# Patient Record
Sex: Male | Born: 1945 | Race: Black or African American | Hispanic: No | Smoking: Former smoker
Health system: Southern US, Community
[De-identification: ages and names within clinical notes are randomized; demographics above are authoritative.]

## PROBLEM LIST (undated history)

## (undated) DIAGNOSIS — R7303 Prediabetes: Secondary | ICD-10-CM

## (undated) DIAGNOSIS — F1910 Other psychoactive substance abuse, uncomplicated: Secondary | ICD-10-CM

## (undated) DIAGNOSIS — R0902 Hypoxemia: Secondary | ICD-10-CM

## (undated) DIAGNOSIS — E785 Hyperlipidemia, unspecified: Secondary | ICD-10-CM

## (undated) DIAGNOSIS — J449 Chronic obstructive pulmonary disease, unspecified: Secondary | ICD-10-CM

## (undated) DIAGNOSIS — I4891 Unspecified atrial fibrillation: Secondary | ICD-10-CM

## (undated) DIAGNOSIS — K219 Gastro-esophageal reflux disease without esophagitis: Secondary | ICD-10-CM

## (undated) DIAGNOSIS — N189 Chronic kidney disease, unspecified: Secondary | ICD-10-CM

## (undated) DIAGNOSIS — D649 Anemia, unspecified: Secondary | ICD-10-CM

## (undated) DIAGNOSIS — J189 Pneumonia, unspecified organism: Secondary | ICD-10-CM

## (undated) DIAGNOSIS — G4700 Insomnia, unspecified: Secondary | ICD-10-CM

## (undated) DIAGNOSIS — I10 Essential (primary) hypertension: Secondary | ICD-10-CM

## (undated) DIAGNOSIS — F419 Anxiety disorder, unspecified: Secondary | ICD-10-CM

## (undated) HISTORY — DX: Hyperlipidemia, unspecified: E78.5

## (undated) HISTORY — DX: Chronic obstructive pulmonary disease, unspecified: J44.9

## (undated) HISTORY — DX: Hypoxemia: R09.02

## (undated) HISTORY — DX: Gastro-esophageal reflux disease without esophagitis: K21.9

## (undated) HISTORY — DX: Chronic kidney disease, unspecified: N18.9

## (undated) HISTORY — DX: Insomnia, unspecified: G47.00

## (undated) HISTORY — DX: Unspecified atrial fibrillation: I48.91

## (undated) HISTORY — DX: Essential (primary) hypertension: I10

---
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
## 1968-07-19 DIAGNOSIS — F1910 Other psychoactive substance abuse, uncomplicated: Secondary | ICD-10-CM

## 1968-07-19 HISTORY — DX: Other psychoactive substance abuse, uncomplicated: F19.10

## 1997-10-24 ENCOUNTER — Encounter: Admission: RE | Admit: 1997-10-24 | Discharge: 1997-10-24 | Payer: Self-pay | Admitting: Sports Medicine

## 1997-11-19 ENCOUNTER — Encounter: Admission: RE | Admit: 1997-11-19 | Discharge: 1997-11-19 | Payer: Self-pay | Admitting: Family Medicine

## 1998-01-23 ENCOUNTER — Encounter: Admission: RE | Admit: 1998-01-23 | Discharge: 1998-01-23 | Payer: Self-pay | Admitting: Family Medicine

## 1998-06-05 ENCOUNTER — Encounter: Admission: RE | Admit: 1998-06-05 | Discharge: 1998-06-05 | Payer: Self-pay | Admitting: Family Medicine

## 2003-05-27 ENCOUNTER — Encounter: Admission: RE | Admit: 2003-05-27 | Discharge: 2003-05-27 | Payer: Self-pay | Admitting: Family Medicine

## 2003-06-24 ENCOUNTER — Ambulatory Visit (HOSPITAL_COMMUNITY): Admission: RE | Admit: 2003-06-24 | Discharge: 2003-06-24 | Payer: Self-pay | Admitting: Family Medicine

## 2003-06-24 ENCOUNTER — Encounter: Admission: RE | Admit: 2003-06-24 | Discharge: 2003-06-24 | Payer: Self-pay | Admitting: Family Medicine

## 2003-07-26 ENCOUNTER — Encounter: Admission: RE | Admit: 2003-07-26 | Discharge: 2003-07-26 | Payer: Self-pay | Admitting: Family Medicine

## 2003-08-26 ENCOUNTER — Encounter: Admission: RE | Admit: 2003-08-26 | Discharge: 2003-08-26 | Payer: Self-pay | Admitting: Family Medicine

## 2003-09-20 ENCOUNTER — Encounter: Payer: Self-pay | Admitting: Cardiology

## 2003-09-20 ENCOUNTER — Ambulatory Visit (HOSPITAL_COMMUNITY): Admission: RE | Admit: 2003-09-20 | Discharge: 2003-09-20 | Payer: Self-pay | Admitting: Family Medicine

## 2004-06-15 ENCOUNTER — Ambulatory Visit: Payer: Self-pay | Admitting: Family Medicine

## 2005-05-24 ENCOUNTER — Ambulatory Visit: Payer: Self-pay | Admitting: Family Medicine

## 2006-05-27 ENCOUNTER — Ambulatory Visit: Payer: Self-pay | Admitting: Family Medicine

## 2006-07-08 ENCOUNTER — Ambulatory Visit: Payer: Self-pay | Admitting: Family Medicine

## 2006-09-15 DIAGNOSIS — J439 Emphysema, unspecified: Secondary | ICD-10-CM | POA: Insufficient documentation

## 2006-09-15 DIAGNOSIS — J449 Chronic obstructive pulmonary disease, unspecified: Secondary | ICD-10-CM | POA: Insufficient documentation

## 2006-09-15 DIAGNOSIS — I10 Essential (primary) hypertension: Secondary | ICD-10-CM | POA: Insufficient documentation

## 2007-06-12 ENCOUNTER — Ambulatory Visit: Payer: Self-pay | Admitting: Family Medicine

## 2007-06-12 LAB — CONVERTED CEMR LAB
ALT: 21 U/L
AST: 25 U/L
Albumin: 4.7 g/dL
Alkaline Phosphatase: 47 U/L
BUN: 18 mg/dL
CO2: 26 meq/L
Calcium: 9.7 mg/dL
Chloride: 102 meq/L
Cholesterol: 136 mg/dL
Creatinine, Ser: 1.15 mg/dL
Glucose, Bld: 94 mg/dL
HDL: 46 mg/dL
LDL Cholesterol: 50 mg/dL
PSA: 3.38 ng/mL
Potassium: 3.9 meq/L
Sodium: 140 meq/L
Total Bilirubin: 0.7 mg/dL
Total CHOL/HDL Ratio: 3
Total Protein: 7.4 g/dL
Triglycerides: 201 mg/dL — ABNORMAL HIGH
VLDL: 40 mg/dL

## 2007-07-25 ENCOUNTER — Ambulatory Visit: Payer: Self-pay | Admitting: Family Medicine

## 2008-05-06 ENCOUNTER — Ambulatory Visit: Payer: Self-pay | Admitting: Family Medicine

## 2008-05-07 ENCOUNTER — Encounter: Payer: Self-pay | Admitting: Family Medicine

## 2008-05-07 LAB — CONVERTED CEMR LAB
BUN: 19 mg/dL (ref 6–23)
CO2: 23 meq/L (ref 19–32)
Calcium: 9.5 mg/dL (ref 8.4–10.5)
Chloride: 101 meq/L (ref 96–112)
Creatinine, Ser: 1.23 mg/dL (ref 0.40–1.50)
Glucose, Bld: 104 mg/dL — ABNORMAL HIGH (ref 70–99)
PSA: 3.95 ng/mL (ref 0.10–4.00)
Potassium: 4.4 meq/L (ref 3.5–5.3)
Sodium: 139 meq/L (ref 135–145)

## 2008-12-05 ENCOUNTER — Telehealth: Payer: Self-pay | Admitting: Family Medicine

## 2008-12-05 ENCOUNTER — Ambulatory Visit: Payer: Self-pay | Admitting: Family Medicine

## 2008-12-23 ENCOUNTER — Telehealth: Payer: Self-pay | Admitting: Family Medicine

## 2008-12-25 ENCOUNTER — Telehealth: Payer: Self-pay | Admitting: *Deleted

## 2008-12-25 ENCOUNTER — Telehealth: Payer: Self-pay | Admitting: Family Medicine

## 2009-01-14 ENCOUNTER — Ambulatory Visit: Payer: Self-pay | Admitting: Family Medicine

## 2009-01-14 DIAGNOSIS — J328 Other chronic sinusitis: Secondary | ICD-10-CM | POA: Insufficient documentation

## 2009-02-18 ENCOUNTER — Telehealth: Payer: Self-pay | Admitting: *Deleted

## 2009-02-28 ENCOUNTER — Ambulatory Visit (HOSPITAL_COMMUNITY): Admission: RE | Admit: 2009-02-28 | Discharge: 2009-02-28 | Payer: Self-pay | Admitting: Family Medicine

## 2009-02-28 ENCOUNTER — Ambulatory Visit: Payer: Self-pay | Admitting: Family Medicine

## 2009-03-03 ENCOUNTER — Telehealth (INDEPENDENT_AMBULATORY_CARE_PROVIDER_SITE_OTHER): Payer: Self-pay | Admitting: *Deleted

## 2009-05-06 ENCOUNTER — Encounter: Payer: Self-pay | Admitting: Family Medicine

## 2009-05-20 ENCOUNTER — Telehealth: Payer: Self-pay | Admitting: Family Medicine

## 2009-05-20 ENCOUNTER — Ambulatory Visit: Payer: Self-pay | Admitting: Family Medicine

## 2009-08-20 ENCOUNTER — Encounter (INDEPENDENT_AMBULATORY_CARE_PROVIDER_SITE_OTHER): Payer: Self-pay | Admitting: *Deleted

## 2009-08-20 ENCOUNTER — Ambulatory Visit: Payer: Self-pay | Admitting: Family Medicine

## 2009-08-20 DIAGNOSIS — R3912 Poor urinary stream: Secondary | ICD-10-CM

## 2009-08-20 DIAGNOSIS — N401 Enlarged prostate with lower urinary tract symptoms: Secondary | ICD-10-CM | POA: Insufficient documentation

## 2009-08-20 DIAGNOSIS — K219 Gastro-esophageal reflux disease without esophagitis: Secondary | ICD-10-CM | POA: Insufficient documentation

## 2009-08-20 LAB — CONVERTED CEMR LAB
BUN: 18 mg/dL (ref 6–23)
CO2: 24 meq/L (ref 19–32)
Calcium: 9.1 mg/dL (ref 8.4–10.5)
Chloride: 99 meq/L (ref 96–112)
Creatinine, Ser: 1.35 mg/dL (ref 0.40–1.50)
Glucose, Bld: 87 mg/dL (ref 70–99)
PSA: 3.63 ng/mL (ref 0.10–4.00)
Potassium: 4 meq/L (ref 3.5–5.3)
Sodium: 138 meq/L (ref 135–145)

## 2009-08-21 ENCOUNTER — Ambulatory Visit: Payer: Self-pay | Admitting: Gastroenterology

## 2009-09-03 ENCOUNTER — Ambulatory Visit: Payer: Self-pay | Admitting: Gastroenterology

## 2009-10-08 ENCOUNTER — Ambulatory Visit: Payer: Self-pay | Admitting: Family Medicine

## 2009-11-27 ENCOUNTER — Ambulatory Visit: Payer: Self-pay | Admitting: Family Medicine

## 2009-11-27 ENCOUNTER — Encounter: Admission: RE | Admit: 2009-11-27 | Discharge: 2009-11-27 | Payer: Self-pay | Admitting: Family Medicine

## 2009-12-03 ENCOUNTER — Ambulatory Visit: Payer: Self-pay | Admitting: Family Medicine

## 2009-12-03 DIAGNOSIS — N529 Male erectile dysfunction, unspecified: Secondary | ICD-10-CM | POA: Insufficient documentation

## 2010-04-17 ENCOUNTER — Ambulatory Visit: Payer: Self-pay | Admitting: Family Medicine

## 2010-04-17 DIAGNOSIS — A5903 Trichomonal cystitis and urethritis: Secondary | ICD-10-CM | POA: Insufficient documentation

## 2010-04-22 LAB — CONVERTED CEMR LAB
Chlamydia, Swab/Urine, PCR: NEGATIVE
GC Probe Amp, Urine: NEGATIVE

## 2010-05-07 ENCOUNTER — Ambulatory Visit: Payer: Self-pay | Admitting: Family Medicine

## 2010-05-07 DIAGNOSIS — J441 Chronic obstructive pulmonary disease with (acute) exacerbation: Secondary | ICD-10-CM

## 2010-05-07 HISTORY — DX: Chronic obstructive pulmonary disease with (acute) exacerbation: J44.1

## 2010-07-24 ENCOUNTER — Encounter (INDEPENDENT_AMBULATORY_CARE_PROVIDER_SITE_OTHER): Payer: Self-pay | Admitting: *Deleted

## 2010-07-24 ENCOUNTER — Ambulatory Visit
Admission: RE | Admit: 2010-07-24 | Discharge: 2010-07-24 | Payer: Self-pay | Source: Home / Self Care | Attending: Family Medicine | Admitting: Family Medicine

## 2010-08-20 NOTE — Assessment & Plan Note (Signed)
Summary: pulse ox for qualification/tlb  Nurse Visit patient came in office with O2 @ 2 L/min. O2 was removed and pulse ox was 94% at rest .  Then patient walked around office a short time and O2 dropped to 86%.. O2 was replaced at 2 L/min and pulse ox  came up to 98%. Theresia Lo RN  July 24, 2010 9:01 AM   Allergies: 1)  Doxycycline  Orders Added: 1)  Pulse Oximetry- [facility] [94760]  Appended Document: pulse ox for qualification/tlb note  and form faxed to [facility].

## 2010-08-20 NOTE — Assessment & Plan Note (Signed)
Summary: cough,tcb   Vital Signs:  Patient profile:   65 year old male Height:      68 inches Weight:      159.4 pounds BMI:     24.32 O2 Sat:      97 % on Room air Temp:     98.2 degrees F oral Pulse rate:   105 / minute BP sitting:   132 / 89  (left arm) Cuff size:   regular  Vitals Entered By: Dedra Skeens CMA, (February 28, 2009 10:06 AM)  O2 Flow:  Room air   CC: cough, Hypertension Management Is Patient Diabetic? No Pain Assessment Patient in pain? yes     Location: right side with cough Intensity: 3   CC:  cough and Hypertension Management.  History of Present Illness: Continued cough.  Has not yet seen ENT.  Reviewed records and no recent CXR.  On ACE but was on for 8 months prior to cough signs.  No smoking for 5 years  Also having more shortness of breath.  Seems like emphysema getting worse.  Hypertension History:      Positive major cardiovascular risk factors include male age 22 years old or older and hypertension.  Negative major cardiovascular risk factors include non-tobacco-user status.    Habits & Providers  Alcohol-Tobacco-Diet     Tobacco Status: quit > 6 months  Current Medications (verified): 1)  Advair Diskus 250-50 Mcg/dose Misc (Fluticasone-Salmeterol) .... Inhale 1 Puff As Directed Twice A Day 2)  Ventolin Hfa 108 (90 Base) Mcg/act Aers (Albuterol Sulfate) .... 2 Puffs Q4h As Needed Rescue Shortness of Breath. 3)  Bayer Aspirin 325 Mg Tabs (Aspirin) .... Take 1 Tablet By Mouth Once A Day 4)  Hydrochlorothiazide 25 Mg Tabs (Hydrochlorothiazide) .... Take 1 Tablet By Mouth Once A Day 5)  Spiriva Handihaler 18 Mcg Caps (Tiotropium Bromide Monohydrate) .Marland Kitchen.. 1 Puff Once A Day 6)  Lisinopril 10 Mg  Tabs (Lisinopril) .... Take 1 Tab By Mouth Daily 7)  Loratadine 10 Mg Tabs (Loratadine) .... One By Mouth Daily  Allergies (verified): 1)  Doxycycline  Past History:  Past medical, surgical, family and social histories (including risk factors)  reviewed, and no changes noted (except as noted below).  Past Medical History: Reviewed history from 07/25/2007 and no changes required. COPD  Past Surgical History: Reviewed history from 09/15/2006 and no changes required. echocardiogram, nl EF - 10/02/2003, EKG - 06/27/2003  Family History: Reviewed history from 09/15/2006 and no changes required. HBP, colon CA, CVA, DM  Social History: Reviewed history from 09/15/2006 and no changes required. 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 Lungs:  Increased AP diameter.  Good air movement.  Scattered exp wheeze.   Impression & Recommendations:  Problem # 1:  COUGH (ICD-786.2) Will get ent referral to check vocal cords.  Also, hold ACE x 1 week to make sure not causing cough. Orders: CXR- 2view (CXR) [facility]- Est Level  3 (56213)  Problem # 2:  HYPERTENSION, BENIGN SYSTEMIC (ICD-401.1) At goal, hold lisinopril His updated medication list for this problem includes:    Hydrochlorothiazide 25 Mg Tabs (Hydrochlorothiazide) .Marland Kitchen... Take 1 tablet by mouth once a day    Lisinopril 10 Mg Tabs (Lisinopril) .Marland Kitchen... Take 1 tab by mouth daily  Problem # 3:  COPD (ICD-496) Assessment: Deteriorated Consider increased Rx.  Await CXR and results of ENT referral His updated medication list for this problem includes:    Advair Diskus 250-50 Mcg/dose Misc (Fluticasone-salmeterol) ..... Inhale 1 puff as directed twice a day    Ventolin Hfa 108 (90 Base) Mcg/act Aers (Albuterol sulfate) .Marland Kitchen... 2 puffs q4h as needed rescue shortness of breath.    Spiriva Handihaler 18 Mcg Caps (Tiotropium bromide monohydrate) .Marland Kitchen... 1 puff once a day  Orders: Pulse Oximetry- [facility] (94760) CXR- 2view (CXR) [facility]- Est  Level  3 (16109)  Complete Medication List: 1)  Advair Diskus 250-50 Mcg/dose Misc (Fluticasone-salmeterol) .... Inhale 1 puff as directed twice a day 2)  Ventolin Hfa 108 (90 Base) Mcg/act Aers (Albuterol sulfate) .... 2 puffs q4h as needed rescue shortness of breath. 3)  Bayer Aspirin 325 Mg Tabs (Aspirin) .... Take 1 tablet by mouth once a day 4)  Hydrochlorothiazide 25 Mg Tabs (Hydrochlorothiazide) .... Take 1 tablet by mouth once a day 5)  Spiriva Handihaler 18 Mcg Caps (Tiotropium bromide monohydrate) .Marland Kitchen.. 1 puff once a day 6)  Lisinopril 10 Mg Tabs (Lisinopril) .... Take 1 tab by mouth daily 7)  Loratadine 10 Mg Tabs (Loratadine) .... One by mouth daily  Hypertension Assessment/Plan:      The patient's hypertensive risk group is category B: At least one risk factor (excluding diabetes) with no target organ damage.  His calculated 10 year risk of coronary heart disease is 7 %.  Today's blood pressure is 132/89.     Patient Instructions: 1)  Make sure you go see the ENT doctor.  2)  I will call with the results of your CXR 3)  Stop taking your lisinopril for one week.  If cough is not any better, start taking it again.  If cough is better, call me.     Prevention & Chronic Care Immunizations   Influenza vaccine: given  (05/06/2008)   Influenza vaccine due: 05/06/2009    Tetanus booster: 07/20/2003: Done.   Tetanus booster due: 07/19/2013    Pneumococcal vaccine: Done.  (06/19/2003)   Pneumococcal vaccine due: None    H. zoster vaccine: Not documented  Colorectal Screening   Hemoccult: Not documented    Colonoscopy: Not documented  Other Screening   PSA: 3.95  (05/06/2008)   PSA due due: 05/06/2009   Smoking status: quit > 6 months  (02/28/2009)  Lipids   Total Cholesterol: 136  (06/12/2007)   LDL: 50  (06/12/2007)   LDL Direct: Not documented   HDL: 46  (06/12/2007)   Triglycerides: 201  (06/12/2007)  Hypertension   Last Blood Pressure: 132 / 89   (02/28/2009)   Serum creatinine: 1.23  (05/06/2008)   Serum potassium 4.4  (05/06/2008)    Hypertension flowsheet reviewed?: Yes   Progress toward BP goal: At goal  Self-Management Support :    Hypertension self-management support: Not documented

## 2010-08-20 NOTE — Letter (Signed)
Summary: *Referral Letter  [facility]  [address]   [place], [place] [address]   Phone: [phone]  Fax: [phone]    05/07/2008  Thank you in advance for agreeing to see my patient:  Michael Robertson [address] [place], [place]  [address]  Phone: [phone]  Reason for Referral: Rising PSA.  PSA=3.38 on 06/12/07.  PSA=3.95 on 05/06/08.  Concerned by rate of rise.  No bladder obstructive signs.  Creat=1.23  Procedures Requested:   Current Medical Problems: 1)  SPECIAL SCREENING MALIGNANT NEOPLASM OF PROSTATE (ICD-V76.44) 2)  HEALTH MAINTENANCE EXAM (ICD-V70.0) 3)  HYPERTENSION, BENIGN SYSTEMIC (ICD-401.1) 4)  COPD (ICD-496)   Current Medications: 1)  ADVAIR DISKUS 250-50 MCG/DOSE MISC (FLUTICASONE-SALMETEROL) Inhale 1 puff as directed twice a day 2)  VENTOLIN HFA 108 (90 BASE) MCG/ACT AERS (ALBUTEROL SULFATE) 2 puffs q4h as needed rescue Shortness of breath. 3)  BAYER ASPIRIN 325 MG TABS (ASPIRIN) Take 1 tablet by mouth once a day 4)  HYDROCHLOROTHIAZIDE 25 MG TABS (HYDROCHLOROTHIAZIDE) Take 1 tablet by mouth once a day 5)  SPIRIVA HANDIHALER 18 MCG CAPS (TIOTROPIUM BROMIDE MONOHYDRATE) 1 puff once a day 6)  LISINOPRIL 10 MG  TABS (LISINOPRIL) Take 1 tab by mouth daily   Thank you again for agreeing to see our patient; please contact us if you have any further questions or need additional information.  Sincerely,  Doralee Albino MD

## 2010-08-20 NOTE — Assessment & Plan Note (Signed)
Summary: f/u and flu shot,df   Vital Signs:  Patient Profile:   65 Years Old Male Weight:      148.2 pounds O2 Sat:      97 % O2 treatment:    Room Air Temp:     97.5 degrees F oral Pulse rate:   70 / minute BP sitting:   159 / 88  (left arm)  Pt. in pain?   no  Vitals Entered By: Arlyss Repress CMA, (May 06, 2008 10:01 AM)              Is Patient Diabetic? No     Serial Vital Signs/Assessments:                                PEF    PreRx  PostRx Time      O2 Sat  O2 Type     L/min  L/min  L/min   By 11:00 AM  88  %   Room air                          THEKLA SLADE CMA,  Last Flu Vaccine:  Fluvax 3+ (06/12/2007 1:42:39 PM) Flu Vaccine Result Date:  05/06/2008 Flu Vaccine Result:  given Flu Vaccine Next Due:  1 yr   Chief Complaint:  1.)discuss disability form 2.) flu shot 3.)refill all meds 4.)f/up inhaled chemicals 2 weeks ago.  History of Present Illness: Prevention.  Needs shingles shot and Colon cancer screen.  He will probably get through [place] and give me copies of results  Now 100% disabled 2nd to COPD  2 weeks ago was exposed to chlorax and toilet bowel cleaner.  Symptomatically back to normal  Still quit smoking  BP high today.  Has been running a bit high when checks at home.  Will add lisinopril      Current Allergies: No known allergies       Physical Exam  General:     Well-developed,well-nourished,in no acute distress; alert,appropriate and cooperative throughout examination BP noted Lungs:     prolonged exp phase.  No wheez Heart:     Normal rate and regular rhythm. S1 and S2 normal without gallop, murmur, click, rub or other extra sounds. Extremities:     No clubbing, cyanosis, edema, or deformity noted with normal full range of motion of all joints.      Impression & Recommendations:  Problem # 1:  HYPERTENSION, BENIGN SYSTEMIC (ICD-401.1) Assessment: Deteriorated Add lisinopril His updated medication list for this problem  includes:    Hydrochlorothiazide 25 Mg Tabs (Hydrochlorothiazide) .Marland Kitchen... Take 1 tablet by mouth once a day    Lisinopril 10 Mg Tabs (Lisinopril) .Marland Kitchen... Take 1 tab by mouth daily  Orders: [facility]- Est  Level 4 (16109) Basic Met-[facility] ([address]454-09811)   Problem # 2:  COPD (ICD-496)  His updated medication list for this problem includes:    Advair Diskus 250-50 Mcg/dose Misc (Fluticasone-salmeterol) ..... Inhale 1 puff as directed twice a day    Ventolin Hfa 108 (90 Base) Mcg/act Aers (Albuterol sulfate) .Marland Kitchen... 2 puffs q4h as needed rescue shortness of breath.    Spiriva Handihaler 18 Mcg Caps (Tiotropium bromide monohydrate) .Marland Kitchen... 1 puff once a day  Orders: [facility]- Est  Level 4 (99214) Pulse Oximetry- [facility] (94760)   Problem # 3:  HEALTH MAINTENANCE EXAM (ICD-V70.0)  Orders: [facility]- Est  Level 4 (91478) PSA-[facility] (  [phone])   Complete Medication List: 1)  Advair Diskus 250-50 Mcg/dose Misc (Fluticasone-salmeterol) .... Inhale 1 puff as directed twice a day 2)  Ventolin Hfa 108 (90 Base) Mcg/act Aers (Albuterol sulfate) .... 2 puffs q4h as needed rescue shortness of breath. 3)  Bayer Aspirin 325 Mg Tabs (Aspirin) .... Take 1 tablet by mouth once a day 4)  Hydrochlorothiazide 25 Mg Tabs (Hydrochlorothiazide) .... Take 1 tablet by mouth once a day 5)  Spiriva Handihaler 18 Mcg Caps (Tiotropium bromide monohydrate) .Marland Kitchen.. 1 puff once a day 6)  Lisinopril 10 Mg Tabs (Lisinopril) .... Take 1 tab by mouth daily  Other Orders: Influenza Vaccine NON MCR (30865)   Patient Instructions: 1)  Check with insurance/VA about two preention things 2)  1. Zostavax=Shingles vaccine 3)  2. Colon cancer screen.  4)  3. You received the regular flu shot today.  Still need the H1N1 shot   Prescriptions: LISINOPRIL 10 MG  TABS (LISINOPRIL) Take 1 tab by mouth daily  #30 x 12   Entered and Authorized by:   Doralee Albino MD   Signed by:   Doralee Albino MD on 05/06/2008   Method used:   Electronically to         [facility]* (retail)       [address]       [place], [place]  [address]       Ph: [phone]       Fax: [phone]   RxID:   [id number] SPIRIVA HANDIHALER 18 MCG CAPS (TIOTROPIUM BROMIDE MONOHYDRATE) 1 puff once a day  #1 x 12   Entered and Authorized by:   Doralee Albino MD   Signed by:   Doralee Albino MD on 05/06/2008   Method used:   Electronically to        [facility]* (retail)       [address]       [place], [place]  [address]       Ph: [phone]       Fax: [phone]   RxID:   [id number] HYDROCHLOROTHIAZIDE 25 MG TABS (HYDROCHLOROTHIAZIDE) Take 1 tablet by mouth once a day  #30 x 12   Entered and Authorized by:   Doralee Albino MD   Signed by:   Doralee Albino MD on 05/06/2008   Method used:   Electronically to        [facility]* (retail)       [address]       [place], [place]  [address]       Ph: [phone]       Fax: [phone]   RxID:   [id number] VENTOLIN HFA 108 (90 BASE) MCG/ACT AERS (ALBUTEROL SULFATE) 2 puffs q4h as needed rescue Shortness of breath.  #1 x 12   Entered and Authorized by:   Doralee Albino MD   Signed by:   Doralee Albino MD on 05/06/2008   Method used:   Electronically to        [facility]* (retail)       [address]       [place], [place]  [address]       Ph: [phone]       Fax: [phone]   RxID:   [id number] ADVAIR DISKUS 250-50 MCG/DOSE MISC (FLUTICASONE-SALMETEROL) Inhale 1 puff as directed twice a day  #1 x 12   Entered and Authorized by:   Doralee Albino MD   Signed by:   Doralee Albino MD on 05/06/2008  Method used:   Electronically to        [facility]* (retail)       [address]       [place], [place]  [address]       Ph: [phone]       Fax: [phone]   RxID:   [id number]  ]  Influenza Vaccine    Vaccine Type: Fluvax Non-MCR    Site: left deltoid    Mfr: GlaxoSmithKline    Dose: 0.25 ml    Route: IM    Given by: Arlyss Repress CMA,    Exp.  Date: 01/15/2009    Lot #: [id number]    VIS given: 02/09/07 version given May 06, 2008.  Flu Vaccine Consent Questions    Do you have a history of severe allergic reactions to this vaccine? no    Any prior history of allergic reactions to egg and/or gelatin? no    Do you have a sensitivity to the preservative Thimersol? no    Do you have a past history of Guillan-Barre Syndrome? no    Do you currently have an acute febrile illness? no    Have you ever had a severe reaction to latex? no    Vaccine information given and explained to patient? yes  Vaccine Consent Questions for Influenza    Do you have a history of severe allergic reactions to this vaccine? no    Any prior history of allergic reactions to egg and/or gelatin? no    Do you currently have an acute febrile illness? no    Have you ever had a severe reaction to latex? no    Patient is moderately or severely ill? no    Vaccine information given and explained to patient? yes

## 2010-08-20 NOTE — Progress Notes (Signed)
Summary: ENT appt  Phone Note Call from Patient Call back at Home Phone [phone]   Caller: Patient Summary of Call: is asking about when his ENT appt is. Initial call taken by: De Nurse,  March 03, 2009 8:44 AM  Follow-up for Phone Call        Lm on vm advising ENT appt can not be scheduled with [facility] until insurance is verified and will pay for visit.  Insurance card sent via fax to [facility] attn: Marcelino Duster to verify insurance will pay. She will contact me to setup appt and pt will be notified as soon as appt has been set. Follow-up by: Dedra Skeens CMA,,  March 03, 2009 3:50 PM

## 2010-08-20 NOTE — Assessment & Plan Note (Signed)
Summary: cough,df   Vital Signs:  Patient profile:   65 year old male Height:      68 inches Weight:      160.7 pounds BMI:     24.52 O2 Sat:      95 % on 1 L/min Temp:     98.1 degrees F oral Pulse rate:   98 / minute BP sitting:   134 / 80  (left arm) Cuff size:   regular  Vitals Entered By: Gladstone Pih (October 08, 2009 1:29 PM)  O2 Flow:  1 L/min  Serial Vital Signs/Assessments:                                PEF    PreRx  PostRx Time      O2 Sat  O2 Type     L/min  L/min  L/min   By 1:59 PM   83  %   Room air                          Gladstone Pih  Comments: 1:59 PM after exercise By: Gladstone Pih   CC: F/U COPD and cough Is Patient Diabetic? No Pain Assessment Patient in pain? no        CC:  F/U COPD and cough.  History of Present Illness: Cough.  Seems to be related to reflux.  Noted indigestion and urping brings on cough - especially after meals and certain foods (e.g. caffiene containing.)  On omeprozole - which controls the heart burn but he still feels food regurgitating.    Breathing seems generally worse.  He particularly struggles in the mornings and the evenings.  No able to be as active as he used to.  He has checked at home pulse ox and during times he is SOB his pulse ox will drop to 85%.    Habits & Providers  Alcohol-Tobacco-Diet     Tobacco Status: quit     Tobacco Counseling: to quit use of tobacco products     Year Quit: 2006  Current Medications (verified): 1)  Advair Diskus 250-50 Mcg/dose Misc (Fluticasone-Salmeterol) .... Inhale 1 Puff As Directed Twice A Day 2)  Ventolin Hfa 108 (90 Base) Mcg/act Aers (Albuterol Sulfate) .... 2 Puffs Q4h As Needed Rescue Shortness of Breath. 3)  Bayer Aspirin 325 Mg Tabs (Aspirin) .... Take 1 Tablet By Mouth Once A Day 4)  Hydrochlorothiazide 25 Mg Tabs (Hydrochlorothiazide) .... Take 1 Tablet By Mouth Once A Day 5)  Spiriva Handihaler 18 Mcg Caps (Tiotropium Bromide Monohydrate) .Marland Kitchen.. 1 Puff Once A  Day 6)  Lisinopril 10 Mg  Tabs (Lisinopril) .... Take 1 Tab By Mouth Daily 7)  Omeprazole 40 Mg Cpdr (Omeprazole) .... One By Mouth Daily 8)  Tamsulosin Hcl 0.4 Mg Caps (Tamsulosin Hcl) .... One By Mouth Daily (For Prostate) 9)  Metoclopramide Hcl 10 Mg Tabs (Metoclopramide Hcl) .... One By Mouth Qac and Qhs 10)  Oxygen .... 2 Liters Per Minute Per Nasal Cannula  Allergies (verified): 1)  Doxycycline  Past History:  Past medical, surgical, family and social histories (including risk factors) reviewed, and no changes noted (except as noted below).  Past Medical History: Reviewed history from 07/25/2007 and no changes required. COPD  Past Surgical History: Reviewed history from 09/15/2006 and no changes required. echocardiogram, nl EF - 10/02/2003, EKG - 06/27/2003  Family History: Reviewed history from 09/15/2006 and  no changes required. HBP, colon CA, CVA, DM  Social History: Reviewed history from 09/15/2006 and no changes required. 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    Physical Exam  General:  Well-developed,well-nourished,in no acute distress; alert,appropriate and cooperative throughout examination.  Not short of breath at rest.    PULSE OX POST EXERCISE DROPPED TO 83%  Mouth:  Oral mucosa and oropharynx without lesions or exudates.  Teeth in good repair. Neck:  No deformities, masses, or tenderness noted. Lungs:  No wheeze or crackles.  Increase AP diameter and decreased vital capacity. Heart:  Normal rate and regular rhythm. S1 and S2 normal without gallop, murmur, click, rub or other extra sounds. Abdomen:  Bowel sounds positive,abdomen soft and non-tender without masses, organomegaly or hernias noted. Extremities:  no edema   Impression & Recommendations:  Problem # 1:  COPD  (ICD-496) Assessment Deteriorated Needs home O2 for low pulse ox with exercise. His updated medication list for this problem includes:    Advair Diskus 250-50 Mcg/dose Misc (Fluticasone-salmeterol) ..... Inhale 1 puff as directed twice a day    Ventolin Hfa 108 (90 Base) Mcg/act Aers (Albuterol sulfate) .Marland Kitchen... 2 puffs q4h as needed rescue shortness of breath.    Spiriva Handihaler 18 Mcg Caps (Tiotropium bromide monohydrate) .Marland Kitchen... 1 puff once a day  Orders: Pulse Oximetry- [facility] (94760) [facility]- Est  Level 4 (99214)  Problem # 2:  G E R D (ICD-530.81) Add reglan His updated medication list for this problem includes:    Omeprazole 40 Mg Cpdr (Omeprazole) ..... One by mouth daily  Orders: [facility]- Est  Level 4 (96045)  Problem # 3:  HYPERTENSION, BENIGN SYSTEMIC (ICD-401.1) Assessment: Improved continue same rx His updated medication list for this problem includes:    Hydrochlorothiazide 25 Mg Tabs (Hydrochlorothiazide) .Marland Kitchen... Take 1 tablet by mouth once a day    Lisinopril 10 Mg Tabs (Lisinopril) .Marland Kitchen... Take 1 tab by mouth daily  Orders: [facility]- Est  Level 4 (99214)  Complete Medication List: 1)  Advair Diskus 250-50 Mcg/dose Misc (Fluticasone-salmeterol) .... Inhale 1 puff as directed twice a day 2)  Ventolin Hfa 108 (90 Base) Mcg/act Aers (Albuterol sulfate) .... 2 puffs q4h as needed rescue shortness of breath. 3)  Bayer Aspirin 325 Mg Tabs (Aspirin) .... Take 1 tablet by mouth once a day 4)  Hydrochlorothiazide 25 Mg Tabs (Hydrochlorothiazide) .... Take 1 tablet by mouth once a day 5)  Spiriva Handihaler 18 Mcg Caps (Tiotropium bromide monohydrate) .Marland Kitchen.. 1 puff once a day 6)  Lisinopril 10 Mg Tabs (Lisinopril) .... Take 1 tab by mouth daily 7)  Omeprazole 40 Mg Cpdr (Omeprazole) .... One by mouth daily 8)  Tamsulosin Hcl 0.4 Mg Caps (Tamsulosin hcl) .... One by mouth daily (for prostate) 9)  Metoclopramide Hcl 10 Mg Tabs (Metoclopramide hcl) .... One by mouth qac and qhs 10)  Oxygen  .... 2  liters per minute per nasal cannula  Other Orders: Pneumococcal Vaccine (40981) Admin 1st Vaccine (19147)  Patient Instructions: 1)  The new prescription is for reflux.  Start taking it four times a day.  Try to wean yourself down to only twice a day. 2)  Get oxygen prescription through [facility]. 3)  See me in three months if you are doing well - sooner if doing poorly. Prescriptions: METOCLOPRAMIDE HCL 10 MG  TABS (METOCLOPRAMIDE HCL) one by mouth qac and qhs  #120 x 12   Entered and Authorized by:   Doralee Albino MD   Signed by:   Doralee Albino MD on 10/08/2009   Method used:   Electronically to        [facility] [phone]* (retail)       [address]       [place], [place]  [address]       Ph: [phone]       Fax: [phone]   RxID:   [id number]    Prevention & Chronic Care Immunizations   Influenza vaccine: Fluvax Non-MCR  (05/20/2009)   Influenza vaccine due: 03/19/2010    Tetanus booster: 07/20/2003: Done.   Tetanus booster due: 07/19/2013    Pneumococcal vaccine: Pneumovax  (10/08/2009)   Pneumococcal vaccine due: None    H. zoster vaccine: Not documented  Colorectal Screening   Hemoccult: Not documented   Hemoccult action/deferral: Not indicated  (08/20/2009)    Colonoscopy: DONE  (09/03/2009)   Colonoscopy action/deferral: GI Referral  (08/20/2009)   Colonoscopy due: 08/2014  Other Screening   PSA: 3.63  (08/20/2009)   PSA action/deferral: Discussed-PSA requested  (08/20/2009)   PSA due due: 05/06/2009   Smoking status: quit  (10/08/2009)  Lipids   Total Cholesterol: 136  (06/12/2007)   LDL: 50  (06/12/2007)   LDL Direct: Not documented   HDL: 46  (06/12/2007)   Triglycerides: 201  (06/12/2007)  Hypertension   Last Blood Pressure: 134 / 80  (10/08/2009)   Serum creatinine: 1.35  (08/20/2009)   BMP action: Ordered   Serum potassium 4.0  (08/20/2009)    Hypertension flowsheet reviewed?: Yes   Progress toward BP goal:  At goal  Self-Management Support :    Hypertension self-management support: Not documented   Nursing Instructions: Give Pneumovax today     Orders Added: 1)  Pulse Oximetry- [facility] [94760] 2)  [facility]- Est  Level 4 [24401] 3)  Pneumococcal Vaccine [90732] 4)  Admin 1st Vaccine [02725]    Immunizations Administered:  Pneumonia Vaccine:    Vaccine Type: Pneumovax    Site: right deltoid    Mfr: Merck    Dose: 0.5 ml    Route: IM    Given by: Gladstone Pih    Exp. Date: 04/02/2011    Lot #: [id number]    VIS given: 02/14/96 version given October 08, 2009.    Physician counseled: yes

## 2010-08-20 NOTE — Miscellaneous (Signed)
Summary: [facility]  Clinical Lists Changes  Medications: Added new medication of MOVIPREP 100 GM  SOLR (PEG-KCL-NACL-NASULF-NA ASC-C) As per prep instructions. - Signed Rx of MOVIPREP 100 GM  SOLR (PEG-KCL-NACL-NASULF-NA ASC-C) As per prep instructions.;  #1 x 0;  Signed;  Entered by: Ezra Sites RN;  Authorized by: Mardella Layman MD [facility];  Method used: Electronically to [facility] [phone]*, [address], [place], [place]  [address], Ph: [phone], Fax: [phone] Observations: Added new observation of ALLERGY REV: Done (08/21/2009 13:19)    Prescriptions: MOVIPREP 100 GM  SOLR (PEG-KCL-NACL-NASULF-NA ASC-C) As per prep instructions.  #1 x 0   Entered by:   Ezra Sites RN   Authorized by:   Mardella Layman MD [facility]   Signed by:   Ezra Sites RN on 08/21/2009   Method used:   Electronically to        [facility] [phone]* (retail)       [address]       [place], [place]  [address]       Ph: [phone]       Fax: [phone]   RxID:   [id number]

## 2010-08-20 NOTE — Letter (Signed)
Summary: [facility] Instructions  [facility]  [address] [place], [place] [address]   Phone: [phone]  Fax: [phone]       Michael Robertson    1945-09-19    MRN: [id number]        Procedure Day Dorna Bloom:  Wednesday  09/03/09     Arrival Time:  10:00am     Procedure Time:  11:00am     Location of Procedure:                    _X _  [facility] (4th Floor)                       PREPARATION FOR COLONOSCOPY WITH MOVIPREP   Starting 5 days prior to your procedure  Friday 02/11  do not eat nuts, seeds, popcorn, corn, beans, peas,  salads, or any raw vegetables.  Do not take any fiber supplements (e.g. Metamucil, Citrucel, and Benefiber).  THE DAY BEFORE YOUR PROCEDURE         DATE:  02/15  DAY: Tuesday  1.  Drink clear liquids the entire day-NO SOLID FOOD  2.  Do not drink anything colored red or purple.  Avoid juices with pulp.  No orange juice.  3.  Drink at least 64 oz. (8 glasses) of fluid/clear liquids during the day to prevent dehydration and help the prep work efficiently.  CLEAR LIQUIDS INCLUDE: Water Jello Ice Popsicles Tea (sugar ok, no milk/cream) Powdered fruit flavored drinks Coffee (sugar ok, no milk/cream) Gatorade Juice: apple, white grape, white cranberry  Lemonade Clear bullion, consomm, broth Carbonated beverages (any kind) Strained chicken noodle soup Hard Candy                             4.  In the morning, mix first dose of MoviPrep solution:    Empty 1 Pouch A and 1 Pouch B into the disposable container    Add lukewarm drinking water to the top line of the container. Mix to dissolve    Refrigerate (mixed solution should be used within 24 hrs)  5.  Begin drinking the prep at 5:00 p.m. The MoviPrep container is divided by 4 marks.   Every 15 minutes drink the solution down to the next mark (approximately 8 oz) until the full liter is complete.   6.  Follow completed prep with 16 oz of clear liquid of your choice  (Nothing red or purple).  Continue to drink clear liquids until bedtime.  7.  Before going to bed, mix second dose of MoviPrep solution:    Empty 1 Pouch A and 1 Pouch B into the disposable container    Add lukewarm drinking water to the top line of the container. Mix to dissolve    Refrigerate  THE DAY OF YOUR PROCEDURE      DATE:  02/16  DAY: Wednesday  Beginning at  6:00 a.m. (5 hours before procedure):         1. Every 15 minutes, drink the solution down to the next mark (approx 8 oz) until the full liter is complete.  2. Follow completed prep with 16 oz. of clear liquid of your choice.    3. You may drink clear liquids until  9:00am  (2 HOURS BEFORE PROCEDURE).   MEDICATION INSTRUCTIONS  Unless otherwise instructed, you should take regular prescription medications with a small sip of water  as early as possible the morning of your procedure.    Additional medication instructions: Hold HCTZ morning of procedure         OTHER INSTRUCTIONS  You will need a responsible adult at least 66 years of age to accompany you and drive you home.   This person must remain in the waiting room during your procedure.  Wear loose fitting clothing that is easily removed.  Leave jewelry and other valuables at home.  However, you may wish to bring a book to read or  an iPod/MP3 player to listen to music as you wait for your procedure to start.  Remove all body piercing jewelry and leave at home.  Total time from sign-in until discharge is approximately 2-3 hours.  You should go home directly after your procedure and rest.  You can resume normal activities the  day after your procedure.  The day of your procedure you should not:   Drive   Make legal decisions   Operate machinery   Drink alcohol   Return to work  You will receive specific instructions about eating, activities and medications before you leave.    The above instructions have been reviewed and explained  to me by   Ezra Sites RN  August 21, 2009 1:58 PM     I fully understand and can verbalize these instructions _____________________________ Date _________

## 2010-08-20 NOTE — Procedures (Signed)
Summary: Colonoscopy  Patient: Michael Robertson Note: All result statuses are Final unless otherwise noted.  Tests: (1) Colonoscopy (COL)   COL Colonoscopy           DONE (C)     [facility]     [address]     [place], [place]  [address]           COLONOSCOPY PROCEDURE REPORT           PATIENT:  Michael Robertson  MR#:  [id number]     BIRTHDATE:  [birth year], 63 yrs. old  GENDER:  male           ENDOSCOPIST:  Vania Rea. Jarold Motto, MD, [facility]     Referred by:  Doralee Albino, M.D.           PROCEDURE DATE:  09/03/2009     PROCEDURE:  Higher-risk screening colonoscopy G0105           ASA CLASS:  Class II     INDICATIONS:  family history of colon cancer, constipation           MEDICATIONS:   Fentanyl 50 mcg IV, Versed 6 mg IV           DESCRIPTION OF PROCEDURE:   After the risks benefits and     alternatives of the procedure were thoroughly explained, informed     consent was obtained.  Digital rectal exam was performed and     revealed no abnormalities.   The LB [id number] endoscope     was introduced through the anus and advanced to the cecum, which     was identified by both the appendix and ileocecal valve, without     limitations.  The quality of the prep was excellent, using     MoviPrep.  The instrument was then slowly withdrawn as the colon     was fully examined.     <<PROCEDUREIMAGES>>           FINDINGS:  No polyps or cancers were seen.  This was otherwise a     normal examination of the colon. RECTUM CLOSELY EXAMENED AND     APPEARS NORMAL.   Retroflexed views in the rectum revealed no     abnormalities.    The scope was then withdrawn from the patient     and the procedure completed.           COMPLICATIONS:  None           ENDOSCOPIC IMPRESSION:     1) No polyps or cancers     2) Otherwise normal examination     RECOMMENDATIONS:     1) High fiber diet with liberal fluid intake.           REPEAT EXAM:  In 5 year(s) for Colonoscopy.        ______________________________     Vania Rea. Jarold Motto, MD, [facility]           CC:           n.     REVISED:  09/03/2009 03:18 PM     eSIGNED:   Vania Rea. Eleasha Cataldo at 09/03/2009 03:18 PM           Mindi Junker, [id number]  Note: An exclamation mark (!) indicates a result that was not dispersed into the flowsheet. Document Creation Date: 09/03/2009 3:18 PM _______________________________________________________________________  (1) Order result status: Final Collection or observation date-time: 09/03/2009 11:41 Requested date-time:  Receipt date-time:  Reported date-time:  Referring Physician:   Ordering Physician: Sheryn Bison [phone]) Specimen Source:  Source: Launa Grill Order Number: [id number] Lab site:   Appended Document: Colonoscopy    Clinical Lists Changes  Observations: Added new observation of COLONNXTDUE: 08/2014 (09/03/2009 15:20)      Appended Document: Colonoscopy     Procedures Next Due Date:    Colonoscopy: 09/2014

## 2010-08-20 NOTE — Progress Notes (Signed)
Summary: Triage  Phone Note Call from Patient Call back at [facility] Phone [phone]   Summary of Call: has real bad uncontrolable cough wants to be seen asap. Initial call taken by: Clydell Hakim,  Dec 05, 2008 11:44 AM  Follow-up for Phone Call        c/o cough x 2 weeks. wants to be seen today. will see Dr. Karn Pickler at 1:30 Follow-up by: Golden Circle RN,  Dec 05, 2008 11:49 AM  Additional Follow-up for Phone Call Additional follow up Details #1::        noted and agree Additional Follow-up by: Doralee Albino MD,  Dec 06, 2008 8:57 AM

## 2010-08-20 NOTE — Assessment & Plan Note (Signed)
Summary: cough,df   Vital Signs:  Patient profile:   65 year old male Height:      68 inches Weight:      158 pounds BMI:     24.11 BSA:     1.85 Temp:     97.8 degrees F Pulse rate:   108 / minute BP sitting:   150 / 82  Vitals Entered By: Jone Baseman CMA (January 14, 2009 8:46 AM) CC: cough x 2 months Pain Assessment Patient in pain? no        CC:  cough x 2 months.  History of Present Illness: Cough x 2 months.  Hx of springtime allergies.  Allegra helped now out.  Feels like cough is tickle in throat.  Also has posterior sinus drainage.  Cough worse at night.  Occaisional hoarseness after bad coughing spell.  Quit tobacco 5 years ago  Habits & Providers  Alcohol-Tobacco-Diet     Tobacco Status: quit     Year Quit: 5 years  Allergies: 1)  Doxycycline  Social History: Smoking Status:  quit  Physical Exam  General:  Well-developed,well-nourished,in no acute distress; alert,appropriate and cooperative throughout examination Mouth:  Oral mucosa and oropharynx without lesions or exudates.  Teeth are carrious. Neck:  No deformities, masses, or tenderness noted. 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 1 tab by mouth two times a day as needed cough His updated medication list for this problem includes:    Augmentin 875-125 Mg Tabs (Amoxicillin-pot clavulanate) .Marland Kitchen... 1 by mouth 2 times daily  Orders: [facility]- Est Level  3 (86578)  Complete Medication List: 1)  Advair Diskus 250-50 Mcg/dose Misc (Fluticasone-salmeterol) .... Inhale 1 puff as directed twice a day 2)  Ventolin Hfa 108 (90 Base) Mcg/act Aers (Albuterol sulfate) .... 2 puffs q4h as needed rescue shortness of breath. 3)  Bayer Aspirin 325  Mg Tabs (Aspirin) .... Take 1 tablet by mouth once a day 4)  Hydrochlorothiazide 25 Mg Tabs (Hydrochlorothiazide) .... Take 1 tablet by mouth once a day 5)  Spiriva Handihaler 18 Mcg Caps (Tiotropium bromide monohydrate) .Marland Kitchen.. 1 puff once a day 6)  Lisinopril 10 Mg Tabs (Lisinopril) .... Take 1 tab by mouth daily 7)  Loratadine 10 Mg Tabs (Loratadine) .... One by mouth daily 8)  Augmentin 875-125 Mg Tabs (Amoxicillin-pot clavulanate) .Marland Kitchen.. 1 by mouth 2 times daily  Patient Instructions: 1)  Two new meds 2)  1. Is antibiotic for sinus infection 3)  2. I want you taking an antihistamine daily for allergies. 4)  3. If this doesn't clear up the cough, you will need to see an Ear, Nose and Throat doctor.   Prescriptions: AUGMENTIN 875-125 MG TABS (AMOXICILLIN-POT CLAVULANATE) 1 by mouth 2 times daily  #20 x 3   Entered and Authorized by:   Doralee Albino MD   Signed by:   Doralee Albino MD on 01/14/2009   Method used:   Electronically to        [facility] [phone]* (retail)       [address]       [place], [place]  [address]       Ph: [phone]       Fax: [phone]   RxID:  [id number] LORATADINE 10 MG TABS (LORATADINE) one by mouth daily  #30 x 12   Entered and Authorized by:   Doralee Albino MD   Signed by:   Doralee Albino MD on 01/14/2009   Method used:   Electronically to        [facility] [phone]* (retail)       [address]       [place], [place]  [address]       Ph: [phone]       Fax: [phone]   RxID:   [id number]   Flex Sig Next Due:  Not Indicated Last PSA Result:  3.95 (05/06/2008 8:18:00 PM) PSA Result Date:  05/06/2008 PSA Result:  3.95 PSA Next Due:  1 yr

## 2010-08-20 NOTE — Assessment & Plan Note (Signed)
Summary: f/u eo   Vital Signs:  Patient profile:   65 year old male Height:      68 inches Weight:      163.5 pounds BMI:     24.95 Temp:     97.7 degrees F oral Pulse rate:   112 / minute BP sitting:   128 / 79  (left arm) Cuff size:   regular  Vitals Entered By: Gladstone Pih (Dec 03, 2009 1:43 PM) CC: F/U Is Patient Diabetic? No Pain Assessment Patient in pain? no        Primary Care Provider:  Doralee Albino MD  CC:  F/U.  History of Present Illness: Recovering nicely from lung infection.  Still has some hoarseness.  Knows to be rechecked if prolonged hoarseness. Also has questions about ED.  Would like to try meds.  Knows to wear O2 during intercourse.  Habits & Providers  Alcohol-Tobacco-Diet     Tobacco Status: never  Current Medications (verified): 1)  Advair Diskus 250-50 Mcg/dose Misc (Fluticasone-Salmeterol) .... Inhale 1 Puff As Directed Twice A Day 2)  Ventolin Hfa 108 (90 Base) Mcg/act Aers (Albuterol Sulfate) .... 2 Puffs Q4h As Needed Rescue Shortness of Breath. 3)  Bayer Aspirin 325 Mg Tabs (Aspirin) .... Take 1 Tablet By Mouth Once A Day 4)  Hydrochlorothiazide 25 Mg Tabs (Hydrochlorothiazide) .... Take 1 Tablet By Mouth Once A Day 5)  Spiriva Handihaler 18 Mcg Caps (Tiotropium Bromide Monohydrate) .Marland Kitchen.. 1 Puff Once A Day 6)  Lisinopril 10 Mg  Tabs (Lisinopril) .... Take 1 Tab By Mouth Daily 7)  Omeprazole 40 Mg Cpdr (Omeprazole) .... One By Mouth Daily 8)  Tamsulosin Hcl 0.4 Mg Caps (Tamsulosin Hcl) .... One By Mouth Daily (For Prostate) 9)  Metoclopramide Hcl 10 Mg Tabs (Metoclopramide Hcl) .... One By Mouth Qac and Qhs 10)  Oxygen .... 2 Liters Per Minute Per Nasal Cannula 11)  Viagra 50 Mg Tabs (Sildenafil Citrate) .... One By Mouth 1/2 To 1 Hour Prior To Activity  Allergies (verified): 1)  Doxycycline  Past History:  Past medical, surgical, family and social histories (including risk factors) reviewed, and no changes noted (except as noted  below).  Past Medical History: Reviewed history from 07/25/2007 and no changes required. COPD  Past Surgical History: Reviewed history from 09/15/2006 and no changes required. echocardiogram, nl EF - 10/02/2003, EKG - 06/27/2003  Family History: Reviewed history from 09/15/2006 and no changes required. HBP, colon CA, CVA, DM  Social History: Reviewed history from 09/15/2006 and no changes required. 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  His baseline Heart:  Normal rate and regular rhythm. S1 and S2 normal without gallop, murmur, click, rub or other extra sounds.   Impression & Recommendations:  Problem # 1:  COPD (ICD-496) Assessment Improved  His updated medication list for this problem includes:    Advair Diskus 250-50 Mcg/dose Misc (Fluticasone-salmeterol) ..... Inhale 1 puff as directed twice a day    Ventolin Hfa 108 (90 Base) Mcg/act Aers (Albuterol sulfate) .Marland Kitchen... 2 puffs q4h as needed rescue shortness of breath.    Spiriva Handihaler 18 Mcg Caps (Tiotropium bromide monohydrate) .Marland Kitchen... 1 puff once a day  Problem # 2:  ERECTILE DYSFUNCTION (VWU-981.19)  His updated  medication list for this problem includes:    Viagra 50 Mg Tabs (Sildenafil citrate) ..... One by mouth 1/2 to 1 hour prior to activity  Complete Medication List: 1)  Advair Diskus 250-50 Mcg/dose Misc (Fluticasone-salmeterol) .... Inhale 1 puff as directed twice a day 2)  Ventolin Hfa 108 (90 Base) Mcg/act Aers (Albuterol sulfate) .... 2 puffs q4h as needed rescue shortness of breath. 3)  Bayer Aspirin 325 Mg Tabs (Aspirin) .... Take 1 tablet by mouth once a day 4)  Hydrochlorothiazide 25 Mg Tabs (Hydrochlorothiazide) .... Take  1 tablet by mouth once a day 5)  Spiriva Handihaler 18 Mcg Caps (Tiotropium bromide monohydrate) .Marland Kitchen.. 1 puff once a day 6)  Lisinopril 10 Mg Tabs (Lisinopril) .... Take 1 tab by mouth daily 7)  Omeprazole 40 Mg Cpdr (Omeprazole) .... One by mouth daily 8)  Tamsulosin Hcl 0.4 Mg Caps (Tamsulosin hcl) .... One by mouth daily (for prostate) 9)  Metoclopramide Hcl 10 Mg Tabs (Metoclopramide hcl) .... One by mouth qac and qhs 10)  Oxygen  .... 2 liters per minute per nasal cannula 11)  Viagra 50 Mg Tabs (Sildenafil citrate) .... One by mouth 1/2 to 1 hour prior to activity  Patient Instructions: 1)  Please schedule a follow-up appointment in 4 months .  2)  Call to let me know how the new medicine works. Prescriptions: VIAGRA 50 MG TABS (SILDENAFIL CITRATE) one by mouth 1/2 to 1 hour prior to activity  #6 x 12   Entered and Authorized by:   Doralee Albino MD   Signed by:   Doralee Albino MD on 12/03/2009   Method used:   Electronically to        [facility] [phone]* (retail)       [address]       [place], [place]  [address]       Ph: [phone]       Fax: [phone]   RxID:   [id number]    Prevention & Chronic Care Immunizations   Influenza vaccine: Fluvax Non-MCR  (05/20/2009)   Influenza vaccine due: 03/19/2010    Tetanus booster: 07/20/2003: Done.   Tetanus booster due: 07/19/2013    Pneumococcal vaccine: Pneumovax  (10/08/2009)   Pneumococcal vaccine due: None    H. zoster vaccine: Not documented  Colorectal Screening   Hemoccult: Not documented   Hemoccult action/deferral: Not indicated  (08/20/2009)    Colonoscopy: DONE  (09/03/2009)   Colonoscopy action/deferral: GI Referral  (08/20/2009)   Colonoscopy due: 08/2014  Other Screening   PSA: 3.63  (08/20/2009)   PSA action/deferral: Discussed-PSA requested  (08/20/2009)   PSA due due: 05/06/2009   Smoking status: never  (12/03/2009)  Lipids   Total Cholesterol: 136  (06/12/2007)   LDL: 50   (06/12/2007)   LDL Direct: Not documented   HDL: 46  (06/12/2007)   Triglycerides: 201  (06/12/2007)  Hypertension   Last Blood Pressure: 128 / 79  (12/03/2009)   Serum creatinine: 1.35  (08/20/2009)   BMP action: Ordered   Serum potassium 4.0  (08/20/2009)    Hypertension flowsheet reviewed?: Yes   Progress toward BP goal: At goal  Self-Management Support :   Personal Goals (by the next clinic visit) :      Personal blood pressure goal: 140/90  (12/03/2009)   Hypertension self-management support: Written self-care plan  (12/03/2009)   Hypertension self-care plan printed.

## 2010-08-20 NOTE — Miscellaneous (Signed)
  Clinical Lists Changes  Observations: Added new observation of HTN PROGRESS: At goal (04/17/2010 8:36) Added new observation of HTN FSREVIEW: Yes (04/17/2010 8:36) Added new observation of PSADUE: 08/20/2010 (04/17/2010 8:36) Added new observation of DM PROGRESS: N/A (04/17/2010 8:36) Added new observation of DM FSREVIEW: N/A (04/17/2010 8:36) Added new observation of LIPID PROGRS: N/A (04/17/2010 8:36) Added new observation of LIPID FSREVW: N/A (04/17/2010 8:36)      Prevention & Chronic Care Immunizations   Influenza vaccine: Fluvax Non-MCR  (05/20/2009)   Influenza vaccine due: 03/19/2010    Tetanus booster: 07/20/2003: Done.   Tetanus booster due: 07/19/2013    Pneumococcal vaccine: Pneumovax  (10/08/2009)   Pneumococcal vaccine due: None    H. zoster vaccine: Not documented  Colorectal Screening   Hemoccult: Not documented   Hemoccult action/deferral: Not indicated  (08/20/2009)    Colonoscopy: DONE  (09/03/2009)   Colonoscopy action/deferral: GI Referral  (08/20/2009)   Colonoscopy due: 08/2014  Other Screening   PSA: 3.63  (08/20/2009)   PSA action/deferral: Discussed-PSA requested  (08/20/2009)   PSA due due: 08/20/2010   Smoking status: never  (12/03/2009)  Lipids   Total Cholesterol: 136  (06/12/2007)   LDL: 50  (06/12/2007)   LDL Direct: Not documented   HDL: 46  (06/12/2007)   Triglycerides: 201  (06/12/2007)  Hypertension   Last Blood Pressure: 128 / 79  (12/03/2009)   Serum creatinine: 1.35  (08/20/2009)   BMP action: Ordered   Serum potassium 4.0  (08/20/2009)    Hypertension flowsheet reviewed?: Yes   Progress toward BP goal: At goal  Self-Management Support :   Personal Goals (by the next clinic visit) :      Personal blood pressure goal: 140/90  (12/03/2009)   Hypertension self-management support: Written self-care plan  (12/03/2009)

## 2010-08-20 NOTE — Progress Notes (Signed)
Summary: refill  Phone Note Refill Request Call back at Home Phone [phone] Message from:  Patient  Refills Requested: Medication #1:  ADVAIR DISKUS 250-50 MCG/DOSE MISC Inhale 1 puff as directed twice a day  Medication #2:  SPIRIVA HANDIHALER 18 MCG CAPS 1 puff once a day [facility] [address]  Initial call taken by: De Nurse,  May 20, 2009 11:11 AM  Follow-up for Phone Call        last ov 02/28/09. to pcp Follow-up by: Golden Circle RN,  May 20, 2009 11:15 AM  Additional Follow-up for Phone Call Additional follow up Details #1::        Done Additional Follow-up by: Doralee Albino MD,  May 20, 2009 12:25 PM    New/Updated Medications: ADVAIR DISKUS 250-50 MCG/DOSE MISC (FLUTICASONE-SALMETEROL) Inhale 1 puff as directed twice a day Prescriptions: SPIRIVA HANDIHALER 18 MCG CAPS (TIOTROPIUM BROMIDE MONOHYDRATE) 1 puff once a day  #1 x 12   Entered and Authorized by:   Doralee Albino MD   Signed by:   Doralee Albino MD on 05/20/2009   Method used:   Electronically to        [facility] [phone]* (retail)       [address]       [place], [place]  [address]       Ph: [phone]       Fax: [phone]   RxID:   [id number] ADVAIR DISKUS 250-50 MCG/DOSE MISC (FLUTICASONE-SALMETEROL) Inhale 1 puff as directed twice a day  #1 x 12   Entered and Authorized by:   Doralee Albino MD   Signed by:   Doralee Albino MD on 05/20/2009   Method used:   Electronically to        [facility] [phone]* (retail)       [address]       [place], [place]  [address]       Ph: [phone]       Fax: [phone]   RxID:   [id number]

## 2010-08-20 NOTE — Assessment & Plan Note (Signed)
Summary: f/u,df   Vital Signs:  Patient profile:   65 year old male Height:      68 inches Weight:      159 pounds BMI:     24.26 O2 Sat:      94 % on 2 L/min Temp:     98.1 degrees F oral Pulse rate:   106 / minute BP sitting:   153 / 89  (right arm) Cuff size:   regular  Vitals Entered By: Tessie Fass CMA (April 17, 2010 2:23 PM)  O2 Flow:  2 L/min  Serial Vital Signs/Assessments:  Time      Position  BP       Pulse  Resp  Temp     By                     138/85                         Doralee Albino MD  CC: F/U   Primary Care Provider:  Doralee Albino MD  CC:  F/U.  History of Present Illness: Exposed to trich.  Wants to be checked for other STDs.  Does have some burning with urination.  No discharge.  Current Medications (verified): 1)  Advair Diskus 250-50 Mcg/dose Misc (Fluticasone-Salmeterol) .... Inhale 1 Puff As Directed Twice A Day 2)  Ventolin Hfa 108 (90 Base) Mcg/act Aers (Albuterol Sulfate) .... 2 Puffs Q4h As Needed Rescue Shortness of Breath. 3)  Bayer Aspirin 325 Mg Tabs (Aspirin) .... Take 1 Tablet By Mouth Once A Day 4)  Hydrochlorothiazide 25 Mg Tabs (Hydrochlorothiazide) .... Take 1 Tablet By Mouth Once A Day 5)  Spiriva Handihaler 18 Mcg Caps (Tiotropium Bromide Monohydrate) .Marland Kitchen.. 1 Puff Once A Day 6)  Lisinopril 10 Mg  Tabs (Lisinopril) .... Take 1 Tab By Mouth Daily 7)  Omeprazole 40 Mg Cpdr (Omeprazole) .... One By Mouth Daily 8)  Tamsulosin Hcl 0.4 Mg Caps (Tamsulosin Hcl) .... One By Mouth Daily (For Prostate) 9)  Metoclopramide Hcl 10 Mg Tabs (Metoclopramide Hcl) .... One By Mouth Qac and Qhs 10)  Oxygen .... 2 Liters Per Minute Per Nasal Cannula 11)  Viagra 50 Mg Tabs (Sildenafil Citrate) .... One By Mouth 1/2 To 1 Hour Prior To Activity 12)  Metronidazole 500 Mg Tabs (Metronidazole) .... Take All Four Tabs All At Once For Self and Partner.  Allergies (verified): 1)  Doxycycline  Physical Exam  General:   Well-developed,well-nourished,in no acute distress; alert,appropriate and cooperative throughout examination Genitalia:  Testes bilaterally descended without nodularity, tenderness or masses. No scrotal masses or lesions. No penis lesions or urethral discharge.   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 Inhale 1 puff as directed twice a day 2)  Ventolin Hfa 108 (90 Base) Mcg/act Aers (Albuterol sulfate) .... 2 puffs q4h as needed rescue shortness of breath. 3)  Bayer Aspirin 325 Mg Tabs (Aspirin) .... Take 1 tablet by mouth once a day 4)  Hydrochlorothiazide 25 Mg Tabs (Hydrochlorothiazide) .... Take 1 tablet by mouth once a day 5)  Spiriva Handihaler 18 Mcg Caps (Tiotropium bromide monohydrate) .Marland Kitchen.. 1 puff once a day 6)  Lisinopril 10 Mg Tabs (Lisinopril) .... Take 1 tab by mouth daily 7)  Omeprazole 40  Mg Cpdr (Omeprazole) .... One by mouth daily 8)  Tamsulosin Hcl 0.4 Mg Caps (Tamsulosin hcl) .... One by mouth daily (for prostate) 9)  Metoclopramide Hcl 10 Mg Tabs (Metoclopramide hcl) .... One by mouth qac and qhs 10)  Oxygen  .... 2 liters per minute per nasal cannula 11)  Viagra 50 Mg Tabs (Sildenafil citrate) .... One by mouth 1/2 to 1 hour prior to activity 12)  Metronidazole 500 Mg Tabs (Metronidazole) .... Take all four tabs all at once for self and partner.  Other Orders: Influenza Vaccine MCR ([address]) Prescriptions: SPIRIVA HANDIHALER 18 MCG CAPS (TIOTROPIUM BROMIDE MONOHYDRATE) 1 puff once a day  #1 x 12   Entered and Authorized by:   Doralee Albino MD   Signed by:   Doralee Albino MD on 04/17/2010   Method used:   Electronically to        [facility] [phone]* (retail)       [address]        [place], [place]  [address]       Ph: [phone]       Fax: [phone]   RxID:   [id number] VENTOLIN HFA 108 (90 BASE) MCG/ACT AERS (ALBUTEROL SULFATE) 2 puffs q4h as needed rescue Shortness of breath.  #1 x 12   Entered and Authorized by:   Doralee Albino MD   Signed by:   Doralee Albino MD on 04/17/2010   Method used:   Electronically to        [facility] [phone]* (retail)       [address]       [place], [place]  [address]       Ph: [phone]       Fax: [phone]   RxID:   [id number] ADVAIR DISKUS 250-50 MCG/DOSE MISC (FLUTICASONE-SALMETEROL) Inhale 1 puff as directed twice a day  #1 x 12   Entered and Authorized by:   Doralee Albino MD   Signed by:   Doralee Albino MD on 04/17/2010   Method used:   Electronically to        [facility] [phone]* (retail)       [address]       [place], [place]  [address]       Ph: [phone]       Fax: [phone]   RxID:   [id number] METRONIDAZOLE 500 MG TABS (METRONIDAZOLE) Take all four tabs all at once for self and partner.  #8 x 0   Entered and Authorized by:   Doralee Albino MD   Signed by:   Doralee Albino MD on 04/17/2010   Method used:   Electronically to        [facility] [phone]* (retail)       [address]       [place], [place]  [address]       Ph: [phone]       Fax: [phone]   RxID:   [id number]    Prevention & Chronic Care Immunizations   Influenza vaccine: Fluvax MCR  (04/17/2010)   Influenza vaccine due: 03/19/2010    Tetanus booster: 07/20/2003: Done.   Tetanus booster due: 07/19/2013    Pneumococcal vaccine: Pneumovax  (10/08/2009)   Pneumococcal vaccine due: None    H. zoster vaccine: Not documented  Colorectal Screening   Hemoccult: Not documented   Hemoccult action/deferral: Not indicated  (08/20/2009)    Colonoscopy: DONE  (09/03/2009)   Colonoscopy action/deferral: GI Referral  (08/20/2009)  Colonoscopy due: 08/2014  Other Screening    PSA: 3.63  (08/20/2009)   PSA action/deferral: Discussed-PSA requested  (08/20/2009)   PSA due due: 08/20/2010   Smoking status: never  (12/03/2009)  Lipids   Total Cholesterol: 136  (06/12/2007)   LDL: 50  (06/12/2007)   LDL Direct: Not documented   HDL: 46  (06/12/2007)   Triglycerides: 201  (06/12/2007)  Hypertension   Last Blood Pressure: 153 / 89  (04/17/2010)   Serum creatinine: 1.35  (08/20/2009)   BMP action: Ordered   Serum potassium 4.0  (08/20/2009)  Self-Management Support :   Personal Goals (by the next clinic visit) :      Personal blood pressure goal: 140/90  (12/03/2009)   Hypertension self-management support: Written self-care plan  (12/03/2009)   Nursing Instructions: Give Flu vaccine today    Immunizations Administered:  Influenza Vaccine # 1:    Vaccine Type: Fluvax MCR    Site: right deltoid    Mfr: GlaxoSmithKline    Dose: 0.5 ml    Route: IM    Given by: Tessie Fass CMA    Exp. Date: 01/13/2011    Lot #: [id number]    VIS given: 02/10/10 version given April 17, 2010.  Flu Vaccine Consent Questions:    Do you have a history of severe allergic reactions to this vaccine? no    Any prior history of allergic reactions to egg and/or gelatin? no    Do you have a sensitivity to the preservative Thimersol? no    Do you have a past history of Guillan-Barre Syndrome? no    Do you currently have an acute febrile illness? no    Have you ever had a severe reaction to latex? no    Vaccine information given and explained to patient? yes

## 2010-08-20 NOTE — Progress Notes (Signed)
Summary: triage  Phone Note Call from Patient Call back at Home Phone [phone]   Caller: Patient Summary of Call: would like to be worked in for persistant cough. Initial call taken by: Clydell Hakim,  February 18, 2009 8:40 AM  Follow-up for Phone Call        per last OV notes in June, he is to see ENT for this cough if it does not improve. he is ready to see one. message to pcp to write order Follow-up by: Golden Circle RN,  February 18, 2009 8:54 AM  Additional Follow-up for Phone Call Additional follow up Details #1::        Order for ENT referral entered and referral letter written. Additional Follow-up by: Doralee Albino MD,  February 18, 2009 10:23 AM  New Problems: COUGH [phone])   Additional Follow-up for Phone Call Additional follow up Details #2::    called [facility] [phone] to make an appt. they want to know which [employer] he has. if it is Prime, he will need preauthorization. if it is "for Life" they can make the appt. called pt & he will check his card/call the number to find out which he has & then call us back Follow-up by: Golden Circle RN,  February 18, 2009 11:17 AM  New Problems: COUGH [phone])

## 2010-08-20 NOTE — Assessment & Plan Note (Signed)
Summary: worsening dyspnea and cough concerning for COPD exacerbation   Vital Signs:  Patient profile:   64 year old male Weight:      163 pounds O2 Sat:      96 % on 2.5 L/min Temp:     98.1 degrees F oral Pulse rate:   91 / minute BP sitting:   128 / 81  (left arm)  Vitals Entered By: Arlyss Repress CMA, (Nov 27, 2009 8:52 AM)  O2 Flow:  2.5 L/min CC: thinks he has a lung infection Is Patient Diabetic? No Pain Assessment Patient in pain? no        Primary Care Provider:  Doralee Albino MD  CC:  thinks he has a lung infection.  History of Present Illness: 65yo M concerned that he may have a lung infection  Pt states that symptoms began 1 wk ago.  Reports inc oxygen demand (now on 2.5L from 2L) and inc cough and sputum production.  No chest pain.  Unsure of fevers.  Reports taking all of his medications and has quit smoking >5 years.    Habits & Providers  Alcohol-Tobacco-Diet     Tobacco Status: quit     Year Quit: 2006  Current Medications (verified): 1)  Advair Diskus 250-50 Mcg/dose Misc (Fluticasone-Salmeterol) .... Inhale 1 Puff As Directed Twice A Day 2)  Ventolin Hfa 108 (90 Base) Mcg/act Aers (Albuterol Sulfate) .... 2 Puffs Q4h As Needed Rescue Shortness of Breath. 3)  Bayer Aspirin 325 Mg Tabs (Aspirin) .... Take 1 Tablet By Mouth Once A Day 4)  Hydrochlorothiazide 25 Mg Tabs (Hydrochlorothiazide) .... Take 1 Tablet By Mouth Once A Day 5)  Spiriva Handihaler 18 Mcg Caps (Tiotropium Bromide Monohydrate) .Marland Kitchen.. 1 Puff Once A Day 6)  Lisinopril 10 Mg  Tabs (Lisinopril) .... Take 1 Tab By Mouth Daily 7)  Omeprazole 40 Mg Cpdr (Omeprazole) .... One By Mouth Daily 8)  Tamsulosin Hcl 0.4 Mg Caps (Tamsulosin Hcl) .... One By Mouth Daily (For Prostate) 9)  Metoclopramide Hcl 10 Mg Tabs (Metoclopramide Hcl) .... One By Mouth Qac and Qhs 10)  Oxygen .... 2 Liters Per Minute Per Nasal Cannula  Allergies (verified): 1)  Doxycycline  Past History:  Past Medical  History: Reviewed history from 07/25/2007 and no changes required. COPD  Review of Systems      See HPI  Physical Exam  General:  VS Reviewed. Non ill appearing, NAD.  Currently on 2.5L [place].    Mouth:  moist mucus membranes; mildly erythematous posterior pharynx Neck:  supple full ROM Lungs:  dec BS throughout; mild expiratory wheezing worse in the left lung fields; no crackles Heart:  distant heart tones Skin:  nl color and turgor Cervical Nodes:  no LAD   Impression & Recommendations:  Problem # 1:  DYSPNEA (ICD-786.05) Assessment Deteriorated Hx and exam concerning for early COPD exacerbation. Will obtain xray to look for infiltrates. Will decide upon appropriate abx after reviewing the xray results.  Will start on Prednisone x 10 days as well. f/u with Dr. [name] in 1 week to reassess Consider repeating PFTs as we do not have any results in our files.  Orders: Radiology other (Radiology Other) [facility]- Est Level  3 ([address])  Complete Medication List: 1)  Advair Diskus 250-50 Mcg/dose Misc (Fluticasone-salmeterol) .... Inhale 1 puff as directed twice a day 2)  Ventolin Hfa 108 (90 Base) Mcg/act Aers (Albuterol sulfate) .... 2 puffs q4h as needed rescue shortness of breath. 3)  Bayer Aspirin 325  Mg Tabs (Aspirin) .... Take 1 tablet by mouth once a day 4)  Hydrochlorothiazide 25 Mg Tabs (Hydrochlorothiazide) .... Take 1 tablet by mouth once a day 5)  Spiriva Handihaler 18 Mcg Caps (Tiotropium bromide monohydrate) .Marland Kitchen.. 1 puff once a day 6)  Lisinopril 10 Mg Tabs (Lisinopril) .... Take 1 tab by mouth daily 7)  Omeprazole 40 Mg Cpdr (Omeprazole) .... One by mouth daily 8)  Tamsulosin Hcl 0.4 Mg Caps (Tamsulosin hcl) .... One by mouth daily (for prostate) 9)  Metoclopramide Hcl 10 Mg Tabs (Metoclopramide hcl) .... One by mouth qac and qhs 10)  Oxygen  .... 2 liters per minute per nasal cannula  Other Orders: Pulse Oximetry- [facility] (81[address])  Patient Instructions: 1)  Go get the  xray today and I will call you with the results and depending on the xray, I will send in a prescription for both an antibiotic and steroid. 2)  Follow up with Dr. [name] in 1 week to reassess.

## 2010-08-20 NOTE — Progress Notes (Signed)
Summary: Triage  Phone Note Call from Patient Call back at Home Phone [phone]   Caller: friend-[place] Summary of Call: came in last week for cough still not better.  The meds given are not helping. Initial call taken by: Clydell Hakim,  December 23, 2008 10:20 AM  Follow-up for Phone Call        c.o persistant cough. not helped by the meds rx'd. uses [facility] [address]. wants to kno wif there is something else that can be called in for him. message to pcp Follow-up by: Golden Circle RN,  December 23, 2008 11:02 AM  Additional Follow-up for Phone Call Additional follow up Details #1::        Has COPD and will rx as exacerbation.  If still coughing in one week, needs CXR Additional Follow-up by: Doralee Albino MD,  December 23, 2008 3:18 PM    New/Updated Medications: DOXYCYCLINE HYCLATE 100 MG CAPS (DOXYCYCLINE HYCLATE) Take 1 tab twice a day   Prescriptions: DOXYCYCLINE HYCLATE 100 MG CAPS (DOXYCYCLINE HYCLATE) Take 1 tab twice a day  #20 x 0   Entered and Authorized by:   Doralee Albino MD   Signed by:   Doralee Albino MD on 12/23/2008   Method used:   Electronically to        [facility] [phone]* (retail)       [address]       [place], [place]  [address]       Ph: [phone]       Fax: [phone]   RxID:   [id number]

## 2010-08-20 NOTE — Assessment & Plan Note (Signed)
Summary: f/up,tcb   Vital Signs:  Patient profile:   65 year old male Weight:      155.8 pounds Temp:     97.7 degrees F oral Pulse rate:   115 / minute BP sitting:   117 / 81  (right arm) Cuff size:   regular  Vitals Entered By: Loralee Pacas CMA (August 20, 2009 2:49 PM) Comments Chronic cough and had some questions concerning his prostate   History of Present Illness: [facility]. Having trouble with urination.  Difficulty voiding especially after sex Shortness of breath is at his baseline. Wants colonoscopy.  Otherwise up to date  Current Medications (verified): 1)  Advair Diskus 250-50 Mcg/dose Misc (Fluticasone-Salmeterol) .... Inhale 1 Puff As Directed Twice A Day 2)  Ventolin Hfa 108 (90 Base) Mcg/act Aers (Albuterol Sulfate) .... 2 Puffs Q4h As Needed Rescue Shortness of Breath. 3)  Bayer Aspirin 325 Mg Tabs (Aspirin) .... Take 1 Tablet By Mouth Once A Day 4)  Hydrochlorothiazide 25 Mg Tabs (Hydrochlorothiazide) .... Take 1 Tablet By Mouth Once A Day 5)  Spiriva Handihaler 18 Mcg Caps (Tiotropium Bromide Monohydrate) .Marland Kitchen.. 1 Puff Once A Day 6)  Lisinopril 10 Mg  Tabs (Lisinopril) .... Take 1 Tab By Mouth Daily 7)  Omeprazole 40 Mg Cpdr (Omeprazole) .... One By Mouth Daily 8)  Tamsulosin Hcl 0.4 Mg Caps (Tamsulosin Hcl) .... One By Mouth Daily (For Prostate)  Allergies (verified): 1)  Doxycycline  Past History:  Past medical, surgical, family and social histories (including risk factors) reviewed, and no changes noted (except as noted below).  Past Medical History: Reviewed history from 07/25/2007 and no changes required. COPD  Past Surgical History: Reviewed history from 09/15/2006 and no changes required. echocardiogram, nl EF - 10/02/2003, EKG - 06/27/2003  Family History: Reviewed history from 09/15/2006 and no changes required. HBP, colon CA, CVA, DM  Social History: Reviewed history from 09/15/2006 and no changes required. 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  Diffuse mild exp wheeze Prostate:  no nodules, boggy, and 2+ enlarged.     Impression & Recommendations:  Problem # 1:  COPD (ICD-496) Assessment Unchanged  His updated medication list for this problem includes:    Advair Diskus 250-50 Mcg/dose Misc (Fluticasone-salmeterol) ..... Inhale 1 puff as directed twice a day    Ventolin Hfa 108 (90 Base) Mcg/act Aers (Albuterol sulfate) .Marland Kitchen... 2 puffs q4h as needed rescue shortness of breath.    Spiriva Handihaler 18 Mcg Caps (Tiotropium bromide monohydrate) .Marland Kitchen... 1 puff once a day  Orders: [facility]- Est  Level 4 ([address])  Problem # 2:  G E R D (ICD-530.81)  Did not help his chronic cough but does help his indigestion His updated medication list for this problem includes:    Omeprazole 40 Mg Cpdr (Omeprazole) ..... One by mouth daily  Orders: [facility]- Est  Level 4 ([address])  Problem # 3:  BENIGN PROSTATIC HYPERTROPHY, MILD, HX OF (ICD-V13.8) Assessment: New  check PSA and start flomax  Orders: [facility]- Est  Level 4 ([address])  Problem # 4:  HYPERTENSION, BENIGN SYSTEMIC (ICD-401.1)  Good control.  Check BMP His updated medication list for this problem includes:    Hydrochlorothiazide 25 Mg Tabs (Hydrochlorothiazide) .Marland Kitchen... Take 1 tablet by mouth once a day    Lisinopril 10  Mg Tabs (Lisinopril) .Marland Kitchen... Take 1 tab by mouth daily  Orders: T-Basic Metabolic Panel [phone]) [facility]- Est  Level 4 ([address])  Problem # 5:  HEALTH MAINTENANCE EXAM (ICD-V70.0) will refer for screening colonoscopy  Complete Medication List: 1)  Advair Diskus 250-50 Mcg/dose Misc (Fluticasone-salmeterol) .... Inhale 1 puff as directed twice a day 2)  Ventolin Hfa 108 (90 Base) Mcg/act Aers (Albuterol sulfate) .... 2 puffs q4h as needed  rescue shortness of breath. 3)  Bayer Aspirin 325 Mg Tabs (Aspirin) .... Take 1 tablet by mouth once a day 4)  Hydrochlorothiazide 25 Mg Tabs (Hydrochlorothiazide) .... Take 1 tablet by mouth once a day 5)  Spiriva Handihaler 18 Mcg Caps (Tiotropium bromide monohydrate) .Marland Kitchen.. 1 puff once a day 6)  Lisinopril 10 Mg Tabs (Lisinopril) .... Take 1 tab by mouth daily 7)  Omeprazole 40 Mg Cpdr (Omeprazole) .... One by mouth daily 8)  Tamsulosin Hcl 0.4 Mg Caps (Tamsulosin hcl) .... One by mouth daily (for prostate)  Other Orders: T-PSA Total ([id number]) Colonoscopy (Colon) Prescriptions: TAMSULOSIN HCL 0.4 MG CAPS (TAMSULOSIN HCL) one by mouth daily (for prostate)  #30 x 12   Entered and Authorized by:   Doralee Albino MD   Signed by:   Doralee Albino MD on 08/20/2009   Method used:   Electronically to        [facility] [phone]* (retail)       [address]       [place], [place]  [address]       Ph: [phone]       Fax: [phone]   RxID:   [id number] OMEPRAZOLE 40 MG CPDR (OMEPRAZOLE) one by mouth daily  #30 x 12   Entered and Authorized by:   Doralee Albino MD   Signed by:   Doralee Albino MD on 08/20/2009   Method used:   Electronically to        [facility] [phone]* (retail)       [address]       [place], [place]  [address]       Ph: [phone]       Fax: [phone]   RxID:   [id number]    Prevention & Chronic Care Immunizations   Influenza vaccine: Fluvax Non-MCR  (05/20/2009)   Influenza vaccine due: 05/06/2009    Tetanus booster: 07/20/2003: Done.   Tetanus booster due: 07/19/2013    Pneumococcal vaccine: Done.  (06/19/2003)   Pneumococcal vaccine due: None    H. zoster vaccine: Not documented  Colorectal Screening   Hemoccult: Not documented   Hemoccult action/deferral: Not indicated  (08/20/2009)    Colonoscopy: Not documented   Colonoscopy action/deferral: GI Referral  (08/20/2009)  Other Screening   PSA: 3.95   (05/06/2008)   PSA ordered.   PSA action/deferral: Discussed-PSA requested  (08/20/2009)   PSA due due: 05/06/2009   Smoking status: quit > 6 months  (02/28/2009)  Lipids   Total Cholesterol: 136  (06/12/2007)   LDL: 50  (06/12/2007)   LDL Direct: Not documented   HDL: 46  (06/12/2007)   Triglycerides: 201  (06/12/2007)  Hypertension   Last Blood Pressure: 117 / 81  (08/20/2009)   Serum creatinine: 1.23  (05/06/2008)   BMP action: Ordered   Serum potassium 4.4  (05/06/2008)    Hypertension flowsheet reviewed?: Yes   Progress toward BP goal: At goal  Self-Management Support :    Hypertension self-management support: Not documented   Nursing Instructions: Screening colonoscopy ordered

## 2010-08-20 NOTE — Progress Notes (Signed)
Summary: Triage  Phone Note Call from Patient Call back at Home Phone [phone]   Caller: Friend-[place] Summary of Call: the cough medicine that Mr. Kehoe was given has been making him throw up everytime he takes it.  Can something else be called in or does he need to seem him.   Initial call taken by: Clydell Hakim,  December 25, 2008 9:12 AM  Follow-up for Phone Call        spoke with pt. states the antibiotic is making him vomit. wants a different med. message to pcp Follow-up by: Golden Circle RN,  December 25, 2008 9:15 AM  Additional Follow-up for Phone Call Additional follow up Details #1::        New Rx sent and entered doxy intolerance in chart. Additional Follow-up by: Doralee Albino MD,  December 25, 2008 9:22 AM   New Allergies: DOXYCYCLINE New/Updated Medications: AZITHROMYCIN 250 MG  TABS (AZITHROMYCIN) 2 by  mouth today and then 1 daily for 4 days New Allergies: DOXYCYCLINE  Prescriptions: AZITHROMYCIN 250 MG  TABS (AZITHROMYCIN) 2 by  mouth today and then 1 daily for 4 days  #6 x 0   Entered and Authorized by:   Doralee Albino MD   Signed by:   Doralee Albino MD on 12/25/2008   Method used:   Electronically to        [facility]  [address] [address] [id number]* (retail)       [address]       [place], [place]  [address]       Ph: [phone]       Fax: [phone]   RxID:   [id number]  pt notified.Golden Circle RN  December 25, 2008 10:27 AM

## 2010-08-20 NOTE — Assessment & Plan Note (Signed)
Summary: persistant cough x 2 weeks   Vital Signs:  Patient profile:   65 year old male Height:      68 inches Weight:      158 pounds BMI:     24.11 Temp:     97.9 degrees F oral Pulse rate:   64 / minute BP sitting:   128 / 76  (left arm) Cuff size:   regular  Vitals Entered By: Dedra Skeens CMA, (Dec 05, 2008 1:41 PM) CC: persistant cough Is Patient Diabetic? No Pain Assessment Patient in pain? no        History of Present Illness: cough x 2 weeks.  clear phlegm on occasion.  has copd and says always w/ shortness of breath and no worse.  cough feels more like a tickle in back of throat.  resolves w/ sucking on a mint. using otc cough syrup.  has tried claritin which helped at first but then no longer.  feels lke it's pollen irritating his throat.  no fevers.  no new meds.  cut back on advair to daily b/c thought it was irritiating throat.  used albuterol 2-3 times this week.    Habits & Providers     Tobacco Status: quit > 6 months     Tobacco Counseling: to remain off tobacco products  Current Medications (verified): 1)  Advair Diskus 250-50 Mcg/dose Misc (Fluticasone-Salmeterol) .... Inhale 1 Puff As Directed Twice A Day 2)  Ventolin Hfa 108 (90 Base) Mcg/act Aers (Albuterol Sulfate) .... 2 Puffs Q4h As Needed Rescue Shortness of Breath. 3)  Bayer Aspirin 325 Mg Tabs (Aspirin) .... Take 1 Tablet By Mouth Once A Day 4)  Hydrochlorothiazide 25 Mg Tabs (Hydrochlorothiazide) .... Take 1 Tablet By Mouth Once A Day 5)  Spiriva Handihaler 18 Mcg Caps (Tiotropium Bromide Monohydrate) .Marland Kitchen.. 1 Puff Once A Day 6)  Lisinopril 10 Mg  Tabs (Lisinopril) .... Take 1 Tab By Mouth Daily 7)  Fexofenadine Hcl 180 Mg Tabs (Fexofenadine Hcl) .Marland Kitchen.. 1 Tab By Mouth Daily 8)  Benzonatate 100 Mg Caps (Benzonatate) .Marland Kitchen.. 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 EOMI. Perrla.  Mouth:  Oral mucosa and oropharynx without lesions or exudates.  post nasal drip.  mild irritatin of posterior orophayrnx Lungs:  no wheeze, crackles. air movement decreased at lung bases.   no increased work of breathing  Heart:  Normal rate and regular rhythm. S1 and S2 normal without gallop, murmur, click, rub or other extra sounds.   Impression & Recommendations:  Problem # 1:  COUGH (ICD-786.2) Assessment New diff dx includes allergic rhinitis/cough, malignancy, side effect of ACE-I.  Has been on lisinopril since Oct so less likely this is an ACE-I cough although it can occur with prolonged use and it is usuallly described as  a tickle in throat. Howver, patient w/ goal BP and I will have PCP decide if would swithc if cough not improved.  Instead, will try fexofenadine daily to treat allergies.  benzonatate cough supressant.  f/u w/  PCP Orders: [facility]- Est Level  3 (54098)  Complete Medication List: 1)  Advair Diskus 250-50 Mcg/dose Misc (Fluticasone-salmeterol) .... Inhale 1 puff as directed twice a day 2)  Ventolin Hfa 108 (90 Base) Mcg/act Aers (Albuterol sulfate) .... 2 puffs q4h as needed rescue shortness of breath. 3)  Bayer Aspirin 325 Mg Tabs (Aspirin) .... Take 1 tablet by mouth once a day 4)  Hydrochlorothiazide 25 Mg Tabs (Hydrochlorothiazide) .... Take 1 tablet by mouth once a day 5)  Spiriva Handihaler 18 Mcg Caps (Tiotropium bromide monohydrate) .Marland Kitchen.. 1 puff once a day 6)  Lisinopril 10 Mg Tabs (Lisinopril) .... Take 1 tab by mouth daily 7)  Fexofenadine Hcl 180 Mg Tabs (Fexofenadine hcl) .Marland Kitchen.. 1 tab by mouth daily 8)  Benzonatate  100 Mg Caps (Benzonatate) .Marland Kitchen.. 1 tab by mouth two times a day as needed cough  Patient Instructions: 1)  Allergies may be causing your cough 2)  Stop claritin and start fexofenadine once a day 3)  you can take the benzonatate two times a day as needed for cough. 4)  keep your appointment with Dr [name] Prescriptions: BENZONATATE 100 MG CAPS (BENZONATATE) 1 tab by mouth two times a day as needed cough  #20 x 0   Entered and Authorized by:   Johney Maine MD   Signed by:   Johney Maine MD on 12/05/2008   Method used:   Electronically to        [facility] [phone]* (retail)       [address]       [place], [place]  [address]       Ph: [phone]       Fax: [phone]   RxID:   [id number] FEXOFENADINE HCL 180 MG TABS (FEXOFENADINE HCL) 1 tab by mouth daily  #30 x 0   Entered and Authorized by:   Johney Maine MD   Signed by:   Johney Maine MD on 12/05/2008   Method used:   Electronically to        [facility] [phone]* (retail)       [address]       [place], [place]  [address]       Ph: [phone]       Fax: [phone]   RxID:   [id number]

## 2010-08-20 NOTE — Consult Note (Signed)
Summary: [facility]   Imported By: De Nurse 05/07/2009 15:48:49  _____________________________________________________________________  External Attachment:    Type:   Image     Comment:   External Document

## 2010-08-20 NOTE — Assessment & Plan Note (Signed)
Summary: f/u [facility]   Vital Signs:  Patient profile:   65 year old male Height:      68 inches Weight:      164.50 pounds BMI:     25.10 BSA:     1.88 O2 Sat:      97 % on 2 L/min Temp:     97.8 degrees F Pulse rate:   84 / minute BP sitting:   144 / 90  Vitals Entered By: Jone Baseman CMA (May 07, 2010 10:15 AM)  O2 Flow:  2 L/min CC: f/u Is Patient Diabetic? No Pain Assessment Patient in pain? no        Primary Care Provider:  Doralee Albino MD  CC:  f/u.  History of Present Illness: Breathhing worse for 2 weeks.   Still with burning on urination.  Recent rx for trich.  Habits & Providers  Alcohol-Tobacco-Diet     Tobacco Status: never     Tobacco Counseling: to quit use of tobacco products     Year Quit: 2006  Current Medications (verified): 1)  Advair Diskus 250-50 Mcg/dose Misc (Fluticasone-Salmeterol) .... Inhale 1 Puff As Directed Twice A Day 2)  Ventolin Hfa 108 (90 Base) Mcg/act Aers (Albuterol Sulfate) .... 2 Puffs Q4h As Needed Rescue Shortness of Breath. 3)  Bayer Aspirin 325 Mg Tabs (Aspirin) .... Take 1 Tablet By Mouth Once A Day 4)  Hydrochlorothiazide 25 Mg Tabs (Hydrochlorothiazide) .... Take 1 Tablet By Mouth Once A Day 5)  Spiriva Handihaler 18 Mcg Caps (Tiotropium Bromide Monohydrate) .Marland Kitchen.. 1 Puff Once A Day 6)  Lisinopril 10 Mg  Tabs (Lisinopril) .... Take 1 Tab By Mouth Daily 7)  Omeprazole 40 Mg Cpdr (Omeprazole) .... One By Mouth Daily 8)  Tamsulosin Hcl 0.4 Mg Caps (Tamsulosin Hcl) .... One By Mouth Daily (For Prostate) 9)  Metoclopramide Hcl 10 Mg Tabs (Metoclopramide Hcl) .... One By Mouth Qac and Qhs 10)  Oxygen .... 2 Liters Per Minute Per Nasal Cannula 11)  Viagra 50 Mg Tabs (Sildenafil Citrate) .... One By Mouth 1/2 To 1 Hour Prior To Activity 12)  Doxycycline Hyclate 100 Mg Caps (Doxycycline Hyclate) .... Take 1 Tab Twice A Day 13)  Prednisone 20 Mg Tabs (Prednisone) .... Three Tabs Daily For 5 Days.  Allergies  (verified): 1)  Doxycycline  Past History:  Past medical, surgical, family and social histories (including risk factors) reviewed, and no changes noted (except as noted below).  Past Medical History: Reviewed history from 07/25/2007 and no changes required. COPD  Past Surgical History: Reviewed history from 09/15/2006 and no changes required. echocardiogram, nl EF - 10/02/2003, EKG - 06/27/2003  Family History: Reviewed history from 09/15/2006 and no changes required. HBP, colon CA, CVA, DM  Social History: Reviewed history from 09/15/2006 and no changes required. 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  FU 2 weeks, sooner as needed worsening.    Orders: [facility]- Est Level  3 ([address])  Complete Medication List: 1)  Advair Diskus 250-50 Mcg/dose Misc (Fluticasone-salmeterol) .... Inhale 1 puff as directed twice a day 2)  Ventolin Hfa 108 (90 Base) Mcg/act Aers (Albuterol sulfate) .... 2 puffs q4h as needed rescue shortness of breath. 3)  Bayer  Aspirin 325 Mg Tabs (Aspirin) .... Take 1 tablet by mouth once a day 4)  Hydrochlorothiazide 25 Mg Tabs (Hydrochlorothiazide) .... Take 1 tablet by mouth once a day 5)  Spiriva Handihaler 18 Mcg Caps (Tiotropium bromide monohydrate) .Marland Kitchen.. 1 puff once a day 6)  Lisinopril 10 Mg Tabs (Lisinopril) .... Take 1 tab by mouth daily 7)  Omeprazole 40 Mg Cpdr (Omeprazole) .... One by mouth daily 8)  Tamsulosin Hcl 0.4 Mg Caps (Tamsulosin hcl) .... One by mouth daily (for prostate) 9)  Metoclopramide Hcl 10 Mg Tabs (Metoclopramide hcl) .... One by mouth qac and qhs 10)  Oxygen  .... 2 liters per minute per nasal cannula 11)  Viagra 50 Mg Tabs (Sildenafil  citrate) .... One by mouth 1/2 to 1 hour prior to activity 12)  Doxycycline Hyclate 100 Mg Caps (Doxycycline hyclate) .... Take 1 tab twice a day 13)  Prednisone 20 Mg Tabs (Prednisone) .... Three tabs daily for 5 days. Prescriptions: HYDROCHLOROTHIAZIDE 25 MG TABS (HYDROCHLOROTHIAZIDE) Take 1 tablet by mouth once a day  #30 x 12   Entered and Authorized by:   Doralee Albino MD   Signed by:   Doralee Albino MD on 05/07/2010   Method used:   Electronically to        [facility] [phone]* (retail)       [address]       [place], [place]  [address]       Ph: [phone]       Fax: [phone]   RxID:   [id number] LISINOPRIL 10 MG  TABS (LISINOPRIL) Take 1 tab by mouth daily  #30 x 12   Entered and Authorized by:   Doralee Albino MD   Signed by:   Doralee Albino MD on 05/07/2010   Method used:   Electronically to        [facility] [phone]* (retail)       [address]       [place], [place]  [address]       Ph: [phone]       Fax: [phone]   RxID:   [id number] PREDNISONE 20 MG TABS (PREDNISONE) Three tabs daily for 5 days.  #15 x 0   Entered and Authorized by:   Doralee Albino MD   Signed by:   Doralee Albino MD on 05/07/2010   Method used:   Electronically to        [facility] [phone]* (retail)       [address]       [place], [place]  [address]       Ph: [phone]       Fax: [phone]   RxID:   [id number] DOXYCYCLINE HYCLATE 100 MG CAPS (DOXYCYCLINE HYCLATE) Take 1 tab twice a day  #20 x 0   Entered and Authorized by:   Doralee Albino MD   Signed by:   Doralee Albino MD on 05/07/2010   Method used:   Electronically to        [facility] [phone]* (retail)       [address]       [place], [place]  [address]       Ph: [phone]       Fax: [phone]   RxID:   [id number]    Orders Added: 1)  [facility]- Est Level  3 [[address]]     Prevention & Chronic Care Immunizations   Influenza vaccine: Fluvax MCR   (04/17/2010)  Influenza vaccine due: 03/19/2010    Tetanus booster: 07/20/2003: Done.   Tetanus booster due: 07/19/2013    Pneumococcal vaccine: Pneumovax  (10/08/2009)   Pneumococcal vaccine due: None    H. zoster vaccine: Not documented  Colorectal Screening   Hemoccult: Not documented   Hemoccult action/deferral: Not indicated  (08/20/2009)    Colonoscopy: DONE  (09/03/2009)   Colonoscopy action/deferral: GI Referral  (08/20/2009)   Colonoscopy due: 08/2014  Other Screening   PSA: 3.63  (08/20/2009)   PSA action/deferral: Discussed-PSA requested  (08/20/2009)   PSA due due: 08/20/2010   Smoking status: never  (05/07/2010)  Lipids   Total Cholesterol: 136  (06/12/2007)   LDL: 50  (06/12/2007)   LDL Direct: Not documented   HDL: 46  (06/12/2007)   Triglycerides: 201  (06/12/2007)  Hypertension   Last Blood Pressure: 144 / 90  (05/07/2010)   Serum creatinine: 1.35  (08/20/2009)   BMP action: Ordered   Serum potassium 4.0  (08/20/2009)    Hypertension flowsheet reviewed?: Yes   Progress toward BP goal: At goal  Self-Management Support :   Personal Goals (by the next clinic visit) :      Personal blood pressure goal: 140/90  (12/03/2009)   Hypertension self-management support: Written self-care plan  (12/03/2009)

## 2010-08-20 NOTE — Letter (Signed)
Summary: Generic Letter  [facility]  [address]   [place], [place] [address]   Phone: [phone]  Fax: [phone]    07/24/2010  CHIJIOKE LASSER [address] [place], [place]  [address]  To Whom It May Concern :      Due to COPD and documented O2 Saturation of 86 % on 07/24/2010 wihout oxygen  the above named patient , Michael Robertson requires home oxygen.           Sincerely,   Theresia Lo RN

## 2010-08-26 ENCOUNTER — Other Ambulatory Visit: Payer: Self-pay | Admitting: Family Medicine

## 2010-08-26 DIAGNOSIS — K219 Gastro-esophageal reflux disease without esophagitis: Secondary | ICD-10-CM

## 2010-08-26 NOTE — Telephone Encounter (Signed)
Please review and refill

## 2010-08-28 ENCOUNTER — Ambulatory Visit (INDEPENDENT_AMBULATORY_CARE_PROVIDER_SITE_OTHER): Payer: Medicare Other | Admitting: Family Medicine

## 2010-08-28 ENCOUNTER — Encounter: Payer: Self-pay | Admitting: Family Medicine

## 2010-08-28 ENCOUNTER — Ambulatory Visit
Admission: RE | Admit: 2010-08-28 | Discharge: 2010-08-28 | Disposition: A | Discharge status: Home / Self Care | Payer: Medicare Other | Source: Ambulatory Visit | Attending: Family Medicine | Admitting: Family Medicine

## 2010-08-28 DIAGNOSIS — N400 Enlarged prostate without lower urinary tract symptoms: Secondary | ICD-10-CM

## 2010-08-28 DIAGNOSIS — K219 Gastro-esophageal reflux disease without esophagitis: Secondary | ICD-10-CM

## 2010-08-28 DIAGNOSIS — R05 Cough: Secondary | ICD-10-CM

## 2010-08-28 DIAGNOSIS — J441 Chronic obstructive pulmonary disease with (acute) exacerbation: Secondary | ICD-10-CM

## 2010-08-28 DIAGNOSIS — R059 Cough, unspecified: Secondary | ICD-10-CM

## 2010-08-28 MED ORDER — ALBUTEROL SULFATE (2.5 MG/3ML) 0.083% IN NEBU
2.5000 mg | INHALATION_SOLUTION | Freq: Once | RESPIRATORY_TRACT | Status: AC
  Administered 2010-08-28: 2.5 mg via RESPIRATORY_TRACT

## 2010-08-28 MED ORDER — OMEPRAZOLE 40 MG PO CPDR
40.0000 mg | DELAYED_RELEASE_CAPSULE | Freq: Every day | ORAL | Status: DC
Start: 2010-08-28 — End: 2011-02-17

## 2010-08-28 MED ORDER — TAMSULOSIN HCL 0.4 MG PO CAPS: 0.4000 mg | ORAL_CAPSULE | Freq: Every day | ORAL | Status: DC

## 2010-08-28 MED ORDER — IPRATROPIUM BROMIDE 0.02 % IN SOLN
0.5000 mg | Freq: Once | RESPIRATORY_TRACT | Status: AC
  Administered 2010-08-28: 0.5 mg via RESPIRATORY_TRACT

## 2010-08-28 NOTE — Progress Notes (Signed)
  Subjective:    Patient ID: Michael Robertson, male    DOB: April 29, 1946, 65 y.o.   MRN: [id number]  HPI  65 y/o male with COPD on 2L O2 comes in after having fevers up to 102.4, body aches (he did get a flu shot) cough, minimal nasal congestion and sore throat for the last 2 days. He has taken Tylenol for his pain. He has taken Durac (?) For his cough. Hhe has not had to go up on his O2 and he is not using his inhalers more than normal but he is concerned that he has PNA developing because he has had it before.    Review of Systems  Constitutional: Positive for fever.  HENT: Positive for rhinorrhea.   Respiratory: Positive for wheezing. Negative for shortness of breath.   All other systems reviewed and are negative.       Objective:   Physical Exam  Constitutional: He appears well-nourished. No distress.  HENT:  Head: Normocephalic and atraumatic.  Eyes: EOM are normal. Right eye exhibits no discharge. No scleral icterus.  Cardiovascular: Normal rate, regular rhythm and normal heart sounds.  Exam reveals no gallop and no friction rub.   No murmur heard. Pulmonary/Chest: Effort normal. No respiratory distress. He has wheezes in the right upper field, the right middle field, the right lower field, the left upper field, the left middle field and the left lower field. He has no rhonchi. He has no rales.  Skin: Skin is warm and dry. He is not diaphoretic.          Assessment & Plan:

## 2010-08-28 NOTE — Patient Instructions (Signed)
Start using your albuterol inhaler every 6 hours for wheezing over the next 48 hours.  Go get a chest x-ray today and we will call you with results.

## 2010-08-28 NOTE — Assessment & Plan Note (Signed)
This is likely a viral URI that has started 2 days ago, but because of the patient's poor lung condition I will send him for a CXR today. He did have a breathing neb treatment here which cleared up all wheezing from his lungs. Advised the pt to use his albuterol inhaler more and if his CXR shows something concerning I will call in a medicine for him.

## 2010-08-31 ENCOUNTER — Telehealth: Payer: Self-pay | Admitting: Family Medicine

## 2010-08-31 DIAGNOSIS — J209 Acute bronchitis, unspecified: Secondary | ICD-10-CM

## 2010-08-31 DIAGNOSIS — J440 Chronic obstructive pulmonary disease with acute lower respiratory infection: Secondary | ICD-10-CM

## 2010-08-31 MED ORDER — GUAIFENESIN-CODEINE 100-10 MG/5ML PO SYRP: 5.0000 mL | ORAL_SOLUTION | Freq: Two times a day (BID) | ORAL | Status: DC | PRN

## 2010-08-31 MED ORDER — PREDNISONE 20 MG PO TABS: 60.0000 mg | ORAL_TABLET | Freq: Every day | ORAL | Status: DC

## 2010-08-31 MED ORDER — AZITHROMYCIN 500 MG PO TABS: 500.0000 mg | ORAL_TABLET | Freq: Every day | ORAL | Status: AC

## 2010-08-31 NOTE — Telephone Encounter (Signed)
Called.  Not any better.  Did not start antibiotic until today.  Wants Robitussin AC.  Instructed to call back in two days if not improving.

## 2010-08-31 NOTE — Telephone Encounter (Signed)
Patient informed me that he just got off the phone with Dr. Clotilde Dieter who said she was going to call in an antibiotic.

## 2010-08-31 NOTE — Telephone Encounter (Signed)
I looked back in [facility]. It appears that he has his last COPD exacerbation May and was treated successfully with Azithro and Prednisone. Will treat cough with Robitussin AC.  Pt will be treated with the above course. I will let him know that if he continues to cough some blood even after being treated with the antibiotics he needs to come back in to be evaluated.

## 2010-09-09 ENCOUNTER — Emergency Department (HOSPITAL_COMMUNITY): Payer: Medicare Other

## 2010-09-09 ENCOUNTER — Inpatient Hospital Stay (HOSPITAL_COMMUNITY)
Admission: EM | Admit: 2010-09-09 | Discharge: 2010-09-14 | DRG: 199 | Disposition: A | Discharge status: Home / Self Care | Payer: Medicare Other | Attending: Family Medicine | Admitting: Family Medicine

## 2010-09-09 ENCOUNTER — Encounter: Payer: Self-pay | Admitting: Family Medicine

## 2010-09-09 DIAGNOSIS — Z7982 Long term (current) use of aspirin: Secondary | ICD-10-CM

## 2010-09-09 DIAGNOSIS — K219 Gastro-esophageal reflux disease without esophagitis: Secondary | ICD-10-CM | POA: Diagnosis present

## 2010-09-09 DIAGNOSIS — N400 Enlarged prostate without lower urinary tract symptoms: Secondary | ICD-10-CM | POA: Diagnosis present

## 2010-09-09 DIAGNOSIS — D72829 Elevated white blood cell count, unspecified: Secondary | ICD-10-CM | POA: Diagnosis present

## 2010-09-09 DIAGNOSIS — J441 Chronic obstructive pulmonary disease with (acute) exacerbation: Secondary | ICD-10-CM | POA: Diagnosis present

## 2010-09-09 DIAGNOSIS — Z87891 Personal history of nicotine dependence: Secondary | ICD-10-CM

## 2010-09-09 DIAGNOSIS — K5900 Constipation, unspecified: Secondary | ICD-10-CM | POA: Diagnosis present

## 2010-09-09 DIAGNOSIS — J9383 Other pneumothorax: Principal | ICD-10-CM | POA: Diagnosis present

## 2010-09-09 DIAGNOSIS — I10 Essential (primary) hypertension: Secondary | ICD-10-CM | POA: Diagnosis present

## 2010-09-09 DIAGNOSIS — J189 Pneumonia, unspecified organism: Secondary | ICD-10-CM | POA: Diagnosis present

## 2010-09-09 DIAGNOSIS — Z9981 Dependence on supplemental oxygen: Secondary | ICD-10-CM

## 2010-09-09 LAB — BASIC METABOLIC PANEL
BUN: 14 mg/dL (ref 6–23)
CO2: 29 mEq/L (ref 19–32)
Calcium: 8.8 mg/dL (ref 8.4–10.5)
Chloride: 100 mEq/L (ref 96–112)
Creatinine, Ser: 1.1 mg/dL (ref 0.4–1.5)
GFR calc Af Amer: >60 mL/min (ref >60)
GFR calc non Af Amer: >60 mL/min (ref >60)
Glucose, Bld: 139 mg/dL — ABNORMAL HIGH (ref 70–99)
Potassium: 3.2 mEq/L — ABNORMAL LOW (ref 3.5–5.1)
Sodium: 140 mEq/L (ref 135–145)

## 2010-09-09 LAB — URINALYSIS, ROUTINE W REFLEX MICROSCOPIC
Bilirubin Urine: NEGATIVE
Ketones, ur: NEGATIVE mg/dL
Leukocytes, UA: NEGATIVE
Nitrite: NEGATIVE
Protein, ur: NEGATIVE mg/dL
Specific Gravity, Urine: 1.011 (ref 1.005–1.030)
Urine Glucose, Fasting: NEGATIVE mg/dL
Urobilinogen, UA: 0.2 mg/dL (ref 0.0–1.0)
pH: 7 (ref 5.0–8.0)

## 2010-09-09 LAB — TROPONIN I: Troponin I: 0.02 ng/mL (ref 0.00–0.06)

## 2010-09-09 LAB — DIFFERENTIAL
Basophils Absolute: 0 10*3/uL (ref 0.0–0.1)
Basophils Relative: 0 % (ref 0–1)
Eosinophils Absolute: 0.1 10*3/uL (ref 0.0–0.7)
Eosinophils Relative: 0 % (ref 0–5)
Lymphocytes Relative: 5 % — ABNORMAL LOW (ref 12–46)
Lymphs Abs: 1.3 10*3/uL (ref 0.7–4.0)
Monocytes Absolute: 0.7 10*3/uL (ref 0.1–1.0)
Monocytes Relative: 2 % — ABNORMAL LOW (ref 3–12)
Neutro Abs: 27.3 10*3/uL — ABNORMAL HIGH (ref 1.7–7.7)
Neutrophils Relative %: 93 % — ABNORMAL HIGH (ref 43–77)

## 2010-09-09 LAB — CBC
HCT: 40.2 % (ref 39.0–52.0)
Hemoglobin: 13.2 g/dL (ref 13.0–17.0)
MCH: 31 pg (ref 26.0–34.0)
MCHC: 32.8 g/dL (ref 30.0–36.0)
MCV: 94.4 fL (ref 78.0–100.0)
Platelets: 449 10*3/uL — ABNORMAL HIGH (ref 150–400)
RBC: 4.26 MIL/uL (ref 4.22–5.81)
RDW: 13.4 % (ref 11.5–15.5)
WBC: 29.4 10*3/uL — ABNORMAL HIGH (ref 4.0–10.5)

## 2010-09-09 LAB — URINE MICROSCOPIC-ADD ON

## 2010-09-09 NOTE — H&P (Signed)
Hospital Admission Note Date: 09/09/2010  Patient name: Michael Robertson Medical record number: [id number] Date of birth: Jun 06, 1946 Age: 65 y.o. Gender: male PCP: Sanjuana Letters, MD  Medical Service:  Attending physician:  Tivis Ringer, MD  Pager: Resident (R2/R3): Angeline Slim, MD   Pager: [phone] Resident (R1):     [phone]  Chief Complaint: dyspnea  History of Present Illness:  65 y/o M with history of COPD with intermittent home O2 use,  GERD, HTN presented to ED for worsening dypsnea since earlier today and an increased O2 requirement.  He also has had minimal cough, but was worried since he noticed blood in sputum.  Sputum is blood tinged.  He reports that about 2 wks ago he was treated with a course of antibiotics for pneumonia.  About one week ago he was treated for COPD exacerbation with a 5-day course of prednisone, which he finished on Monday (2 days ago).  Pt was in his usual state of health this past weekend and only needed to use oxygen intermittently.  He denies any fever today, but states that he was feeling tired and short of breath.  He endorses infrequent cough that was blood tinged, which is what worried him enough to come to the ED.  In the ED he was found to have COPD exacerbation requiring a couple neb treatments.  On CXR pt was found to have pneumothorax and we were called to admit while awaiting CVTS consultation.     Current Outpatient Prescriptions  Medication Sig Dispense Refill  . albuterol (VENTOLIN HFA) 108 (90 BASE) MCG/ACT inhaler Inhale 2 puffs into the lungs every 4 (four) hours as needed. Rescue shortness of breath       . aspirin 325 MG tablet Take 325 mg by mouth daily.        . Fluticasone-Salmeterol (ADVAIR DISKUS) 250-50 MCG/DOSE AEPB Inhale 1 puff into the lungs 2 (two) times daily.        Marland Kitchen guaiFENesin-codeine (ROBITUSSIN AC) 100-10 MG/5ML syrup Take 5 mLs by mouth 2 (two) times daily as needed for Cough.  240 mL  0  . hydrochlorothiazide 25 MG  tablet Take 25 mg by mouth daily.        Marland Kitchen lisinopril (PRINIVIL,ZESTRIL) 10 MG tablet Take 10 mg by mouth daily.        . metoclopramide (REGLAN) 10 MG tablet One by mouth qac and qhs       . omeprazole (PRILOSEC) 40 MG capsule Take 1 capsule (40 mg total) by mouth daily.  30 capsule  6  . sildenafil (VIAGRA) 50 MG tablet One by mouth 1/2 to 1 hour prior to activity       . Tamsulosin HCl (FLOMAX) 0.4 MG CAPS Take 1 capsule (0.4 mg total) by mouth daily. For prostate  60 capsule  2  . tiotropium (SPIRIVA HANDIHALER) 18 MCG inhalation capsule Place 18 mcg into inhaler and inhale daily.          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  Physical Exam: Vitals: T 97.8,  RR 17, P 111, BP 163/90, Pulse ox 97% 2L  General appearance: alert and cooperative HEENT: Normocephalic,  without obvious abnormality, atraumatic, PERRLA, EOMI, MMM, No neck masses or nodes CHEST: no tenderness to palpation CVS: tachycardia, otherwise regular rhythm, no murmurs/gallops, no bruit RESP: decreased air movement, few scattered exp wheezes, no rhonchi/crackles ABD: +Hypoactive BS, NT/ND EXT: no c/c/e PULSES: +2 b/l NEURO: nonfocal    Lab results: Lab Results  Component Value Date   WBC 29.4* 09/09/2010   HGB 13.2 09/09/2010   HCT 40.2 09/09/2010   MCV 94.4 09/09/2010   PLT 449* 09/09/2010     Chemistry      Component Value Date/Time   NA 140 09/09/2010 2039   K 3.2* 09/09/2010 2039   CL 100 09/09/2010 2039   CO2 29 09/09/2010 2039   BUN 14 09/09/2010 2039   CREATININE 1.10 09/09/2010 2039      Component Value Date/Time   CALCIUM 8.8 09/09/2010 2039   ALKPHOS 47 06/12/2007  2032   AST 25 06/12/2007 2032   ALT 21 06/12/2007 2032   BILITOT 0.7 06/12/2007 2032     Lab Results  Component Value Date   TROPONINI  Value: 0.02        NO INDICATION OF MYOCARDIAL INJURY. 09/09/2010   UA: neg nitrites, neg leukocytes Umicro: rare squamous epit, 3-6 RBC, rare bacteria  Imaging results:  CXR: Left-sided pneumothorax approximately 25%. The largest   component is seen inferiorly and posteriorly although there is a   component superiorly.    Within the left lung base, parenchymal changes may represent result   of collapse although superimposed infection/mass cannot be   excluded.  This can be evaluated follow-up.    Diffuse chronic obstructive pulmonary type changes.    Prominence of the epicardial region.  This appearance is unchanged   and may be related to a fat pad.  CT would prove helpful for   further delineation.    Nodularity medial aspect left upper lung zone.  Attention to this   on follow-up.   Assessment & Plan by Problem:  65 y/o M with worsening dyspnea and found to have 25% pneumothorax   1.  Pneumothorax: CVTS has been consulted by EDP.  Dr Laneta Simmers has reviewed CXR and would like a repeat CXR in 4 hours to evaluate the progression of the pneumothorax.  He will see pt in AM and determine if a chest tube is needed.  2. Dyspnea: DDx include COPD exacerbation, ACS, PE, infection, Pneumothorax.  Pt was treated with a course of antibiotics for cough about 2 weeks ago.  He was also given a 5-day course of Prednisone last week for COPD exacerbation, which he finished on Monday.  He has scattered wheezes initially and required 2-3 neb treatments.  We will continue to treat him for COPD exacerbation with IV solumedrol 125mg  q6, Xopenex and Atrovent nebs q4/q2.  Pt was started on Vanc and Zosyn in ED and we continue this while follow pt.  For ACS pt has one set of negative troponin and an EKG that showed no ST waves abnormality.  We will cycle 2 sets of CE and  repeat EKG in AM.  Though pt has leukocytosis, it may be from recent prednisone use.  Pt does not endorse recent fevers.  We will check a d-dimer to rule out blood clot as the cause of current presentation.   3. HTN: BPs are elevated in the ED.  Pt has not missed any doses of his antihypertensive medications.  We will continue HCTZ 25mg  and Lisnopril 10mg  and add Labetalol 10mg  IV  q6h prn.  4. GERD: Continue home dose omeprazole 40mg  daily and Reglan 10mg  bid.  5.  BPH: Tamsulosin 0.4mg  daily  6.  Constipation:  Colace and Miralax  7.  FEN/GI: NPO except for meds and ice chips.  1/2 NS + 20 mEQ KCl @ 125cc/hr  8.  DVT Prophylaxis:  Heparin 5000 units SQ qQ8  9.  Code Status:  Full Code  10. Dispo: clinical improvement

## 2010-09-10 ENCOUNTER — Inpatient Hospital Stay (HOSPITAL_COMMUNITY): Payer: Medicare Other

## 2010-09-10 DIAGNOSIS — J9312 Secondary spontaneous pneumothorax: Secondary | ICD-10-CM

## 2010-09-10 DIAGNOSIS — J189 Pneumonia, unspecified organism: Secondary | ICD-10-CM

## 2010-09-10 DIAGNOSIS — R911 Solitary pulmonary nodule: Secondary | ICD-10-CM

## 2010-09-10 DIAGNOSIS — J159 Unspecified bacterial pneumonia: Secondary | ICD-10-CM

## 2010-09-10 DIAGNOSIS — J441 Chronic obstructive pulmonary disease with (acute) exacerbation: Secondary | ICD-10-CM

## 2010-09-10 LAB — CBC
HCT: 37.8 % — ABNORMAL LOW (ref 39.0–52.0)
HCT: 35.5 % — ABNORMAL LOW (ref 39.0–52.0)
Hemoglobin: 12.6 g/dL — ABNORMAL LOW (ref 13.0–17.0)
Hemoglobin: 11.8 g/dL — ABNORMAL LOW (ref 13.0–17.0)
MCH: 31 pg (ref 26.0–34.0)
MCH: 30.8 pg (ref 26.0–34.0)
MCHC: 33.3 g/dL (ref 30.0–36.0)
MCHC: 33.2 g/dL (ref 30.0–36.0)
MCV: 93.1 fL (ref 78.0–100.0)
MCV: 92.7 fL (ref 78.0–100.0)
Platelets: 437 10*3/uL — ABNORMAL HIGH (ref 150–400)
Platelets: 444 10*3/uL — ABNORMAL HIGH (ref 150–400)
RBC: 4.06 MIL/uL — ABNORMAL LOW (ref 4.22–5.81)
RBC: 3.83 MIL/uL — ABNORMAL LOW (ref 4.22–5.81)
RDW: 13.3 % (ref 11.5–15.5)
RDW: 13.5 % (ref 11.5–15.5)
WBC: 16.6 10*3/uL — ABNORMAL HIGH (ref 4.0–10.5)
WBC: 16.8 10*3/uL — ABNORMAL HIGH (ref 4.0–10.5)

## 2010-09-10 LAB — MRSA PCR SCREENING: MRSA by PCR: NEGATIVE

## 2010-09-10 LAB — CK TOTAL AND CKMB (NOT AT ARMC)
CK, MB: 2.1 ng/mL (ref 0.3–4.0)
Relative Index: INVALID (ref 0.0–2.5)
Total CK: 56 U/L (ref 7–232)

## 2010-09-10 LAB — BASIC METABOLIC PANEL
BUN: 14 mg/dL (ref 6–23)
CO2: 28 mEq/L (ref 19–32)
Calcium: 8.6 mg/dL (ref 8.4–10.5)
Chloride: 99 mEq/L (ref 96–112)
Creatinine, Ser: 1.24 mg/dL (ref 0.4–1.5)
GFR calc Af Amer: >60 mL/min (ref >60)
GFR calc non Af Amer: 59 mL/min — ABNORMAL LOW (ref >60)
Glucose, Bld: 184 mg/dL — ABNORMAL HIGH (ref 70–99)
Potassium: 3.6 mEq/L (ref 3.5–5.1)
Sodium: 139 mEq/L (ref 135–145)

## 2010-09-10 LAB — EXPECTORATED SPUTUM ASSESSMENT W REFEX TO RESP CULTURE

## 2010-09-10 LAB — CARDIAC PANEL(CRET KIN+CKTOT+MB+TROPI)
CK, MB: 3 ng/mL (ref 0.3–4.0)
CK, MB: 3.9 ng/mL (ref 0.3–4.0)
Relative Index: INVALID (ref 0.0–2.5)
Relative Index: INVALID (ref 0.0–2.5)
Total CK: 72 U/L (ref 7–232)
Total CK: 80 U/L (ref 7–232)
Troponin I: 0.01 ng/mL (ref 0.00–0.06)
Troponin I: <0.01 ng/mL (ref 0.00–0.06)

## 2010-09-10 LAB — STREP PNEUMONIAE URINARY ANTIGEN: Strep Pneumo Urinary Antigen: NEGATIVE

## 2010-09-10 LAB — TROPONIN I: Troponin I: <0.01 ng/mL (ref 0.00–0.06)

## 2010-09-10 LAB — D-DIMER, QUANTITATIVE: D-Dimer, Quant: 0.7 ug/mL-FEU — ABNORMAL HIGH (ref 0.00–0.48)

## 2010-09-10 MED ORDER — IOHEXOL 300 MG/ML  SOLN
100.0000 mL | Freq: Once | INTRAMUSCULAR | Status: AC | PRN
  Administered 2010-09-10: 100 mL via INTRAVENOUS

## 2010-09-11 ENCOUNTER — Inpatient Hospital Stay (HOSPITAL_COMMUNITY): Payer: Medicare Other

## 2010-09-11 DIAGNOSIS — J930 Spontaneous tension pneumothorax: Secondary | ICD-10-CM

## 2010-09-11 LAB — CBC
HCT: 35.5 % — ABNORMAL LOW (ref 39.0–52.0)
Hemoglobin: 11.3 g/dL — ABNORMAL LOW (ref 13.0–17.0)
MCH: 30.1 pg (ref 26.0–34.0)
MCHC: 31.8 g/dL (ref 30.0–36.0)
MCV: 94.4 fL (ref 78.0–100.0)
Platelets: 479 10*3/uL — ABNORMAL HIGH (ref 150–400)
RBC: 3.76 MIL/uL — ABNORMAL LOW (ref 4.22–5.81)
RDW: 13.9 % (ref 11.5–15.5)
WBC: 26.7 10*3/uL — ABNORMAL HIGH (ref 4.0–10.5)

## 2010-09-11 LAB — BASIC METABOLIC PANEL
BUN: 19 mg/dL (ref 6–23)
CO2: 30 mEq/L (ref 19–32)
Calcium: 8.8 mg/dL (ref 8.4–10.5)
Chloride: 102 mEq/L (ref 96–112)
Creatinine, Ser: 1.22 mg/dL (ref 0.4–1.5)
GFR calc Af Amer: >60 mL/min (ref >60)
GFR calc non Af Amer: 60 mL/min — ABNORMAL LOW (ref >60)
Glucose, Bld: 147 mg/dL — ABNORMAL HIGH (ref 70–99)
Potassium: 4.3 mEq/L (ref 3.5–5.1)
Sodium: 141 mEq/L (ref 135–145)

## 2010-09-11 LAB — LEGIONELLA ANTIGEN, URINE: Legionella Antigen, Urine: NEGATIVE

## 2010-09-11 LAB — POCT I-STAT 3, ART BLOOD GAS (G3+)
Acid-Base Excess: 5 mmol/L — ABNORMAL HIGH (ref 0.0–2.0)
Acid-Base Excess: 7 mmol/L — ABNORMAL HIGH (ref 0.0–2.0)
Bicarbonate: 28.7 mEq/L — ABNORMAL HIGH (ref 20.0–24.0)
Bicarbonate: 32.2 mEq/L — ABNORMAL HIGH (ref 20.0–24.0)
O2 Saturation: 97 %
O2 Saturation: 100 %
Patient temperature: 37
TCO2: 30 mmol/L (ref 0–100)
TCO2: 34 mmol/L (ref 0–100)
pCO2 arterial: 39.8 mmHg (ref 35.0–45.0)
pCO2 arterial: 45.6 mmHg — ABNORMAL HIGH (ref 35.0–45.0)
pH, Arterial: 7.465 — ABNORMAL HIGH (ref 7.350–7.450)
pH, Arterial: 7.457 — ABNORMAL HIGH (ref 7.350–7.450)
pO2, Arterial: 85 mmHg (ref 80.0–100.0)
pO2, Arterial: 278 mmHg — ABNORMAL HIGH (ref 80.0–100.0)

## 2010-09-12 ENCOUNTER — Inpatient Hospital Stay (HOSPITAL_COMMUNITY): Payer: Medicare Other

## 2010-09-12 LAB — BASIC METABOLIC PANEL
BUN: 26 mg/dL — ABNORMAL HIGH (ref 6–23)
CO2: 32 mEq/L (ref 19–32)
Calcium: 8.9 mg/dL (ref 8.4–10.5)
Chloride: 101 mEq/L (ref 96–112)
Creatinine, Ser: 1.24 mg/dL (ref 0.4–1.5)
GFR calc Af Amer: >60 mL/min (ref >60)
GFR calc non Af Amer: 59 mL/min — ABNORMAL LOW (ref >60)
Glucose, Bld: 137 mg/dL — ABNORMAL HIGH (ref 70–99)
Potassium: 4.3 mEq/L (ref 3.5–5.1)
Sodium: 139 mEq/L (ref 135–145)

## 2010-09-12 LAB — CBC
HCT: 36 % — ABNORMAL LOW (ref 39.0–52.0)
Hemoglobin: 11.4 g/dL — ABNORMAL LOW (ref 13.0–17.0)
MCH: 29.8 pg (ref 26.0–34.0)
MCHC: 31.7 g/dL (ref 30.0–36.0)
MCV: 94.2 fL (ref 78.0–100.0)
Platelets: 486 10*3/uL — ABNORMAL HIGH (ref 150–400)
RBC: 3.82 MIL/uL — ABNORMAL LOW (ref 4.22–5.81)
RDW: 13.7 % (ref 11.5–15.5)
WBC: 20.3 10*3/uL — ABNORMAL HIGH (ref 4.0–10.5)

## 2010-09-13 ENCOUNTER — Inpatient Hospital Stay (HOSPITAL_COMMUNITY): Payer: Medicare Other

## 2010-09-13 LAB — BASIC METABOLIC PANEL
BUN: 23 mg/dL (ref 6–23)
CO2: 32 mEq/L (ref 19–32)
Calcium: 8.7 mg/dL (ref 8.4–10.5)
Chloride: 98 mEq/L (ref 96–112)
Creatinine, Ser: 1.16 mg/dL (ref 0.4–1.5)
GFR calc Af Amer: >60 mL/min (ref >60)
GFR calc non Af Amer: >60 mL/min (ref >60)
Glucose, Bld: 119 mg/dL — ABNORMAL HIGH (ref 70–99)
Potassium: 4 mEq/L (ref 3.5–5.1)
Sodium: 137 mEq/L (ref 135–145)

## 2010-09-13 LAB — CBC
HCT: 33.5 % — ABNORMAL LOW (ref 39.0–52.0)
Hemoglobin: 10.4 g/dL — ABNORMAL LOW (ref 13.0–17.0)
MCH: 29.4 pg (ref 26.0–34.0)
MCHC: 31 g/dL (ref 30.0–36.0)
MCV: 94.6 fL (ref 78.0–100.0)
Platelets: 468 10*3/uL — ABNORMAL HIGH (ref 150–400)
RBC: 3.54 MIL/uL — ABNORMAL LOW (ref 4.22–5.81)
RDW: 13.4 % (ref 11.5–15.5)
WBC: 14.5 10*3/uL — ABNORMAL HIGH (ref 4.0–10.5)

## 2010-09-14 LAB — CBC
HCT: 39.2 % (ref 39.0–52.0)
Hemoglobin: 12.5 g/dL — ABNORMAL LOW (ref 13.0–17.0)
MCH: 30.6 pg (ref 26.0–34.0)
MCHC: 31.9 g/dL (ref 30.0–36.0)
MCV: 95.8 fL (ref 78.0–100.0)
Platelets: 588 10*3/uL — ABNORMAL HIGH (ref 150–400)
RBC: 4.09 MIL/uL — ABNORMAL LOW (ref 4.22–5.81)
RDW: 13.2 % (ref 11.5–15.5)
WBC: 14.8 10*3/uL — ABNORMAL HIGH (ref 4.0–10.5)

## 2010-09-18 ENCOUNTER — Ambulatory Visit: Payer: Medicare Other | Admitting: Family Medicine

## 2010-09-23 ENCOUNTER — Encounter: Payer: Self-pay | Admitting: Family Medicine

## 2010-09-23 ENCOUNTER — Ambulatory Visit (INDEPENDENT_AMBULATORY_CARE_PROVIDER_SITE_OTHER): Payer: Medicare Other | Admitting: Family Medicine

## 2010-09-23 VITALS — BP 127/59 | HR 73 | Temp 98.0°F | Ht 71.0 in | Wt 157.6 lb

## 2010-09-23 DIAGNOSIS — J449 Chronic obstructive pulmonary disease, unspecified: Secondary | ICD-10-CM

## 2010-09-24 ENCOUNTER — Ambulatory Visit
Admission: RE | Admit: 2010-09-24 | Discharge: 2010-09-24 | Disposition: A | Discharge status: Home / Self Care | Payer: Medicare Other | Source: Ambulatory Visit | Attending: Family Medicine | Admitting: Family Medicine

## 2010-09-24 DIAGNOSIS — J449 Chronic obstructive pulmonary disease, unspecified: Secondary | ICD-10-CM

## 2010-09-24 NOTE — H&P (Signed)
Michael Robertson, Michael Robertson NO.:  [id number]  MEDICAL RECORD NO.:  [id number]           PATIENT TYPE:  I  LOCATION:  2919                         FACILITY:  [facility]  PHYSICIAN:  Nestor Ramp, MD        DATE OF BIRTH:  02/22/1946  DATE OF ADMISSION:  09/09/2010 DATE OF DISCHARGE:                             HISTORY & PHYSICAL   PRIMARY CARE PHYSICIAN:  [name], MD  CHIEF COMPLAINT:  Dyspnea.  HISTORY OF PRESENT ILLNESS:  This is a 65 year old male with a history of COPD with intermittent home O2 use, GERD, and hypertension who presented to the ED for worsening dyspnea since earlier today and then also an increased O2 requirement.  The patient has had minimal cough for the past couple of weeks, but became very worried when he noticed that his cough had some blood-tinged sputum.  He reports that about 2 weeks ago, he was treated with a course of antibiotics for pneumonia.  About a week ago, he was treated for COPD exacerbation with a 5-day course of prednisone, which he finished on Monday (2 days ago).  The patient was in his usual state of health this past weekend and only needed to use oxygen intermittently.  He denies any fever today, but states that he has been feeling tired and short of breath.  In the ED, the patient was found to have COPD exacerbation requiring a couple of neb treatments. He also has a chest x-ray that showed 25% pneumothorax and the Family Medicine Service was called to admit him while awaiting a CVTS consultation.  CURRENT MEDICATIONS: 1. Albuterol HFA inhaled 2 puffs q.4 h. p.r.n. 2. Aspirin 325 mg daily. 3. Advair 250/50 one puff twice daily inhaled. 4. Robitussin AC 100 to 10 mg/mL by mouth 2 times daily as needed for     cough. 5. Hydrochlorothiazide 25 mg p.o. daily. 6. Lisinopril 10 mg p.o. daily. 7. Reglan 10 mg p.o. b.i.d. 8. Omeprazole 40 mg daily. 9. Viagra 50 mg 1/2 tablet 1 hour prior to activity. 10.Flomax 0.4 mg  once daily. 11.Spiriva 18 mcg inhaled daily.  ALLERGIES:  DOXYCYCLINE.  PAST MEDICAL HISTORY: 1. COPD. 2. GERD. 3. Hypertension. 4. BPH. 5. Constipation.  PAST SURGICAL HISTORY:  Echocardiogram from March 2005 that showed normal ejection fraction of 50-55%.  FAMILY HISTORY:  Positive for hypertension, colon cancer, CVA, and diabetes.  SOCIAL HISTORY: 1. The patient quit smoking on May 26, 2003, he smoked 1 to 1-1/2     pack per day for 40 years. 2. Alcohol 1-2 beers socially. 3. Drugs, denies drugs. 4. Occupation, the patient is retired from [employer].  REVIEW OF SYSTEMS:  Pertinent items as noted in the HPI.  PHYSICAL EXAMINATION:  VITALS:  Temperature 97.8, respiratory rate 17, pulse 111, blood pressure 163/90, pulse ox 97% on 2 L. GENERAL:  No apparent distress.  Alert and cooperative throughout exam. HEENT:  Normocephalic, atraumatic.  Pupils are equal, round, and reactive to light and accommodation.  Extraocular motility intact. Moist mucous membranes. NECK:  No neck masses or nodes. CHEST:  No  tenderness to palpation. CARDIOVASCULAR:  Tachycardic, otherwise regular rhythm.  No murmurs, gallops.  No bruit. RESPIRATORY:  Decreased air movement.  Few scattered expiratory wheezes. No rhonchi or crackles. ABDOMEN:  Positive bowel sounds, but is hypoactive, nontender, nondistended. EXTREMITIES:  No clubbing, cyanosis, or edema.  Pulses are +2 bilaterally. NEURO:  Cranial nerves II-XII grossly intact.  LABORATORIES AND STUDIES: 1. CBC that showed white blood cell 29.4, hemoglobin 13.2, hematocrit     40.2, platelet of 449, ANC of 27.3, and MCV of 94.4. 2. BMET that showed sodium 140, potassium 3.2, chloride 100, CO2 of     29, BUN 14, creatinine 1.10, glucose 139, calcium 8.8. 3. Urinalysis that was negative with the exception of small amount of     blood with urinary micro that showed rare squamous epithelials, 3-6     red blood cells, rare bacteria. 4. One  set of cardiac enzyme that has troponin of 0.02. 5. A chest x-ray that showed left-sided pneumothorax approximately     25%.  Within the left lung base parenchymal changes may represent     result of collapse, although superimposed infection/mass cannot be     excluded.  Diffuse chronic obstructive pulmonary type changes.     Nodularity in medial aspect of left upper lung zone.  ASSESSMENT AND PLAN:  This is a 65 year old male with worsening dyspnea who was found to have 25% pneumothorax on the left side. 1. Pneumothorax.  Cardiovascular-Thoracic Surgery has been consulted     by the emergency department physician.  Dr. Laneta Simmers has reviewed the     chest x-ray and would like to have a repeat chest x-ray in 4 hours     to evaluate the progression of the pneumothorax.  He will see the     patient in the a.m. and determine if the chest tube placement is     necessary. 2. Dyspnea.  Differential includes chronic obstructive pulmonary     disease exacerbation, acute coronary syndrome, pulmonary embolism,     infection, pneumothorax.  The patient has been treated with a     course of antibiotics for cough about 2 weeks ago.  He was also     given a 5-day course of prednisone last week for chronic     obstructive pulmonary disease exacerbation, which he finished on     Monday.  He had scattered wheezes initially and required 2-3     nebulizer treatments.  We will continue to treat him for chronic     obstructive pulmonary disease exacerbation with Solu-Medrol 125 mg     q.6 h. for the next 24-72 hours, Xopenex and Atrovent nebulizers     q.4 h./q.2 h. p.r.n..  The patient has been started on vancomycin     and Zosyn in the emergency room, and we will switch antibiotic to      Avelox.       For acute coronary syndrome, the patient has one set of negative      troponin and an EKG that showed no ST-waveabnormality.       We will cycle two sets of cardiac enzymes and repeat EKG in the morning.        Though the patient has leukocytosis, it may be from recemt prednisone use.       The patient does not endorse recent fevers.  There has been questionable      infection seen on the x-ray. We will continue antibiotics and monitor his  white blood cells.     The patient does have complaint of hemoptysis and was tachypneic     and tachycardic leading [place] to be concerned for a pulmonary emboli     as a cause of dyspnea.  We will check a D-dimer. If it is elevated, we     will order CTA.     Given his history of tobacco use and complaint of hemoptysis we are     also concerned with malignancy.  A CTA will assist [place] in uncovering any     mass or lymphadenopathy. 3. Hypertension.  Blood pressures are elevated in the emergency room.     The patient has not missed any doses of his antihypertensive     medication.  We will continue her hydrochlorothiazide 25 mg and     lisinopril 10 mg and add labetalol 10 mg IV q.6 h. p.r.n.  If blood     pressure continues to be elevated, we will consider increasing     lisinopril to 20 mg p.o. daily. 4. Gastroesophageal reflux disease.  We will continue the patient on     home dose of omeprazole 40 mg p.o. daily and Reglan 10 mg b.i.d. 5. Benign prostatic hypertrophy.  We will continue on Flomax 0.4 mg     daily. 6. Constipation.  We will add Colace and MiraLax to his regimen. 7. Fluids, electrolytes, nutrition/gastrointestinal.  The patient will     be n.p.o. except for medications and ice chips.  We will give him     one-half normal saline plus 20 mEq of potassium chloride at 125 mL     per hour.  The patient does have hypokalemia with a potassium of     3.2 and we are repleting him in his IV fluids. 8. Deep vein thrombosis prophylaxis.  Heparin 5000 units SQ q 8 hours.  9. Code status, full code. 10.Disposition, upon clinical improvement.     Angeline Slim, MD   ______________________________ Nestor Ramp, MD    CT/MEDQ  D:  09/10/2010  T:   09/10/2010  Job:  [id number]  cc:   William A. [name], M.D.  Electronically Signed by CAT TA MD on 09/17/2010 10:53:00 AM Electronically Signed by Denny Levy MD on 09/23/2010 01:15:28 PM

## 2010-09-25 NOTE — Patient Instructions (Signed)
Continue all current meds I will call with CXR results

## 2010-09-25 NOTE — Assessment & Plan Note (Signed)
Cont antibiotics.  Repeat CXR to FU pneumo.

## 2010-09-25 NOTE — Progress Notes (Signed)
  Subjective:    Patient ID: Michael Robertson, male    DOB: 11-26-1945, 65 y.o.   MRN: [id number]  HPI Hospitalized with pneumonia, COPD exac, small pneuomothorax. (no chest tube) .  DCed on 6 weeks of antibiotics for infected bleb.  Feeling about the same.  Still not 100%.  He is on his baseline home O2 requirement  Review of Systems     Objective:   Physical Exam Poor air movement.  Minimal wheeze       Assessment & Plan:

## 2010-09-29 ENCOUNTER — Telehealth: Payer: Self-pay | Admitting: Family Medicine

## 2010-09-29 NOTE — Telephone Encounter (Signed)
Called - a bathroom in their home has water damage and mold requiring a major cleanup.  Concerned that the cleanup may worsen his pulmonary status.  Discussed and problem solved.  No specific actions for now.

## 2010-09-29 NOTE — Telephone Encounter (Signed)
Want to talk with you about Mr. Michael Robertson.

## 2010-10-05 NOTE — Discharge Summary (Signed)
Michael Robertson, HINEMAN NO.:  [id number]  MEDICAL RECORD NO.:  [id number]           PATIENT TYPE:  I  LOCATION:  5524                         FACILITY:  [facility]  PHYSICIAN:  Santiago Bumpers. [name], M.D.DATE OF BIRTH:  08-Sep-1945  DATE OF ADMISSION:  09/09/2010 DATE OF DISCHARGE:  09/14/2010                              DISCHARGE SUMMARY   PRIMARY CARE PROVIDER:  Chrissie Noa A. Michael Anna, MD at [facility].  DISCHARGE DIAGNOSES: 1. Pneumothorax - resolving. 2. Chronic obstructive pulmonary disease - end stage, acute     exacerbation. 3. Gastroesophageal reflux disease. 4. Hypertension. 5. Benign prostatic hypertrophy. 6. History of constipation.  DISCHARGE MEDICATIONS: 1. Bisoprolol 5 mg by mouth daily. 2. Docusate 100 mg by mouth twice daily. 3. Ensure 237 mL by mouth 3 times a day with meals. 4. Avelox 400 mg by mouth daily. 5. MiraLax 17 g by mouth daily. 6. Prednisone 40 mg for 5 days followed by 20 mg for 5 days followed     by 10 mg for 6 days, then discontinue. 7. Advair Diskus 250/50 one puff inhaled twice daily. 8. Albuterol inhaler 2 puffs every 4 hours as needed for shortness of     breath. 9. Aspirin 325 mg by mouth daily. 10.Hydrochlorothiazide 25 mg by mouth daily. 11.Lisinopril 10 mg by mouth daily. 12.Omeprazole 40 mg by mouth daily. 13.Spiriva 18 mcg inhaled daily. 14.Tamsulosin 0.4 mg by mouth daily. 15.Viagra 50 mg by mouth daily as needed.  CONSULTS:  Pulmonology.  LABS:  Labs on the date of admission notable for white count of 29.4, hemoglobin 13.2.  Potassium 3.2, creatinine 1.1.  D-dimer was elevated at 0.7.  On the date of discharge, the patient's labs were notable for creatinine of 1.16, white count of 14.8, hemoglobin of 12.5.  IMAGING: 1. Chest x-ray performed on September 09, 2010, demonstrating left-     sided pneumothorax, approximately 25%. 2. Chest x-ray performed on September 10, 2010, demonstrating stable  pneumothorax, emphysema, mild bibasilar atelectasis. 3. CT angiography of the chest performed on September 10, 2010,     demonstrating no evidence of acute embolus, severe bilateral     bullous emphysema, small left hydropneumothorax, it appeared     stable, and a left lower lobe consolidation consistent with     pneumonia. 4. Chest x-ray performed on September 13, 2010, demonstrating air-fluid     level at the left base along with left base airspace consolidation     that was consistent with a previous study. 5. Decubitus x-ray of the chest performed on September 13, 2010,     demonstrating fluid in the cystic lesion at the left base as well     as free layering that remained contained within the cystic lesion.  BRIEF HOSPITAL COURSE:  A 65 year old male with a history of COPD and intermittent O2 use, who presented with increased dyspnea on her baseline in the setting of a recent outpatient treatment for COPD.  1. COPD exacerbation:  The patient was admitted, placed on antibiotics     and steroids, along with nebulizers.  The patient's work of  breathing and tachypnea did gradually improve throughout his     hospital stay. 2. Pneumothorax:  Upon evaluation in the emergency department, the     patient was found to have approximately 25% pneumothorax on the     left side.  Cardiothoracic Surgery was consulted and felt that any     intervention beyond observation would be too risky for the patient,     considering his advanced lung disease.  Accordingly, the patient     was observed with serial chest x-rays that showed gradual     improvement of the pneumothorax.  As the patient remained stable,     they recommended outpatient followup with a chest CT in     approximately 4-8 weeks after discharge to ensure resolution. 3. Pneumonia.  The patient was started on IV antibiotics per the     recommendation of Pulmonology.  As the patient's hospital course     was progressed, he was  transitioned to Avelox by mouth.  Upon     discovery of the aforementioned fluid-filled cystic lesion in the     left lung, it was decided to discharge the patient on an extended     course of Avelox by mouth for a total of 6 weeks.  DISCHARGE INSTRUCTIONS:  The patient was discharged home with no activity restrictions, low-sodium, heart-healthy diet.  ISSUES FOR FOLLOWUP:  The patient will need a chest CT approximately in 4-8 weeks after discharge to ensure resolution of his pneumothorax.  If there are any further concerns, please contact Cardiothoracic Surgery about this patient.  FOLLOWUP APPOINTMENT:  With Dr. Marcelo Baldy [facility] on September 18, 2010, at 10 a.m.    ______________________________ Majel Homer, MD   ______________________________ Santiago Bumpers. Michael Robertson, M.D.    ER/MEDQ  D:  09/21/2010  T:  09/22/2010  Job:  [id number]  Electronically Signed by Manuela Neptune MD on 10/04/2010 07:33:18 PM Electronically Signed by Doralee Albino M.D. on 10/05/2010 02:09:56 PM

## 2010-10-07 ENCOUNTER — Ambulatory Visit (INDEPENDENT_AMBULATORY_CARE_PROVIDER_SITE_OTHER): Payer: Medicare Other | Admitting: Family Medicine

## 2010-10-07 ENCOUNTER — Encounter: Payer: Self-pay | Admitting: Family Medicine

## 2010-10-07 VITALS — BP 133/78 | HR 116 | Temp 98.7°F | Ht 71.0 in | Wt 154.0 lb

## 2010-10-07 DIAGNOSIS — J449 Chronic obstructive pulmonary disease, unspecified: Secondary | ICD-10-CM

## 2010-10-07 MED ORDER — THEOPHYLLINE ER 300 MG PO CP24: 300.0000 mg | ORAL_CAPSULE | Freq: Every day | ORAL | Status: DC

## 2010-10-07 NOTE — Progress Notes (Signed)
  Subjective:    Patient ID: Michael Robertson, male    DOB: [birth year], 65 y.o.   MRN: [id number]  HPI hemoptosis continues.  Not doing so well even on continued antibiotics and steroids.  Seems to have plateaued in recovery and not yet near his previous baseline.    Review of Systems     Objective:   Physical Exam Exp wheeze - more than last visit. Pulse ox OK       Assessment & Plan:

## 2010-10-07 NOTE — Patient Instructions (Signed)
Stop aspirin because it may cause more bleeding New medicine at pharmacy See me in two weeks.

## 2010-10-08 NOTE — Assessment & Plan Note (Addendum)
Severe.  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  Will try adding aminophylline.   Also stop ASA due to hemoptosis. Likely needs repeat CT scan to RO ca.

## 2010-10-16 ENCOUNTER — Emergency Department (HOSPITAL_COMMUNITY): Payer: Medicare Other

## 2010-10-16 ENCOUNTER — Emergency Department (HOSPITAL_COMMUNITY)
Admission: EM | Admit: 2010-10-16 | Discharge: 2010-10-16 | Disposition: A | Discharge status: Home / Self Care | Payer: Medicare Other | Attending: Emergency Medicine | Admitting: Emergency Medicine

## 2010-10-16 DIAGNOSIS — R062 Wheezing: Secondary | ICD-10-CM | POA: Insufficient documentation

## 2010-10-16 DIAGNOSIS — J449 Chronic obstructive pulmonary disease, unspecified: Secondary | ICD-10-CM | POA: Insufficient documentation

## 2010-10-16 DIAGNOSIS — R0601 Orthopnea: Secondary | ICD-10-CM | POA: Insufficient documentation

## 2010-10-16 DIAGNOSIS — I10 Essential (primary) hypertension: Secondary | ICD-10-CM | POA: Insufficient documentation

## 2010-10-16 DIAGNOSIS — K219 Gastro-esophageal reflux disease without esophagitis: Secondary | ICD-10-CM | POA: Insufficient documentation

## 2010-10-16 DIAGNOSIS — R05 Cough: Secondary | ICD-10-CM | POA: Insufficient documentation

## 2010-10-16 DIAGNOSIS — R0602 Shortness of breath: Secondary | ICD-10-CM | POA: Insufficient documentation

## 2010-10-16 DIAGNOSIS — R079 Chest pain, unspecified: Secondary | ICD-10-CM | POA: Insufficient documentation

## 2010-10-16 DIAGNOSIS — R059 Cough, unspecified: Secondary | ICD-10-CM | POA: Insufficient documentation

## 2010-10-16 DIAGNOSIS — R000 Tachycardia, unspecified: Secondary | ICD-10-CM | POA: Insufficient documentation

## 2010-10-16 DIAGNOSIS — J4489 Other specified chronic obstructive pulmonary disease: Secondary | ICD-10-CM | POA: Insufficient documentation

## 2010-10-16 LAB — CBC
HCT: 37.4 % — ABNORMAL LOW (ref 39.0–52.0)
Hemoglobin: 12.1 g/dL — ABNORMAL LOW (ref 13.0–17.0)
MCH: 30.9 pg (ref 26.0–34.0)
MCHC: 32.4 g/dL (ref 30.0–36.0)
MCV: 95.4 fL (ref 78.0–100.0)
Platelets: 316 10*3/uL (ref 150–400)
RBC: 3.92 MIL/uL — ABNORMAL LOW (ref 4.22–5.81)
RDW: 14.2 % (ref 11.5–15.5)
WBC: 16.5 10*3/uL — ABNORMAL HIGH (ref 4.0–10.5)

## 2010-10-16 LAB — URINALYSIS, ROUTINE W REFLEX MICROSCOPIC
Bilirubin Urine: NEGATIVE
Glucose, UA: NEGATIVE mg/dL
Ketones, ur: NEGATIVE mg/dL
Leukocytes, UA: NEGATIVE
Nitrite: NEGATIVE
Protein, ur: NEGATIVE mg/dL
Specific Gravity, Urine: 1.012 (ref 1.005–1.030)
Urobilinogen, UA: 0.2 mg/dL (ref 0.0–1.0)
pH: 6 (ref 5.0–8.0)

## 2010-10-16 LAB — POCT CARDIAC MARKERS
CKMB, poc: 2.1 ng/mL (ref 1.0–8.0)
Myoglobin, poc: 78.7 ng/mL (ref 12–200)
Troponin i, poc: <0.05 ng/mL (ref 0.00–0.09)

## 2010-10-16 LAB — BASIC METABOLIC PANEL
BUN: 19 mg/dL (ref 6–23)
CO2: 25 mEq/L (ref 19–32)
Calcium: 9 mg/dL (ref 8.4–10.5)
Chloride: 102 mEq/L (ref 96–112)
Creatinine, Ser: 1.23 mg/dL (ref 0.4–1.5)
GFR calc Af Amer: >60 mL/min (ref >60)
GFR calc non Af Amer: 59 mL/min — ABNORMAL LOW (ref >60)
Glucose, Bld: 163 mg/dL — ABNORMAL HIGH (ref 70–99)
Potassium: 3 mEq/L — ABNORMAL LOW (ref 3.5–5.1)
Sodium: 139 mEq/L (ref 135–145)

## 2010-10-16 LAB — URINE MICROSCOPIC-ADD ON

## 2010-10-16 LAB — DIFFERENTIAL
Basophils Absolute: 0.1 10*3/uL (ref 0.0–0.1)
Basophils Relative: 1 % (ref 0–1)
Eosinophils Absolute: 0.1 10*3/uL (ref 0.0–0.7)
Eosinophils Relative: 1 % (ref 0–5)
Lymphocytes Relative: 12 % (ref 12–46)
Lymphs Abs: 2 10*3/uL (ref 0.7–4.0)
Monocytes Absolute: 1.1 10*3/uL — ABNORMAL HIGH (ref 0.1–1.0)
Monocytes Relative: 6 % (ref 3–12)
Neutro Abs: 13.3 10*3/uL — ABNORMAL HIGH (ref 1.7–7.7)
Neutrophils Relative %: 81 % — ABNORMAL HIGH (ref 43–77)

## 2010-10-21 ENCOUNTER — Ambulatory Visit (INDEPENDENT_AMBULATORY_CARE_PROVIDER_SITE_OTHER): Payer: Medicare Other | Admitting: Family Medicine

## 2010-10-21 ENCOUNTER — Encounter: Payer: Self-pay | Admitting: Family Medicine

## 2010-10-21 DIAGNOSIS — J449 Chronic obstructive pulmonary disease, unspecified: Secondary | ICD-10-CM

## 2010-10-21 NOTE — Patient Instructions (Signed)
I want to do one thing at a time - so that we know whether that change makes you worse or better. 1. Stop the theophylline now. That medicine can make you jittery and decrease your appetite.  Does stopping this medicine make you feel better? 2.Wait 4 days, then stop the antibiotic - moxifloxocin.  Carefully watch your fever after you stop the antibiotic.   See me in two weeks, if you still feel bad, I will probably put you on prednisone all the time.

## 2010-10-21 NOTE — Progress Notes (Signed)
Pt was running a fever last night of 100.6 took some tylenol and it went down 99 also had some body aches. Stated that he was out yesterday for apprx 3 hours and feels as if he caught a cold on top of what's going on.

## 2010-10-22 LAB — CULTURE, BLOOD (ROUTINE X 2)
Culture  Setup Time: 201203302212
Culture  Setup Time: 201203302213
Culture: NO GROWTH
Culture: NO GROWTH

## 2010-10-22 NOTE — Assessment & Plan Note (Signed)
DC theophylline since poor response.  I believe he is becoming steroid dependant.

## 2010-10-22 NOTE — Progress Notes (Signed)
  Subjective:    Patient ID: Michael Robertson, male    DOB: 01-14-1946, 65 y.o.   MRN: [id number]  HPI Episodes of significant shortness of breath continue.  Seen in ER.  CXR done and no new infiltrate or pneumothorax.  Still of Avelox.  Did not respond well to theophylline.  No improvement in breathing but did decrease appetite.    Review of Systems Denies chest pain or syncope.     Objective:   Physical Exam Wt loss noted. Lungs poor air movement.  End exp wheeze.       Assessment & Plan:

## 2010-10-30 ENCOUNTER — Encounter: Payer: Self-pay | Admitting: Family Medicine

## 2010-10-30 ENCOUNTER — Ambulatory Visit (INDEPENDENT_AMBULATORY_CARE_PROVIDER_SITE_OTHER): Payer: Medicare Other | Admitting: Family Medicine

## 2010-10-30 VITALS — BP 137/79 | HR 81 | Temp 96.6°F | Wt 153.0 lb

## 2010-10-30 DIAGNOSIS — J441 Chronic obstructive pulmonary disease with (acute) exacerbation: Secondary | ICD-10-CM

## 2010-10-30 DIAGNOSIS — J449 Chronic obstructive pulmonary disease, unspecified: Secondary | ICD-10-CM

## 2010-10-30 DIAGNOSIS — J4489 Other specified chronic obstructive pulmonary disease: Secondary | ICD-10-CM

## 2010-10-30 MED ORDER — AZITHROMYCIN 500 MG PO TABS: 500.0000 mg | ORAL_TABLET | Freq: Every day | ORAL | Status: AC

## 2010-10-30 MED ORDER — PREDNISONE 20 MG PO TABS: 20.0000 mg | ORAL_TABLET | Freq: Three times a day (TID) | ORAL | Status: AC

## 2010-10-30 NOTE — Progress Notes (Signed)
  Subjective:    Patient ID: Michael Robertson, male    DOB: 05-16-1946, 65 y.o.   MRN: [id number]  HPI  Severe COPD improved.  Feels better off Theophylline and wt/appetitie improving.  Also off avelox and steroids.    Review of Systems     Objective:   Physical Exam Still prolong exp phase and poor air movement.  No wheeze.       Assessment & Plan:

## 2010-10-30 NOTE — Assessment & Plan Note (Signed)
OK off steroids.  Given Rx for azithro and pred to begin with any flair of COPD.  He is at high risk for death and rehospitalization.

## 2010-12-17 NOTE — Consult Note (Signed)
Michael Robertson, Michael Robertson NO.:  [id number]  MEDICAL RECORD NO.:  [id number]           PATIENT TYPE:  E  LOCATION:  [facility]                         FACILITY:  [facility]  PHYSICIAN:  Pearlean Brownie, M.D.DATE OF BIRTH:  1945/10/20  DATE OF CONSULTATION:  10/16/2010 DATE OF DISCHARGE:  10/16/2010                                CONSULTATION   CHIEF COMPLAINT:  Shortness of breath.  HISTORY OF PRESENT ILLNESS:  The patient is a 65 year old man, who has significant lung disease, who presented with a chief complaint of shortness of breath.  He states that he was at home and had a coughing episode and had a hard time recovering from the coughing episode.  He continued to have shortness of breath and this concerned his fiancee, who called EMS.  In the ambulance, he was given albuterol treatments and also received more albuterol treatments when he arrived to the ER.  His heart rate was also found to be in the 150s after the albuterol treatment.  The patient is on bisoprolol for his elevated heart rate and has been out of this prescription x2 days.  When I evaluated him in the emergency department, he stated his breathing was back to his normal baseline.  The patient denied any fever, no increase in mucus production.  He had nausea, vomiting, or diarrhea.  No cold symptoms. He states albuterol helped his breathing return to normal.  He states he has been taking the new medication, theophylline that Dr. Reginold Agent prescribed for him as directed as well as his other inhalers.  He has been taking those as directed as well.  PAST MEDICAL HISTORY: 1. Pneumothorax, resolving, was admitted on September 09, 2010 for this     problem. 2. COPD, end-stage. 3. Gastroesophageal reflux disease. 4. Hypertension. 5. BPH. 6. History of constipation.  HOME MEDICATIONS: 1. Bisoprolol 5 mg p.o. daily. 2. Docusate 100 mg by mouth b.i.d. 3. Ensure 237 mL by mouth t.i.d. with meals. 4. Avelox 400  mg by mouth daily. 5. MiraLax 17 g by mouth daily. 6. Prednisone 20 mg daily. 7. Advair 1 puff inhaled b.i.d. 8. Albuterol 2 puffs q.4 h as needed for shortness of breath. 9. Aspirin 325 mg p.o. daily. 10.Hydrochlorothiazide 25 mg by mouth daily. 11.Lisinopril 10 mg by mouth daily. 12.Omeprazole 40 mg by mouth daily. 13.Spiriva 18 mcg inhaled daily. 14.Tamsulosin 0.4 mg by mouth daily. 15.Viagra 50 mg by mouth daily as needed. 16.Home O2 at 2 L.  SOCIAL HISTORY:  The patient lives with fiancee.  He states he has not smoked in 6 years.  Denies alcohol or drug use.  No known drug allergies.  FAMILY HISTORY:  Noncontributory.  REVIEW OF SYSTEMS:  As per HPI.  PHYSICAL EXAMINATION:  VITAL SIGNS:  Blood pressure 133/64, heart rate 125, respiratory rate 20, temperature 99.9, pulse ox 92% on 2 liters. GENERAL:  In no acute distress, resting quietly.  No increased work of breathing. HEENT:  Head and face, normocephalic and atraumatic.  Eyes:  Pupils equal, round, and reactive to light and accommodation. CARDIOVASCULAR:  No murmurs, rubs, or gallops.  Regular  rate and rhythm. RESPIRATORY:  Good air movement in upper lobes, diminished in bases with scattered wheezing. ABDOMEN:  Soft, nontender, nondistended. EXTREMITIES:  No edema. NEUROLOGIC:  Alert and oriented x3, moving all four extremities.  LABS AND STUDIES:  EKG showed sinus tachycardia.  No change compared to previous EKG.  Chest x-ray showed severe emphysematous changes with increased interstitial densities of the lung bases.  Finding raised concern for parenchymal lung disease and cannot exclude infection of the lung bases.  BMET:  Sodium 139, potassium 3.0, glucose 163, BUN 19, creatinine 1.23.  Point-of-care cardiac enzymes within normal limits. CBC showed a white blood cell count of 16.5, hemoglobin 12.1.  ASSESSMENT AND PLAN:  The patient is a 65 year old male who presents with chief complaint of shortness of  breath. 1. Shortness of breath.  The patient appeared on exam to be at his     baseline, reviewed clinic note as written by Dr. [name].  He was     evaluated 2 weeks ago by Dr. [name].  Physical exam seems to be     equivalent to his last exam in clinic.  After albuterol rescue     inhalers and nebulizers, it appears he is now back to his baseline.     He has no fever, no increase in mucus production, therefore it is     not likely this is secondary to infection.  His white blood cells     are elevated that he is on steroid therapy at 20 mg daily and has     recently come off with a taper.  The patient is to continue with     his home regimen of Advair, Spiriva, as well as albuterol p.r.n.     and then also he is continue taking as directed theophylline that     was recently added by Dr. [name].  He also is to continue the     prednisone 20 mg as directed by Dr. [name].  He has a followup     appointment with Dr. [name] on Wednesday, October 21, 2010 where his     chronic COPD will be reevaluated. 2. Tachycardia.  The patient has a history of sinus tachycardia, which     he is being treated with bisoprolol 5 mg.  His heart rate was     elevated partly secondary to albuterol treatments, which he had     multiple in the emergency department and in the ambulance, and also     most likely because he has not had bisoprolol x2 days.  The patient     was given bisoprolol x1 in the ED as well as a prescription refill.     During my exam, his heart rate had come down to 120s.  At his last     office visit, his heart rate was 116.  This is not a new problem.     The patient can follow up with Dr. [name] on this.  We will recheck     his heart rate in his followup appointment on Wednesday, October 21, 2010. 3. Hypokalemia, potassium 3.0 was given.  KCl 40 mEq on arrival to the     ER.  Gave the patient another dose 3 hours later prior to discharge     from the ED.  We will have PCP follow up on this  issue. 4. History of pneumothorax, was resolving at the time of discharge     from last  exam.  No pneumothorax on chest x-ray.  It was     recommended at the time of discharge at the end of February for him     to have a repeat CT scan to follow up on this and ensure resolution     in 4-8 weeks, and that was the recommendation written at the end of     February.  We will have PCP follow up on this. 5. Hypertension.  The patient is to continue his home medication     regimen of hydrochlorothiazide, lisinopril, as well as bisoprolol.  FOLLOWUP/DISCHARGE PLAN:  I will discharge the patient from the emergency department today with strict followup with Dr. [name] on October 21, 2010.  The patient is instructed to return to the emergency department if any new or worsening of symptoms, also we provided the patient of our emergency line number that he can call if he has any questions or concerns.     Ellin Mayhew, MD   ______________________________ Pearlean Brownie, M.D.    DC/MEDQ  D:  10/16/2010  T:  10/17/2010  Job:  [id number]  cc:   William A. [name], M.D.  Electronically Signed by Ellin Mayhew  on 11/03/2010 02:28:10 PM Electronically Signed by Pearlean Brownie M.D. on 12/17/2010 09:29:52 AM

## 2010-12-18 ENCOUNTER — Other Ambulatory Visit: Payer: Self-pay | Admitting: Family Medicine

## 2010-12-18 NOTE — Telephone Encounter (Signed)
Refill request

## 2011-02-17 ENCOUNTER — Ambulatory Visit (INDEPENDENT_AMBULATORY_CARE_PROVIDER_SITE_OTHER): Payer: Medicare Other | Admitting: Family Medicine

## 2011-02-17 ENCOUNTER — Encounter: Payer: Self-pay | Admitting: Family Medicine

## 2011-02-17 VITALS — BP 134/82 | HR 120 | Temp 98.3°F | Ht 71.0 in | Wt 158.7 lb

## 2011-02-17 DIAGNOSIS — N400 Enlarged prostate without lower urinary tract symptoms: Secondary | ICD-10-CM

## 2011-02-17 DIAGNOSIS — K219 Gastro-esophageal reflux disease without esophagitis: Secondary | ICD-10-CM

## 2011-02-17 DIAGNOSIS — J449 Chronic obstructive pulmonary disease, unspecified: Secondary | ICD-10-CM

## 2011-02-17 DIAGNOSIS — N529 Male erectile dysfunction, unspecified: Secondary | ICD-10-CM

## 2011-02-17 DIAGNOSIS — J440 Chronic obstructive pulmonary disease with acute lower respiratory infection: Secondary | ICD-10-CM

## 2011-02-17 DIAGNOSIS — J209 Acute bronchitis, unspecified: Secondary | ICD-10-CM

## 2011-02-17 MED ORDER — GUAIFENESIN-CODEINE 100-10 MG/5ML PO SYRP: 5.0000 mL | ORAL_SOLUTION | Freq: Two times a day (BID) | ORAL | Status: DC | PRN

## 2011-02-17 MED ORDER — SILDENAFIL CITRATE 100 MG PO TABS: 100.0000 mg | ORAL_TABLET | ORAL | Status: DC | PRN

## 2011-02-17 MED ORDER — TIOTROPIUM BROMIDE MONOHYDRATE 18 MCG IN CAPS: 18.0000 ug | ORAL_CAPSULE | Freq: Every day | RESPIRATORY_TRACT | Status: DC

## 2011-02-17 MED ORDER — LISINOPRIL 10 MG PO TABS: 10.0000 mg | ORAL_TABLET | Freq: Every day | ORAL | Status: DC

## 2011-02-17 MED ORDER — OMEPRAZOLE 40 MG PO CPDR: 40.0000 mg | DELAYED_RELEASE_CAPSULE | Freq: Every day | ORAL | Status: DC

## 2011-02-17 MED ORDER — TAMSULOSIN HCL 0.4 MG PO CAPS: 0.4000 mg | ORAL_CAPSULE | Freq: Every day | ORAL | Status: DC

## 2011-02-17 MED ORDER — HYDROCHLOROTHIAZIDE 25 MG PO TABS: 25.0000 mg | ORAL_TABLET | Freq: Every day | ORAL | Status: DC

## 2011-02-17 MED ORDER — PNEUMOCOCCAL VAC POLYVALENT 25 MCG/0.5ML IJ INJ: 0.5000 mL | INJECTION | INTRAMUSCULAR | Status: DC

## 2011-02-17 MED ORDER — ZOSTER VACCINE LIVE 19400 UNT/0.65ML ~~LOC~~ SOLR: 0.6500 mL | Freq: Once | SUBCUTANEOUS | Status: DC

## 2011-02-17 MED ORDER — ALBUTEROL SULFATE HFA 108 (90 BASE) MCG/ACT IN AERS: 2.0000 | INHALATION_SPRAY | RESPIRATORY_TRACT | Status: DC | PRN

## 2011-02-17 MED ORDER — BISOPROLOL FUMARATE 5 MG PO TABS: 5.0000 mg | ORAL_TABLET | Freq: Every day | ORAL | Status: DC

## 2011-02-17 MED ORDER — FLUTICASONE-SALMETEROL 250-50 MCG/DOSE IN AEPB: 1.0000 | INHALATION_SPRAY | Freq: Two times a day (BID) | RESPIRATORY_TRACT | Status: DC

## 2011-02-17 NOTE — Progress Notes (Signed)
  Subjective:    Patient ID: Michael Robertson, male    DOB: 1945-11-18, 65 y.o.   MRN: [id number]  HPI Pulse up but has been out of Zebeta for several days.  COPD has been stable  Wants refills on several meds      Review of Systems     Objective:   Physical Exam confirmed heart rate and blood pressure. Neck supple.  Lungs poor tidal volume prolonged expiratory phase scattered wheeze no rales  Heart regular rate and rhythm without murmur gallop  Extremities no edema or cyanosis        Assessment & Plan:

## 2011-02-18 NOTE — Assessment & Plan Note (Signed)
Seems nicely stable at this point on current medications no changes

## 2011-02-18 NOTE — Assessment & Plan Note (Signed)
Poor response to Viagra 50 Will try Viagra 100 this fails he will except long-term impotence.

## 2011-03-02 ENCOUNTER — Encounter: Payer: Self-pay | Admitting: Home Health Services

## 2011-03-02 ENCOUNTER — Ambulatory Visit (INDEPENDENT_AMBULATORY_CARE_PROVIDER_SITE_OTHER): Payer: Medicare Other | Admitting: Home Health Services

## 2011-03-02 VITALS — BP 133/75 | HR 60 | Temp 98.1°F | Ht 71.0 in | Wt 167.0 lb

## 2011-03-02 DIAGNOSIS — Z0000 Encounter for general adult medical examination without abnormal findings: Secondary | ICD-10-CM

## 2011-03-02 NOTE — Progress Notes (Signed)
  Subjective:    Patient ID: Michael Robertson., male    DOB: 1946/05/15, 65 y.o.   MRN: [id number]  HPI I have reviewed this visit, discussed with Arlys John and agree with her documentation.    Review of Systems     Objective:   Physical Exam        Assessment & Plan:

## 2011-03-02 NOTE — Patient Instructions (Signed)
1. Continue to work on eating 3-4 vegetables a day. 2. Work towards maintaining and or losing 5-7 lbs. 3. Contact pharmacy for shingles vaccine.  4. Check out [employer]/ [employer] program for other exercise options.

## 2011-03-02 NOTE — Progress Notes (Signed)
Patient here for annual wellness visit, patient reports: Risk Factors/Conditions needing evaluation or treatment: Pt does not have any risk factors that need evaluation. Home Safety: Pt lives with fiance in 1 story home.  Pt reports having smoke detectors and has adaptive equipment in bathroom.  Other Information: Corrective lens: Pt wears daily corrective lens and visits eye doctor as needed. Dentures: Pt does not have dentures and visits dentist as needed. Memory: Pt reports some memory problems. Patient's Mini Mental Score (recorded in doc. flowsheet): 29  Balance/Gait: Pt does not have any noticeable impairments Balance Abnormal Patient value  Sitting balance    Sit to stand    Attempts to arise    Immediate standing balance    Standing balance    Nudge    Eyes closed- Romberg    Tandem stance    Back lean    Neck Rotation    360 degree turn    Sitting down     Gait Abnormal Patient value  Initiation of gait    Step length-left    Step length-right    Step height-left    Step height-right    Step symmetry    Step continuity    Path deviation    Trunk movement    Walking stance        Annual Wellness Visit Requirements Recorded Today In  Medical, family, social history Past Medical, Family, Social History Section  Current providers Care team  Current medications Medications  Wt, BP, Ht, BMI Vital signs  Hearing assessment (welcome visit) Hearing/vision  Tobacco, alcohol, illicit drug use History  ADL Nurse Assessment  Depression Screening Nurse Assessment  Cognitive impairment Nurse Assessment  Mini Mental Status Document Flowsheet  Fall Risk Nurse Assessment  Home Safety Progress Note  End of Life Planning (welcome visit) Social Documentation  Medicare preventative services Progress Note  Risk factors/conditions needing evaluation/treatment Progress Note  Personalized health advice Patient Instructions, goals, letter  Diet & Exercise Social Documentation    Emergency Contact Social Documentation  Seat Belts Social Documentation  Sun exposure/protection Social Documentation    Medicare Prevention Plan: Recommended pt contact pharmacy for shingles vaccine.   Recommended Medicare Prevention Screenings Men over 10 Test For Frequency Date of Last- BOLD if needed  Colorectal Cancer 1-10 yrs 2/11  Prostate Cancer Never or yearly 2/11  Aortic Aneurysm Once if 65-75 with hx of smoking   Cholesterol 5 yrs 11/08  Diabetes yearly Non diabetic  HIV yearly declined  Influenza Shot yearly 9/11  Pneumonia Shot once 3/11  Zostavax Shot once recommended

## 2011-04-09 ENCOUNTER — Ambulatory Visit (INDEPENDENT_AMBULATORY_CARE_PROVIDER_SITE_OTHER): Payer: Medicare Other | Admitting: Family Medicine

## 2011-04-09 ENCOUNTER — Encounter: Payer: Self-pay | Admitting: Family Medicine

## 2011-04-09 VITALS — BP 132/70 | HR 68 | Temp 97.7°F | Ht 71.0 in | Wt 165.0 lb

## 2011-04-09 DIAGNOSIS — K219 Gastro-esophageal reflux disease without esophagitis: Secondary | ICD-10-CM

## 2011-04-09 DIAGNOSIS — Z23 Encounter for immunization: Secondary | ICD-10-CM

## 2011-04-09 DIAGNOSIS — J449 Chronic obstructive pulmonary disease, unspecified: Secondary | ICD-10-CM

## 2011-04-09 DIAGNOSIS — G4700 Insomnia, unspecified: Secondary | ICD-10-CM

## 2011-04-09 MED ORDER — TRAZODONE HCL 100 MG PO TABS: 100.0000 mg | ORAL_TABLET | Freq: Every day | ORAL | Status: DC

## 2011-04-09 NOTE — Assessment & Plan Note (Signed)
Stable but severe.  I would like to avoid EGD if possible.

## 2011-04-09 NOTE — Assessment & Plan Note (Addendum)
Now with dysphagia.  Feels like he has both reflux and difficulty swallowing.  I am reluctant to order EGD due to severe COPD.  Will investigate with UGI  FU will be based on UGI results.

## 2011-04-09 NOTE — Progress Notes (Signed)
  Subjective:    Patient ID: Michael Robertson., male    DOB: 1946-06-10, 65 y.o.   MRN: [id number]  HPI COPD symptoms at baseline.  Visit today is for dysphagia.  Has both symptoms of reflux and of mid to lower esophageal swallowing dysfunction with food sticking.    Review of Systems     Objective:   Physical Exam Bilateral exp wheeze Cardiac RRR Abd benign       Assessment & Plan:

## 2011-04-13 ENCOUNTER — Telehealth: Payer: Self-pay | Admitting: Family Medicine

## 2011-04-13 NOTE — Telephone Encounter (Signed)
Needs to speak with someone about verifying his liter flow for his O2

## 2011-04-14 NOTE — Telephone Encounter (Signed)
Dr. [name], I spoke with Mr. Camplin and he told me that he is wanting to get a O2 condenser and he forgot to mention this to you at his last visit, and the nurse from this company is calling wanting to know/verify his liter flow. Michael Robertson informed me that he is on 2L of O2 if this is any help to you. Please advise.Loralee Pacas [place]

## 2011-04-14 NOTE — Telephone Encounter (Signed)
Called and lvm for Michael Robertson informing her of what Mr. Axel is using for O2. 2L per minute/24 hours per day.Loralee Pacas [place]

## 2011-04-14 NOTE — Telephone Encounter (Signed)
Yes, 2 liters per minute/ 24 hours per day.

## 2011-05-03 ENCOUNTER — Encounter: Payer: Self-pay | Admitting: Family Medicine

## 2011-05-03 ENCOUNTER — Ambulatory Visit (INDEPENDENT_AMBULATORY_CARE_PROVIDER_SITE_OTHER): Payer: Medicare Other | Admitting: Family Medicine

## 2011-05-03 VITALS — BP 119/77 | HR 82 | Temp 98.0°F | Ht 71.0 in | Wt 166.9 lb

## 2011-05-03 DIAGNOSIS — J4489 Other specified chronic obstructive pulmonary disease: Secondary | ICD-10-CM

## 2011-05-03 DIAGNOSIS — J449 Chronic obstructive pulmonary disease, unspecified: Secondary | ICD-10-CM

## 2011-05-03 NOTE — Patient Instructions (Signed)
Chronic Obstructive Pulmonary Disease (COPD)       Chronic obstructive pulmonary disease (COPD) is a condition in which airflow from the lungs is restricted. The lungs can never return to normal, but there are measures you can take which will improve them and make you feel better.     CAUSES   Smoking.   Breathing in irritants (pollution, cigarette smoke, strong odors, aerosol sprays, paint fumes).   History of lung infections.     TREATMENT   Treatment focuses on making you comfortable (supportive care).     HOME CARE INSTRUCTIONS   If you smoke, stop smoking. The carbon monoxide buildup in the blood robs you of your already short oxygen supply.   Take medicines (antibiotics) that kill germs as directed.     Avoid antihistamines and cough syrups. They dry up your system and slow down the elimination of secretions. This decreases respiratory capacity and may lead to infections.   Drink enough water and fluids to keep your urine clear or pale yellow. This loosens secretions.   Use humidifiers at home and at your bedside if they do not make breathing difficult.   Receive all protective vaccines your caregiver suggests, especially pneumococcal and influenza.   Use home oxygen as suggested.     SEEK MEDICAL CARE IF:   You develop pus-like mucus (sputum).   You have an oral temperature above 100.4.   Breathing is more labored or exercise becomes difficult to do.   You are running out of the medicine you take for your breathing.     SEEK IMMEDIATE MEDICAL CARE IF:   You have a rapid heart rate.   You have agitation, confusion, tremors, or are in a stupor (family members may need to observe this).   It becomes difficult to breathe.   You develop chest pain.   You have an oral temperature above 100.4, not controlled by medicine.     MAKE SURE YOU:    Understand these instructions.    Will watch your condition.   Will get help right away if you are not doing well or get worse.      Document Released: 04/14/2005  Document Re-Released: 09/29/2009  ExitCare Patient Information 2011 [employer].

## 2011-05-03 NOTE — Progress Notes (Signed)
  Subjective:     Michael Robertson. is a 65 y.o. male comes in today because of worsening symptoms of cough and sob over the weekend. The patient is not currently have symptoms / an exacerbation. The patient has COPD for approximately 20 years. Symptoms in previous episodes have included dyspnea, cough, wheezing and increased sputum, and typically last 5 days. Previous episodes have been exacerbated by exposure to cold air and GERD. Current treatment includes albuterol inhaler and oxygen, which generally provides some relief of symptoms.  He uses 2 pillows at night. Patient currently is on oxygen at 2 L/min per nasal cannula.. The patient is having no constitutional symptoms, denying fever, chills, anorexia, or weight loss. The patient has been hospitalized for this condition before. He has a history of 30 pack years. The patient is experiencing exercise intolerance (he does not push himself, but still has frequent sexual activity (uses oxygen during)).  Previous Report Reviewed: historical medical records, lab reports and office notes   The following portions of the patient's history were reviewed and updated as appropriate: allergies, current medications, past family history, past medical history, past social history, past surgical history and problem list.  Review of Systems Pertinent items are noted in HPI.    Objective:    BP 119/77  Pulse 82  Temp(Src) 98 F (36.7 C) (Oral)  Ht 5\' 11"  (1.803 m)  Wt 166 lb 14.4 oz (75.705 kg)  BMI 23.28 kg/m2 General appearance: alert and cooperative Nose: Nares normal. Septum midline. Mucosa normal. No drainage or sinus tenderness. Throat: lips, mucosa, and tongue normal; teeth and gums normal Lungs: scattered expiratory wheezes bilaterally low tidal volumes Heart: regular rate and rhythm, S1, S2 normal, no murmur, click, rub or gallop  Diagnostic Review Pulse oximetry, resting: 97%    Assessment:    resolving Mild Exacerbation likely due to  weather changes. finished antibiotic prophylactic course. has oral prednisone if needed.     Plan:    Discussed diagnosis, its evaluation, treatment and usual course. All questions answered. Agricultural engineer distributed.

## 2011-06-15 ENCOUNTER — Encounter: Payer: Self-pay | Admitting: Home Health Services

## 2011-08-09 ENCOUNTER — Other Ambulatory Visit: Payer: Self-pay | Admitting: Family Medicine

## 2011-08-09 NOTE — Telephone Encounter (Signed)
Refill request

## 2011-08-20 ENCOUNTER — Encounter: Payer: Self-pay | Admitting: Family Medicine

## 2011-08-20 ENCOUNTER — Ambulatory Visit (INDEPENDENT_AMBULATORY_CARE_PROVIDER_SITE_OTHER): Payer: Medicare Other | Admitting: Family Medicine

## 2011-08-20 VITALS — BP 143/76 | HR 77 | Temp 98.1°F | Ht 71.0 in | Wt 164.6 lb

## 2011-08-20 DIAGNOSIS — R7309 Other abnormal glucose: Secondary | ICD-10-CM

## 2011-08-20 DIAGNOSIS — R739 Hyperglycemia, unspecified: Secondary | ICD-10-CM

## 2011-08-20 DIAGNOSIS — J449 Chronic obstructive pulmonary disease, unspecified: Secondary | ICD-10-CM

## 2011-08-20 DIAGNOSIS — I10 Essential (primary) hypertension: Secondary | ICD-10-CM

## 2011-08-20 DIAGNOSIS — Z515 Encounter for palliative care: Secondary | ICD-10-CM | POA: Diagnosis not present

## 2011-08-20 DIAGNOSIS — N183 Chronic kidney disease, stage 3 unspecified: Secondary | ICD-10-CM

## 2011-08-20 LAB — CBC
HCT: 44 % (ref 39.0–52.0)
Hemoglobin: 14.1 g/dL (ref 13.0–17.0)
MCH: 31.1 pg (ref 26.0–34.0)
MCHC: 32 g/dL (ref 30.0–36.0)
MCV: 97.1 fL (ref 78.0–100.0)
Platelets: 306 10*3/uL (ref 150–400)
RBC: 4.53 MIL/uL (ref 4.22–5.81)
RDW: 12.9 % (ref 11.5–15.5)
WBC: 6.7 10*3/uL (ref 4.0–10.5)

## 2011-08-20 MED ORDER — PREDNISONE 20 MG PO TABS: 60.0000 mg | ORAL_TABLET | Freq: Every day | ORAL | Status: DC

## 2011-08-20 MED ORDER — AZITHROMYCIN 500 MG PO TABS: 500.0000 mg | ORAL_TABLET | Freq: Every day | ORAL | Status: DC

## 2011-08-20 NOTE — Progress Notes (Signed)
  Subjective:    Patient ID: Michael Robertson., male    DOB: [birth year], 66 y.o.   MRN: [id number]  HPI  Cannot afford zostavax not intent to try again unless medicare starts covering. Patient has done very well this last year with the refillable prednisone and azithromicin.  He has had to use this combination about 5 times in the last year.  It has worked well and he has not had any hospital or ER visits.  Discussed his goals for end of life care.  Has living will.    Review of Systems  No CP or syncope.  SOB is at his baseline.     Objective:   Physical Exam Lungs prolonged exp phase, scattered exp wheeze.  Barrel chested. Cardiac RRR       Assessment & Plan:

## 2011-08-20 NOTE — Patient Instructions (Signed)
You are doing well Continue to use the antibiotics and prednisone when your COPD acts up.

## 2011-08-21 LAB — BASIC METABOLIC PANEL WITH GFR
BUN: 16 mg/dL (ref 6–23)
CO2: 27 mEq/L (ref 19–32)
Calcium: 9.6 mg/dL (ref 8.4–10.5)
Chloride: 104 mEq/L (ref 96–112)
Creat: 1.41 mg/dL — ABNORMAL HIGH (ref 0.50–1.35)
GFR, Est African American: 60 mL/min
GFR, Est Non African American: 52 mL/min — ABNORMAL LOW
Glucose, Bld: 118 mg/dL — ABNORMAL HIGH (ref 70–99)
Potassium: 4.3 mEq/L (ref 3.5–5.3)
Sodium: 142 mEq/L (ref 135–145)

## 2011-08-23 DIAGNOSIS — N183 Chronic kidney disease, stage 3 unspecified: Secondary | ICD-10-CM | POA: Insufficient documentation

## 2011-08-23 DIAGNOSIS — R739 Hyperglycemia, unspecified: Secondary | ICD-10-CM | POA: Insufficient documentation

## 2011-12-28 ENCOUNTER — Other Ambulatory Visit: Payer: Self-pay | Admitting: Family Medicine

## 2011-12-28 DIAGNOSIS — K219 Gastro-esophageal reflux disease without esophagitis: Secondary | ICD-10-CM

## 2011-12-28 MED ORDER — OMEPRAZOLE 40 MG PO CPDR: 40.0000 mg | DELAYED_RELEASE_CAPSULE | Freq: Every day | ORAL | Status: DC

## 2011-12-28 MED ORDER — BISOPROLOL FUMARATE 5 MG PO TABS: 5.0000 mg | ORAL_TABLET | Freq: Every day | ORAL | Status: DC

## 2011-12-28 MED ORDER — TRAZODONE HCL 100 MG PO TABS: 100.0000 mg | ORAL_TABLET | Freq: Every day | ORAL | Status: DC

## 2011-12-28 MED ORDER — LISINOPRIL 10 MG PO TABS: 10.0000 mg | ORAL_TABLET | Freq: Every day | ORAL | Status: DC

## 2011-12-28 NOTE — Telephone Encounter (Signed)
[facility] to [facility] per fax request.

## 2012-01-01 ENCOUNTER — Other Ambulatory Visit: Payer: Self-pay | Admitting: Family Medicine

## 2012-01-01 MED ORDER — TIOTROPIUM BROMIDE MONOHYDRATE 18 MCG IN CAPS: 18.0000 ug | ORAL_CAPSULE | Freq: Every day | RESPIRATORY_TRACT | Status: DC

## 2012-01-01 MED ORDER — ALBUTEROL SULFATE HFA 108 (90 BASE) MCG/ACT IN AERS: 2.0000 | INHALATION_SPRAY | RESPIRATORY_TRACT | Status: DC | PRN

## 2012-01-05 ENCOUNTER — Other Ambulatory Visit: Payer: Self-pay | Admitting: Family Medicine

## 2012-01-05 DIAGNOSIS — N400 Enlarged prostate without lower urinary tract symptoms: Secondary | ICD-10-CM

## 2012-01-05 MED ORDER — FLUTICASONE-SALMETEROL 250-50 MCG/DOSE IN AEPB: 1.0000 | INHALATION_SPRAY | Freq: Two times a day (BID) | RESPIRATORY_TRACT | Status: DC

## 2012-01-05 MED ORDER — SILDENAFIL CITRATE 100 MG PO TABS: 100.0000 mg | ORAL_TABLET | ORAL | Status: DC | PRN

## 2012-01-05 MED ORDER — HYDROCHLOROTHIAZIDE 25 MG PO TABS: 25.0000 mg | ORAL_TABLET | Freq: Every day | ORAL | Status: DC

## 2012-01-05 MED ORDER — TAMSULOSIN HCL 0.4 MG PO CAPS: 0.4000 mg | ORAL_CAPSULE | Freq: Every day | ORAL | Status: DC

## 2012-02-10 ENCOUNTER — Encounter: Payer: Self-pay | Admitting: Family Medicine

## 2012-02-10 ENCOUNTER — Ambulatory Visit (INDEPENDENT_AMBULATORY_CARE_PROVIDER_SITE_OTHER): Payer: Medicare Other | Admitting: Family Medicine

## 2012-02-10 VITALS — BP 133/76 | HR 76 | Temp 97.1°F | Ht 71.0 in | Wt 153.4 lb

## 2012-02-10 DIAGNOSIS — N183 Chronic kidney disease, stage 3 unspecified: Secondary | ICD-10-CM | POA: Diagnosis not present

## 2012-02-10 DIAGNOSIS — I10 Essential (primary) hypertension: Secondary | ICD-10-CM | POA: Diagnosis not present

## 2012-02-10 DIAGNOSIS — J449 Chronic obstructive pulmonary disease, unspecified: Secondary | ICD-10-CM

## 2012-02-10 LAB — BASIC METABOLIC PANEL
BUN: 18 mg/dL (ref 6–23)
CO2: 29 mEq/L (ref 19–32)
Calcium: 9.2 mg/dL (ref 8.4–10.5)
Chloride: 101 mEq/L (ref 96–112)
Creat: 1.43 mg/dL — ABNORMAL HIGH (ref 0.50–1.35)
Glucose, Bld: 110 mg/dL — ABNORMAL HIGH (ref 70–99)
Potassium: 4.4 mEq/L (ref 3.5–5.3)
Sodium: 138 mEq/L (ref 135–145)

## 2012-02-10 LAB — LIPID PANEL
Cholesterol: 234 mg/dL — ABNORMAL HIGH (ref 0–200)
HDL: 44 mg/dL (ref >39)
LDL Cholesterol: 166 mg/dL — ABNORMAL HIGH (ref 0–99)
Total CHOL/HDL Ratio: 5.3 Ratio
Triglycerides: 120 mg/dL (ref <150)
VLDL: 24 mg/dL (ref 0–40)

## 2012-02-10 NOTE — Progress Notes (Signed)
  Subjective:    Patient ID: Michael Robertson., male    DOB: 04/14/1946, 66 y.o.   MRN: [id number]  HPI Flair of COPD.  He had flair about 3 weeks ago, (cause? Travel to [place]?)  Improved when he took prednisone and antibiotics.  Still with lingering cough and SOB.  No fever.  Cough not productive.  Does not feel sick, just SOB.   Review of Systems     Objective:   Physical Exam Lungs. Good air movement but he does have exp wheeze diffusely - which he normally does not have       Assessment & Plan:

## 2012-02-10 NOTE — Patient Instructions (Signed)
Please do another round of prednisone.  I will call with blood work.

## 2012-02-10 NOTE — Assessment & Plan Note (Signed)
Due for blood work

## 2012-02-10 NOTE — Assessment & Plan Note (Signed)
Not at baseline.  Advised to do another course of prednisone.

## 2012-02-10 NOTE — Assessment & Plan Note (Signed)
Well controled, due for blood work.

## 2012-02-14 ENCOUNTER — Encounter: Payer: Self-pay | Admitting: Family Medicine

## 2012-02-14 DIAGNOSIS — E7800 Pure hypercholesterolemia, unspecified: Secondary | ICD-10-CM | POA: Insufficient documentation

## 2012-02-24 IMAGING — CR DG CHEST 2V
2 series · 2 of 2 positions shown · non-contrast
Comparison: 02/28/2009

CLINICAL DATA: Worsening shortness of breath.  Productive cough.

CHEST - 2 VIEW

[w chest pa]
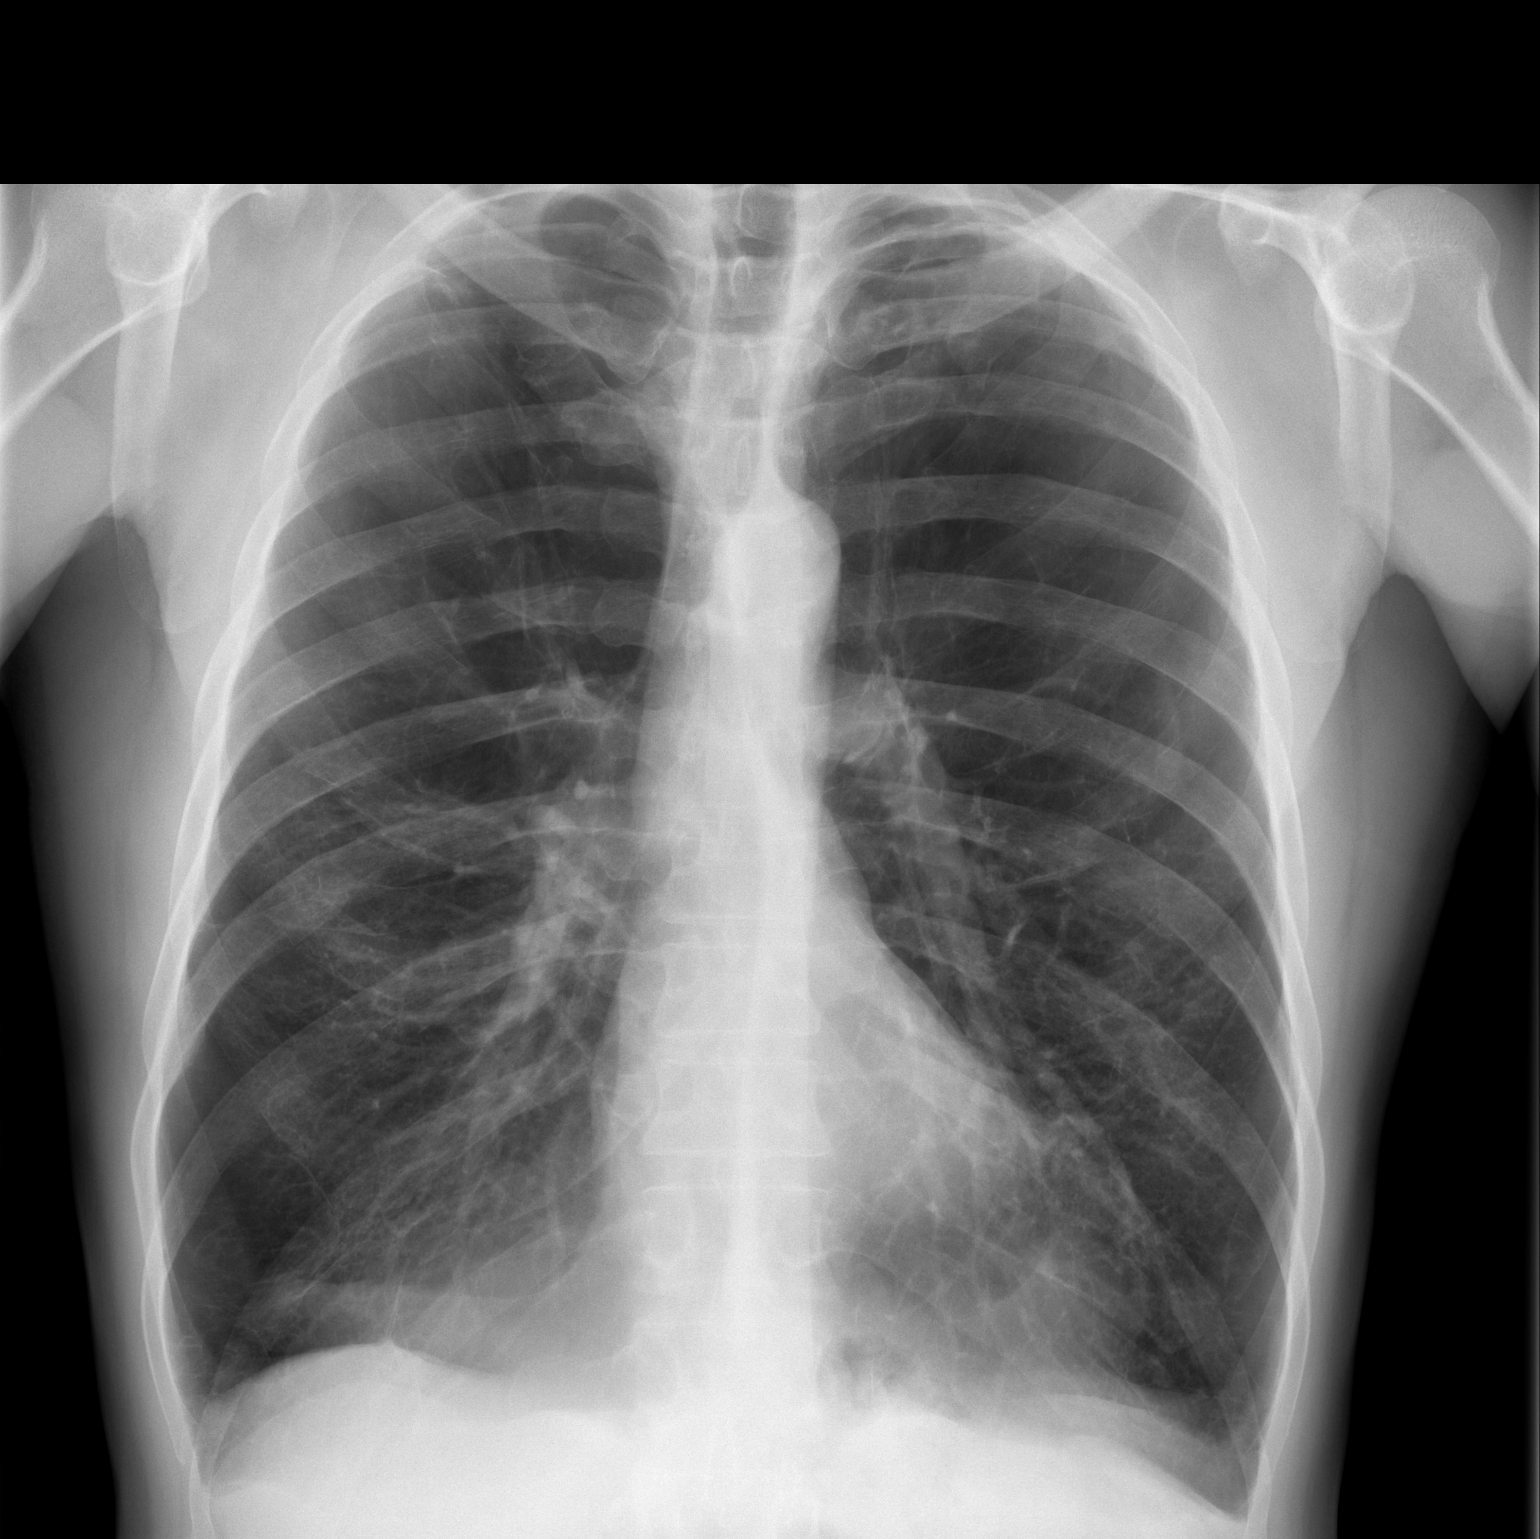

[w chest lat]
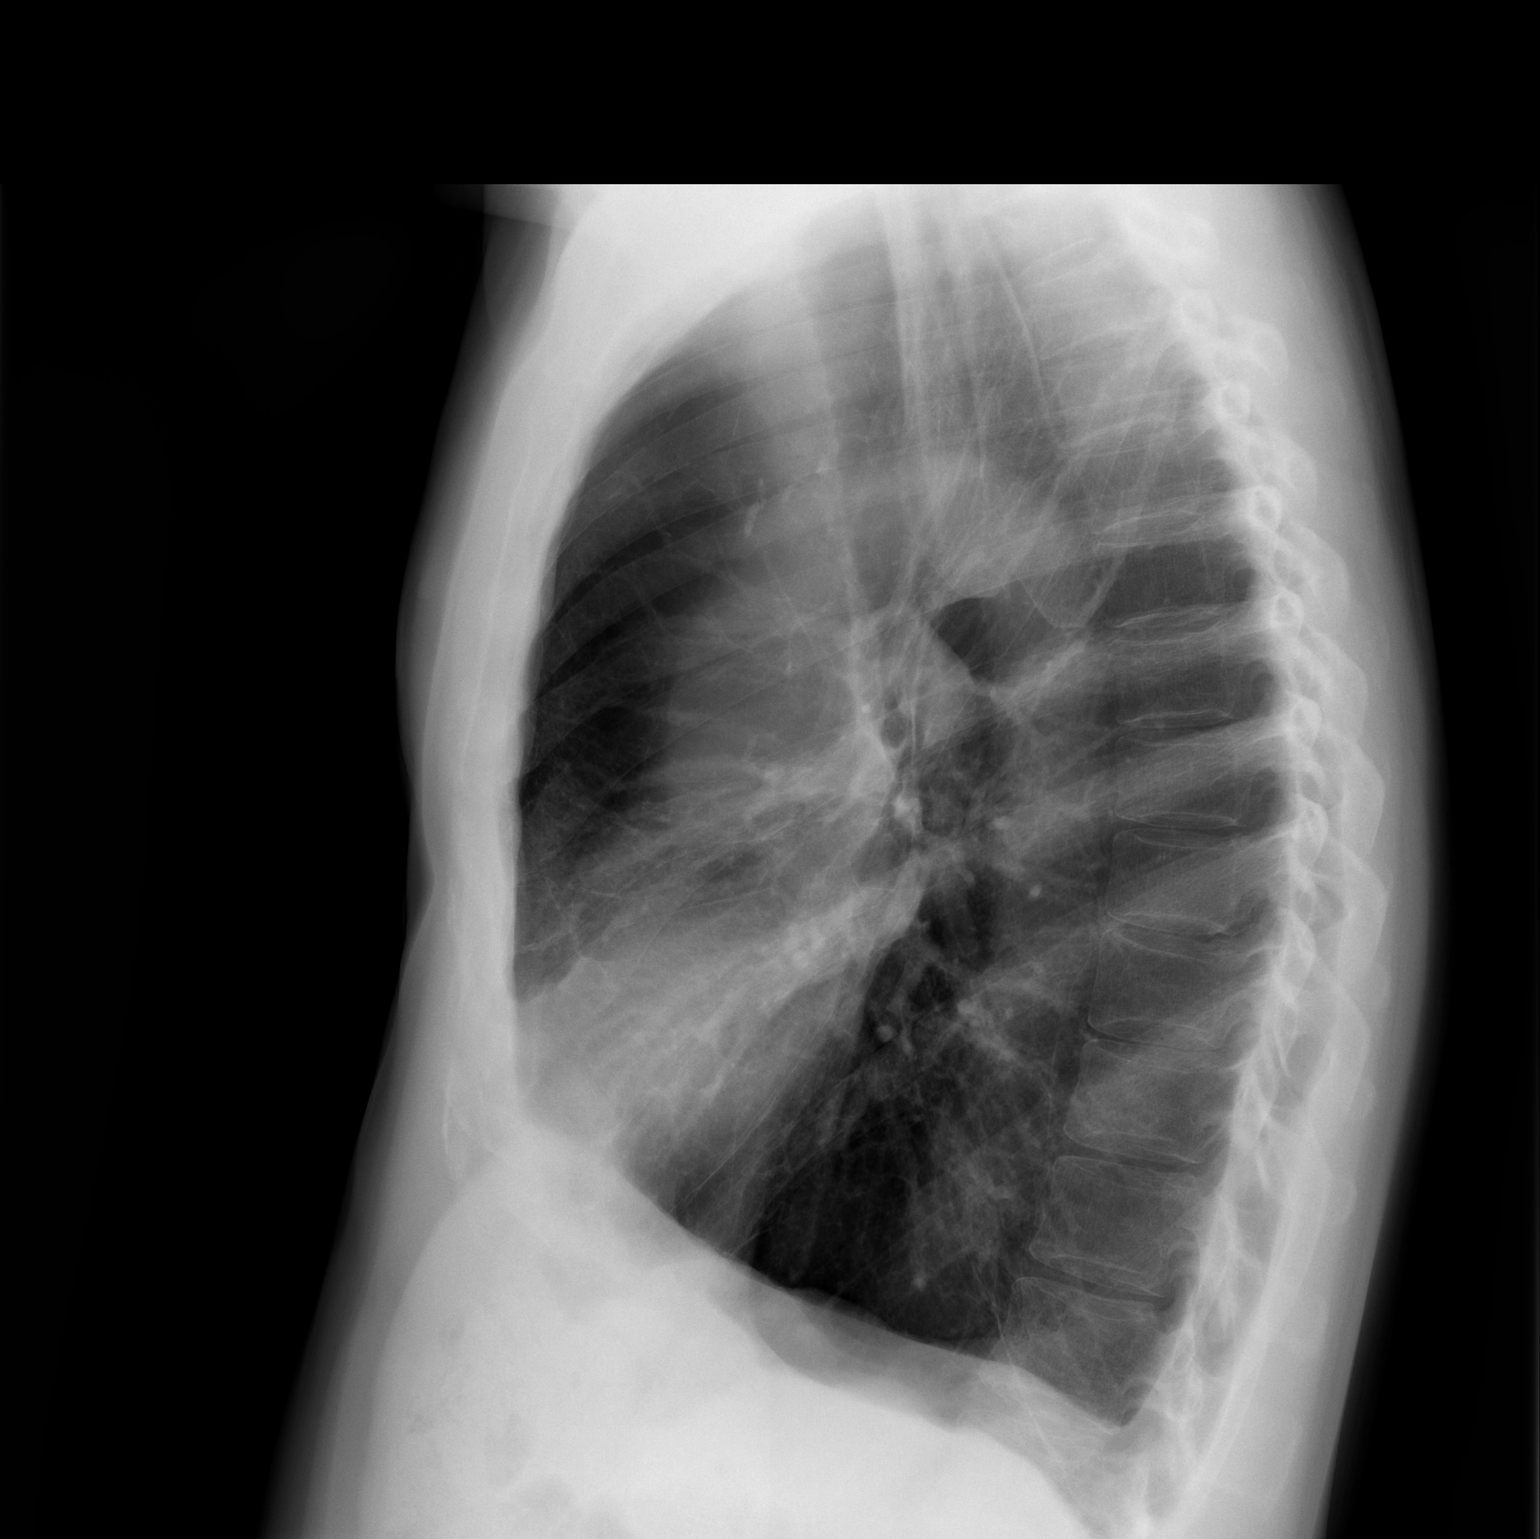

[2 of 2 positions shown; findings below may reference images not displayed]

FINDINGS: Trachea is midline.  Heart size normal.  Lungs are
severely hyperexpanded with obvious changes of emphysema.  Scarring
at the apices bilaterally.  Probable bullous lesion in the left
apex.  No pleural fluid.
IMPRESSION: Emphysema without acute finding.

## 2012-03-13 ENCOUNTER — Encounter: Payer: Self-pay | Admitting: Home Health Services

## 2012-04-04 ENCOUNTER — Encounter: Payer: Self-pay | Admitting: Family Medicine

## 2012-04-04 ENCOUNTER — Other Ambulatory Visit: Payer: Self-pay | Admitting: Family Medicine

## 2012-04-04 ENCOUNTER — Ambulatory Visit (INDEPENDENT_AMBULATORY_CARE_PROVIDER_SITE_OTHER): Payer: Medicare Other | Admitting: Family Medicine

## 2012-04-04 VITALS — BP 135/69 | HR 85 | Temp 99.2°F | Ht 71.0 in | Wt 160.3 lb

## 2012-04-04 DIAGNOSIS — Z23 Encounter for immunization: Secondary | ICD-10-CM

## 2012-04-04 DIAGNOSIS — J069 Acute upper respiratory infection, unspecified: Secondary | ICD-10-CM | POA: Diagnosis not present

## 2012-04-04 MED ORDER — GUAIFENESIN-CODEINE 100-10 MG/5ML PO SYRP: 5.0000 mL | ORAL_SOLUTION | Freq: Three times a day (TID) | ORAL | Status: DC | PRN

## 2012-04-04 NOTE — Progress Notes (Signed)
Subjective:     Patient ID: Michael Lund., male   DOB: 03/09/1946, 66 y.o.   MRN: [id number]  HPI Michael Ishmael. is a 66 y.o. male with COPD presents to clinic with 4 day hx of cold with recent onset of cough.  Cold initially started with sore throat and sinus congestion.  No longer has sore throat but continues to have sinus and nasal congestion with occasional runny nose.  Today he reports some decrease energy.  He has not check his temperature over the past few days, but believes he has had a low grade fever. Last night he developed a cough with pt describes as a dry cough with occasional mucous.  The cough interferred with sleep.  Pt reports that he always has SOB, but since becoming sick, this has not worsened.  He has had no increase oxygen needs recently.  He is currently taking his spriva and advair for his COPD as prescribed.  He has need to increase his albuterol inhaler usage over the past few days (typically uses 1x everyother day; currently using 1-2x/d).  Pt also reports starting a 5 day course of azithromycin which Dr. [name] has prescribed for him to take when he get as cold and starts to cough (taken 2 of 5 days) which has helped a little.  He is eating and drinking well. Sick contacts include his wife who recently had a cold.    Pt last needed prednisone ~2 months ago.  He currently has a standing order for a 5d course of prednisone, but he has not starting taking this. Attending Note Patient seen and examined by me, in conjunction with Billey Gosling ([facility]).  I agree with his assessment and plan as documented, with the following addenda in bold type. Patient with COPD and baseline oxygen requirement of 2L/min, presenting with 4 days of head/nasal congestion and sore throat.  Has not required increase in his oxygen flow rate.  Had started the azithromycin 2 days ago (standing prescription in case of presumed COPD exacerbation). Has increased albuterol HFA use as described above (2  times daily).  Denies feeling short of breath more than his baseline.  Wife recently ill with a cold.  JB  Patient quit smoking cigarettes 6 or 7 years ago. JB  Review of Systems Denies chlils, chest pain, radiating pain in arms, blood in mucous, muscle aches, joint pain, N/V, diarrhea, itchy eyes, eye discharge, ear pain Agree. JB    Objective:   Physical Exam Vitals: BP: 135/69 Pulse: 85 Temp: 99.2 F, RR 20, O2 sat on 1 L O2 at rest = 92% General: no acute distress, able to speak in complete sentences without interruption, comfortably seated in chair with oxygen delivered via nasal canula. HEENT: no conjuctivitis or scleral icterus, no eye discharge, TM clear bilaterally, mucous noted in nares with mild inflammation of turbinates, no pressure with palpation of maxillary and temporal sinuses, moist mucous membranes, no perioral cyanosis, no throat clear w/o erythema/exudate/petechiae, L anterior cervical LAD Lungs: faint expiratory wheezing bilaterally, no crackles/rales, normal work of breathing, no retractions Heart: RRR, no M/R/G Exam; Well appearing, fluid speech, no accessory muscles of respiration.  Using nasal canula oxygen.  HEENT Neck supple, no cervical adenopathy. TMs clear bilat. Injection oropharynx without exudate. No maxillary or frontal sinus tenderness. Injected conjunctivae.  PULM Prolonged expir phase; end-expiratory wheeze/squeak. No rales audible COR Regular S1S2, no extra sounds or murmurs. JB      Assessment:  Michael Bleyer. is a 66 y.o. male with COPD who presents with recent onset of cough, nasal congestions and mild wheezing mostly likely due to recent viral illness.  Supporting evidence includes sick contact (wife), course of illness, and upper respiratory symptoms.  Less likely to be related to acute worsening of COPD as there has been no recent worsening of SOB, no increase need for O2, however there has been a reported increase in use of albuterol and mild  wheezing is present on ascultation.  Not concerned for pneumonia at this time as there have been no fevers and no crackles/rales are heard on ascultation.  Can't rule out bacterial URI but patient is currently taking azithromycin which would treat any possible bacterial infections.     Plan:     Viral URI: -start taking mucinex 600 mg q12 hrs until symptoms resolve -Cheratussin prescription given -finish 5 day course of Azithromycin -continue good intake of fluids and solids -predinisone not needed at this point -return to clinic if new onset of fevers, worsening of SOB, or increase need for O2    See Problem List for additional A/P data. JB

## 2012-04-04 NOTE — Patient Instructions (Addendum)
It was great to see you today!  Continue to take your Azithromycin until you have completed the 5 days  Pick up Mucinex over the counter at a drug store.  Take 1 tablet every 12 hrs until your symptoms go away  We gave you a refill for the Cheratussin (cough syrup)  You do not need to start the prednisone at this time  We are giving you your annual Flu shot today!   Return to the clinic if you notice that your breathing worsens, you develop fevers, or if you notice an increase need for oxygen

## 2012-04-05 DIAGNOSIS — J069 Acute upper respiratory infection, unspecified: Secondary | ICD-10-CM | POA: Insufficient documentation

## 2012-05-17 ENCOUNTER — Encounter: Payer: Self-pay | Admitting: Family Medicine

## 2012-05-17 ENCOUNTER — Ambulatory Visit (INDEPENDENT_AMBULATORY_CARE_PROVIDER_SITE_OTHER): Payer: Medicare Other | Admitting: Family Medicine

## 2012-05-17 VITALS — BP 132/75 | HR 63 | Temp 98.3°F | Wt 159.0 lb

## 2012-05-17 DIAGNOSIS — M719 Bursopathy, unspecified: Secondary | ICD-10-CM

## 2012-05-17 DIAGNOSIS — E7800 Pure hypercholesterolemia, unspecified: Secondary | ICD-10-CM

## 2012-05-17 DIAGNOSIS — I10 Essential (primary) hypertension: Secondary | ICD-10-CM

## 2012-05-17 DIAGNOSIS — J449 Chronic obstructive pulmonary disease, unspecified: Secondary | ICD-10-CM

## 2012-05-17 DIAGNOSIS — M67919 Unspecified disorder of synovium and tendon, unspecified shoulder: Secondary | ICD-10-CM | POA: Diagnosis not present

## 2012-05-17 DIAGNOSIS — M75100 Unspecified rotator cuff tear or rupture of unspecified shoulder, not specified as traumatic: Secondary | ICD-10-CM

## 2012-05-17 LAB — LDL CHOLESTEROL, DIRECT: Direct LDL: 141 mg/dL — ABNORMAL HIGH

## 2012-05-17 MED ORDER — PREDNISONE 20 MG PO TABS: 60.0000 mg | ORAL_TABLET | Freq: Every day | ORAL | Status: DC

## 2012-05-17 MED ORDER — AZITHROMYCIN 500 MG PO TABS: 500.0000 mg | ORAL_TABLET | Freq: Every day | ORAL | Status: DC

## 2012-05-17 NOTE — Progress Notes (Signed)
  Subjective:    Patient ID: Michael Robertson., male    DOB: 1946/06/02, 66 y.o.   MRN: [id number]  HPI Getting over recent cold.  Not quite back to normal on breathing.  Rt. Shoulder pain for several months.  Worse with certain movements especially raising the arm Got separated and then married another.  A good woman who is also a Engineer, civil (consulting).  He is happy. Up to date on health maint. He was given 3 months to improve his chol.  Following very healthy diet.  Time for recheck..   Review of Systems     Objective:   Physical Exam Rt shoulder exam consistent with rotator cuff syndrome Lungs still end exp wheeze.       Assessment & Plan:

## 2012-05-17 NOTE — Assessment & Plan Note (Addendum)
Recheck on improved diet.  Still elevated.  Will start on meds.

## 2012-05-17 NOTE — Assessment & Plan Note (Signed)
Severe but stable. 

## 2012-05-17 NOTE — Assessment & Plan Note (Signed)
Well controled. 

## 2012-05-17 NOTE — Patient Instructions (Addendum)
I will call with lab results. Be happy in your new marriage. Be careful of crowds for the next several months.  We are entering the cold and flu season.  You know how bad that can affect.   See me in March.  Sooner if problems.  I don't want you coming in to the office unless you have to. Remember the shoulder exercises.  I can always give you a cortisone shot if it gets worse.

## 2012-05-18 DIAGNOSIS — M75100 Unspecified rotator cuff tear or rupture of unspecified shoulder, not specified as traumatic: Secondary | ICD-10-CM | POA: Insufficient documentation

## 2012-05-18 MED ORDER — PRAVASTATIN SODIUM 40 MG PO TABS: 40.0000 mg | ORAL_TABLET | Freq: Every evening | ORAL | Status: DC

## 2012-05-18 NOTE — Assessment & Plan Note (Signed)
Mild at this point.  Will Rx with exercises and activity modification.

## 2012-08-04 ENCOUNTER — Telehealth: Payer: Self-pay | Admitting: Family Medicine

## 2012-08-04 MED ORDER — CEPHALEXIN 500 MG PO CAPS: 500.0000 mg | ORAL_CAPSULE | Freq: Three times a day (TID) | ORAL | Status: DC

## 2012-08-04 NOTE — Telephone Encounter (Signed)
Called and discussed.  Will Rx empirically for prostatitis.  Will need to be seen if symptoms do not resolve.

## 2012-08-04 NOTE — Telephone Encounter (Signed)
Called pt to get more information regarding his back pain. Pt states when he urinates it burns. He states this has been going on for 1 week. Wondering if its his prostate. He also stated that he is having some grayish d/c, denies any fever n/v or abdominal pain. Pt states his wife is not having any symptoms. He has not had sexual relations with anyone else nor has his wife. Told pt that I would forward to Dr. [name] for advice.Loralee Pacas [place]

## 2012-08-04 NOTE — Telephone Encounter (Signed)
Patient has some questions for Dr. [name] about his back pain.

## 2012-10-14 ENCOUNTER — Other Ambulatory Visit: Payer: Self-pay | Admitting: Family Medicine

## 2012-10-20 ENCOUNTER — Ambulatory Visit (INDEPENDENT_AMBULATORY_CARE_PROVIDER_SITE_OTHER): Payer: Medicare Other | Admitting: Family Medicine

## 2012-10-20 ENCOUNTER — Encounter: Payer: Self-pay | Admitting: Family Medicine

## 2012-10-20 VITALS — BP 138/78 | HR 70 | Temp 98.8°F | Ht 71.0 in | Wt 166.0 lb

## 2012-10-20 DIAGNOSIS — E7800 Pure hypercholesterolemia, unspecified: Secondary | ICD-10-CM | POA: Diagnosis not present

## 2012-10-20 DIAGNOSIS — J449 Chronic obstructive pulmonary disease, unspecified: Secondary | ICD-10-CM

## 2012-10-20 DIAGNOSIS — I10 Essential (primary) hypertension: Secondary | ICD-10-CM

## 2012-10-20 LAB — COMPLETE METABOLIC PANEL WITH GFR
ALT: 19 U/L (ref 0–53)
AST: 21 U/L (ref 0–37)
Albumin: 4.5 g/dL (ref 3.5–5.2)
Alkaline Phosphatase: 51 U/L (ref 39–117)
BUN: 19 mg/dL (ref 6–23)
CO2: 31 mEq/L (ref 19–32)
Calcium: 9.5 mg/dL (ref 8.4–10.5)
Chloride: 101 mEq/L (ref 96–112)
Creat: 1.36 mg/dL — ABNORMAL HIGH (ref 0.50–1.35)
GFR, Est African American: 62 mL/min
GFR, Est Non African American: 53 mL/min — ABNORMAL LOW
Glucose, Bld: 95 mg/dL (ref 70–99)
Potassium: 3.9 mEq/L (ref 3.5–5.3)
Sodium: 140 mEq/L (ref 135–145)
Total Bilirubin: 0.5 mg/dL (ref 0.3–1.2)
Total Protein: 6.7 g/dL (ref 6.0–8.3)

## 2012-10-20 LAB — LDL CHOLESTEROL, DIRECT: Direct LDL: 94 mg/dL

## 2012-10-20 MED ORDER — FLUTICASONE PROPIONATE 50 MCG/ACT NA SUSP: 2.0000 | Freq: Every day | NASAL | Status: DC

## 2012-10-20 NOTE — Progress Notes (Signed)
  Subjective:    Patient ID: Michael Robertson., male    DOB: 26-Feb-1946, 67 y.o.   MRN: [id number]  HPI Koven feels well both physically and emotionally.  Married life is treating him well.  He breathing has stabilized nicely.  He feels physically fit enough for intercourse 2-3 times per week.    His only recent complaint is that breathing is not quite as good for the past one week with warm weather and spring allergies.  He does have untreated allergic rhinitis.   Review of Systems     Objective:   Physical ExamBoggy nasal mucosa No sig neck adenopathy Lungs prolong exp phase with diffuse, mild wheeze Ext no edema        Assessment & Plan:

## 2012-10-20 NOTE — Assessment & Plan Note (Signed)
Nicely stable.  I am worried about a flair with spring allergies.  Will add flonase.

## 2012-10-20 NOTE — Patient Instructions (Signed)
I will call with the lab results. Use the nasal spray every day.

## 2012-10-20 NOTE — Assessment & Plan Note (Signed)
Stable

## 2012-10-24 ENCOUNTER — Encounter: Payer: Self-pay | Admitting: Family Medicine

## 2012-11-10 ENCOUNTER — Other Ambulatory Visit: Payer: Self-pay | Admitting: Family Medicine

## 2012-11-13 ENCOUNTER — Other Ambulatory Visit: Payer: Self-pay | Admitting: Family Medicine

## 2012-12-10 IMAGING — CR DG CHEST 2V
2 series · 2 of 2 positions shown · non-contrast
Comparison: 09/12/2010

CLINICAL DATA: Follow up pneumonia

CHEST - 2 VIEW

[w chest lat (1 of 2)]
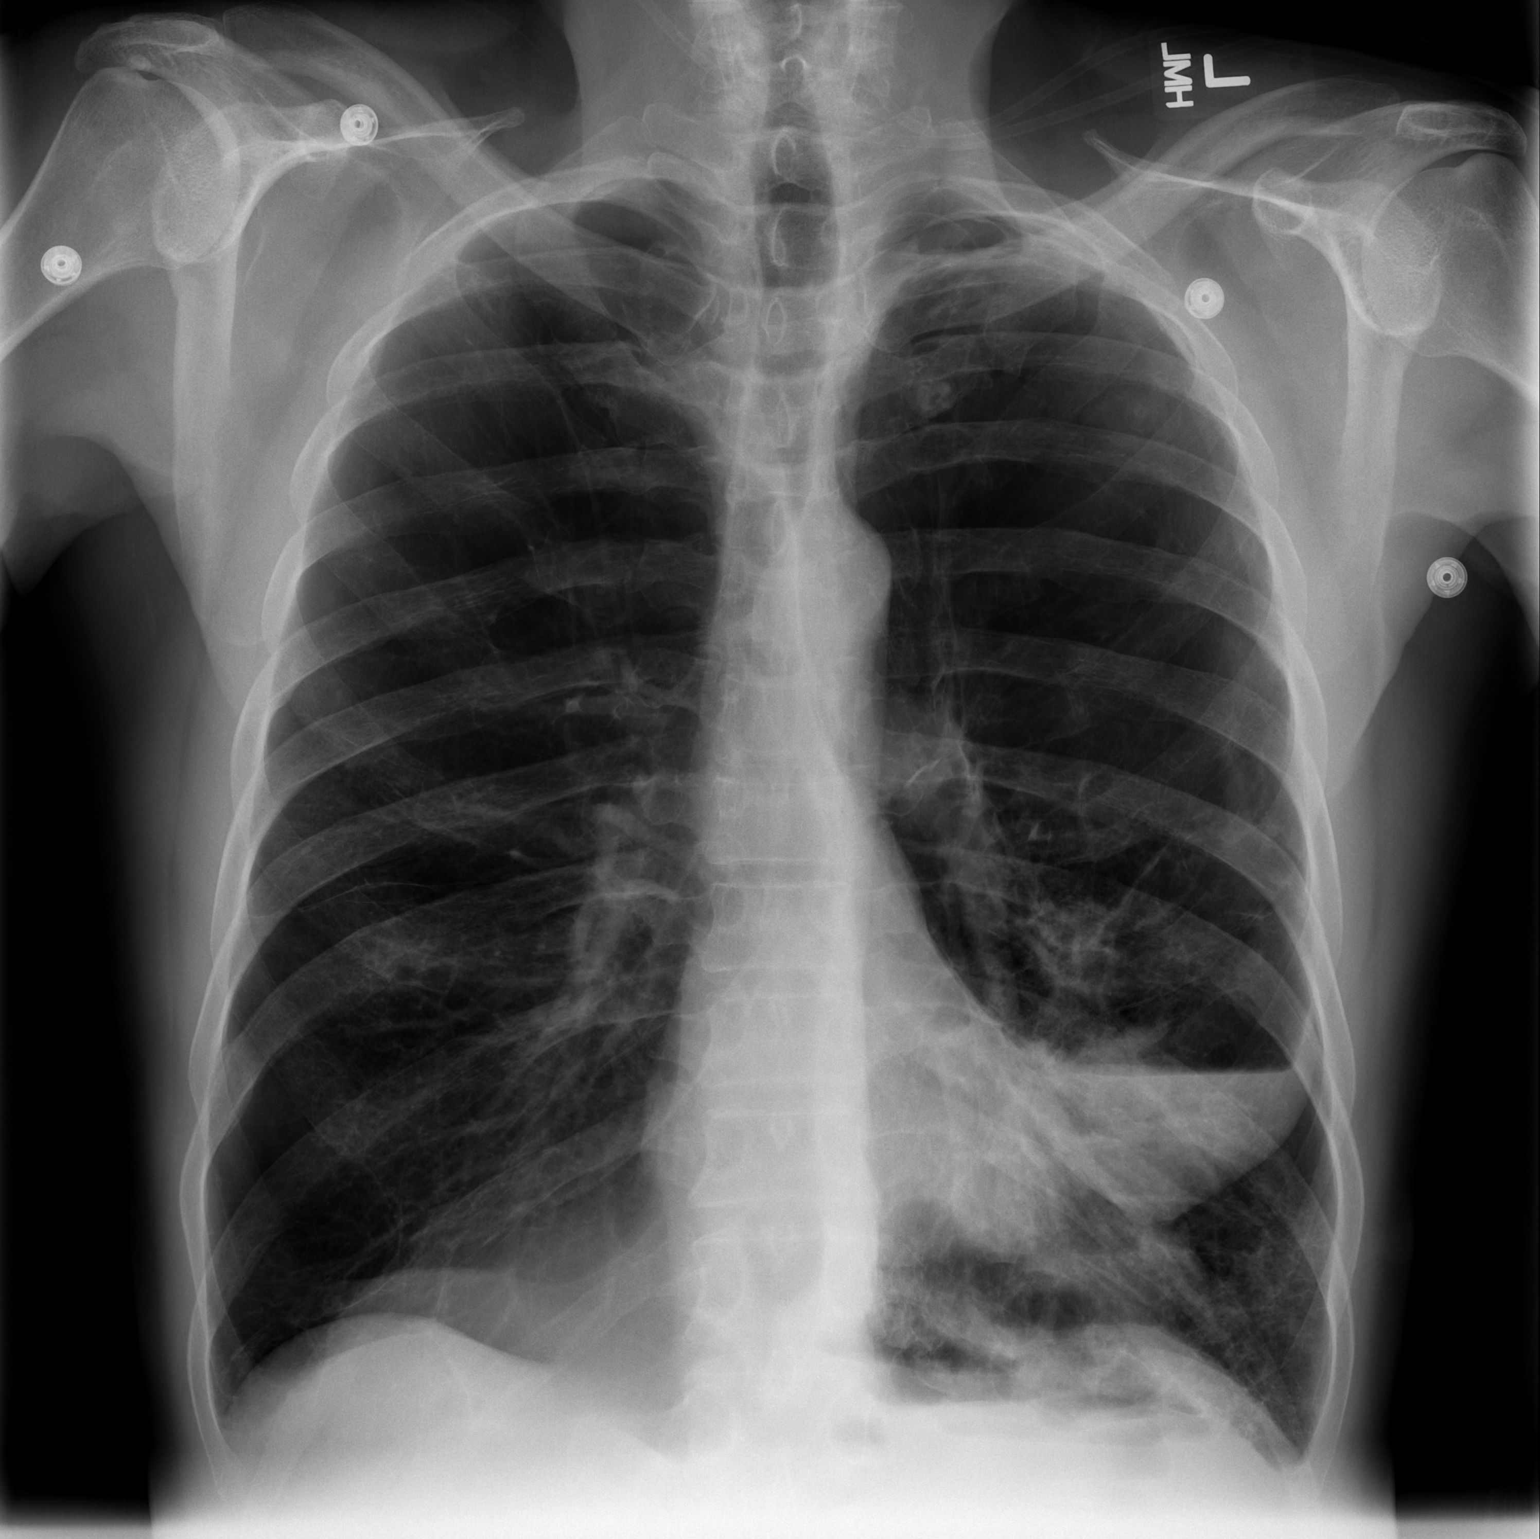

[w chest lat (2 of 2)]
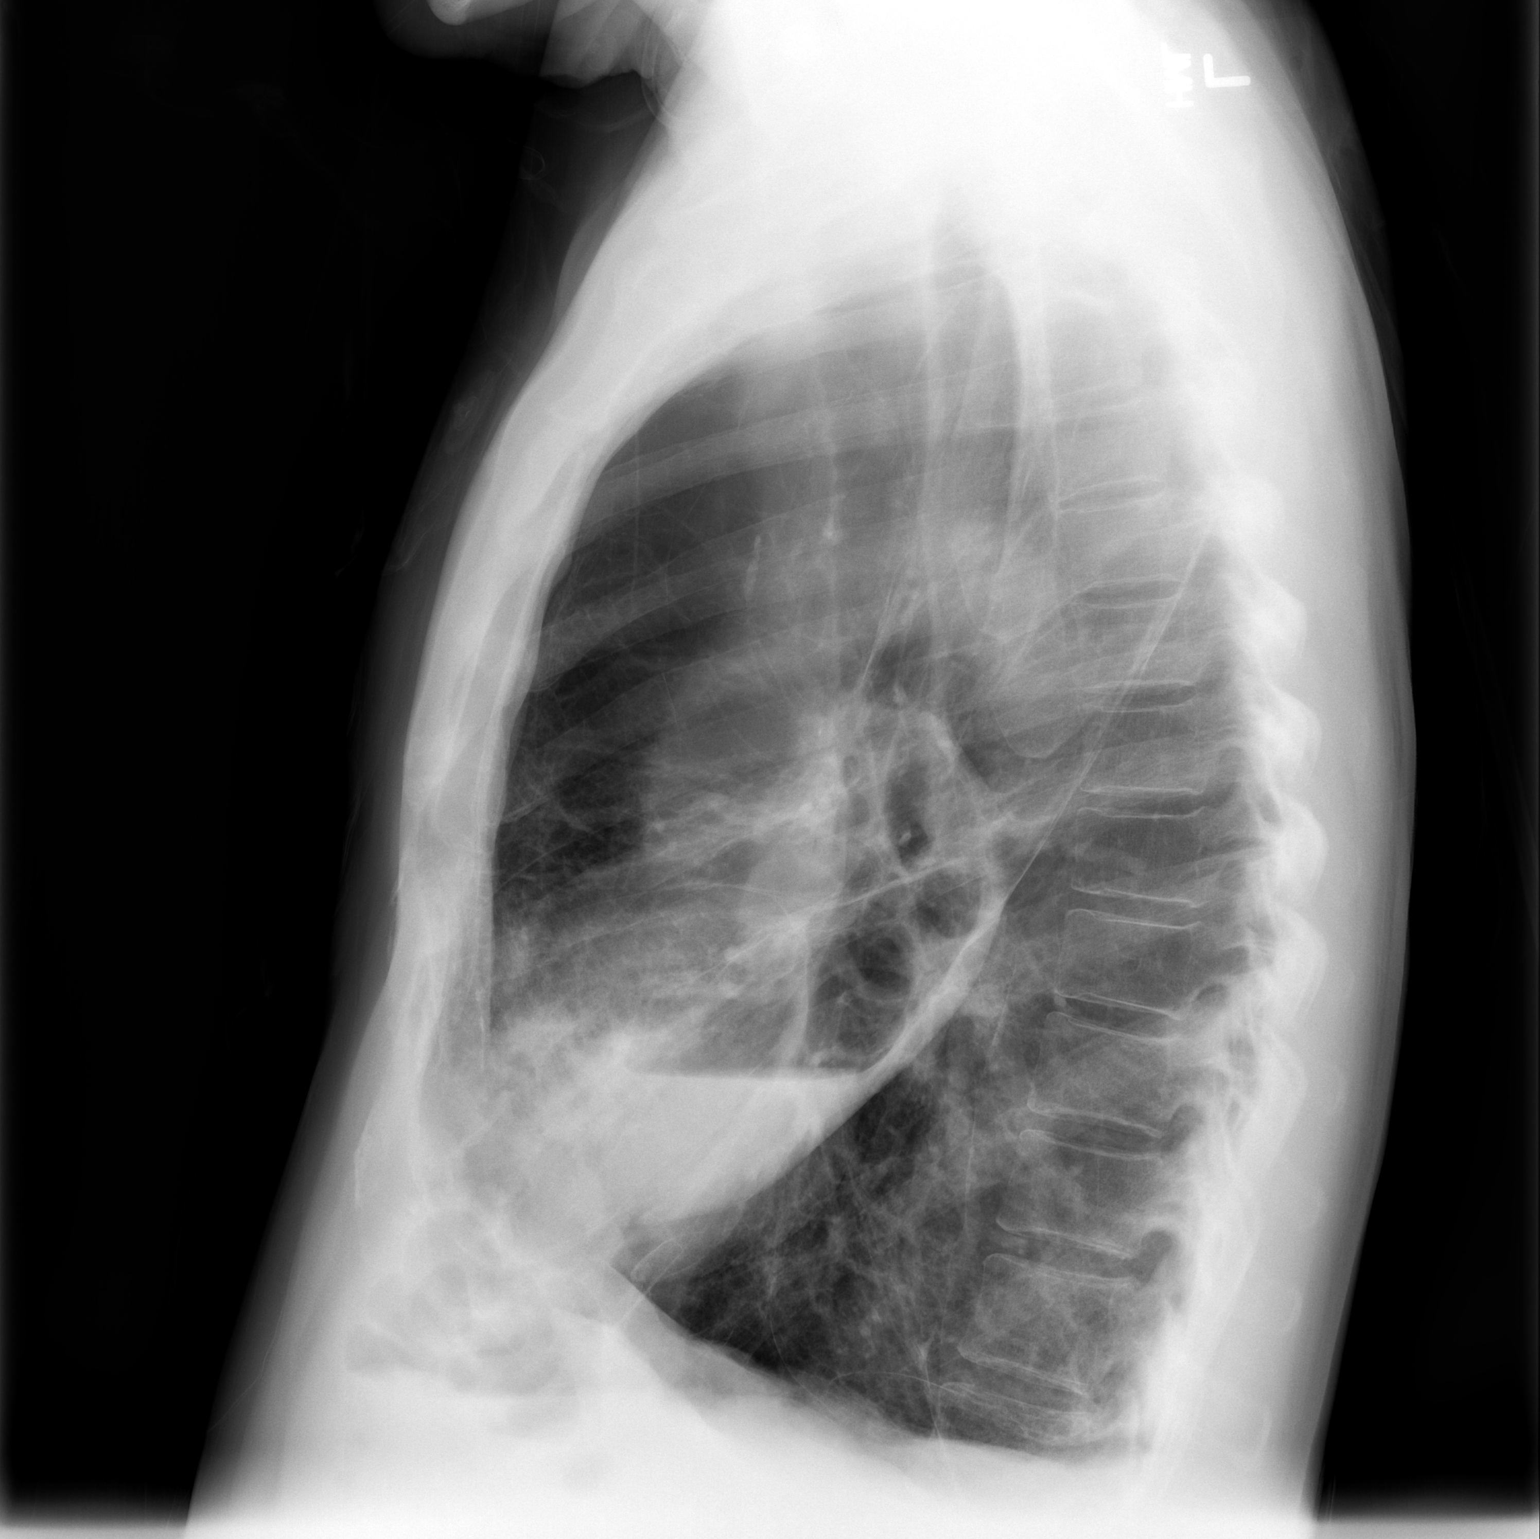

[2 of 2 positions shown; findings below may reference images not displayed]

FINDINGS: The heart size is normal.

The fluid level identified within the left lower lobe may be
reflect the presence of that loculated hydropneumothorax or fluid
within the large pulmonary bolus.

There is increased airspace consolidation within the lingular
portion of the left lung.

The right lung is relatively clear.  There is advanced changes of
emphysema.
IMPRESSION: 1.  Air-fluid level in the left base.  Consistent with either
loculated hydropneumothorax or pleural fluid accumulating within
the large lingular bulla.
2.  Left base airspace consolidation, increased from previous
study.

## 2012-12-15 ENCOUNTER — Other Ambulatory Visit: Payer: Self-pay | Admitting: *Deleted

## 2012-12-15 ENCOUNTER — Other Ambulatory Visit: Payer: Self-pay | Admitting: Family Medicine

## 2012-12-15 DIAGNOSIS — E7800 Pure hypercholesterolemia, unspecified: Secondary | ICD-10-CM

## 2012-12-15 MED ORDER — FLUTICASONE-SALMETEROL 250-50 MCG/DOSE IN AEPB: 1.0000 | INHALATION_SPRAY | Freq: Two times a day (BID) | RESPIRATORY_TRACT | Status: DC

## 2012-12-15 MED ORDER — ALBUTEROL SULFATE HFA 108 (90 BASE) MCG/ACT IN AERS: 2.0000 | INHALATION_SPRAY | RESPIRATORY_TRACT | Status: DC | PRN

## 2012-12-15 MED ORDER — PRAVASTATIN SODIUM 40 MG PO TABS: 40.0000 mg | ORAL_TABLET | Freq: Every evening | ORAL | Status: DC

## 2012-12-15 NOTE — Telephone Encounter (Signed)
Requested Prescriptions   Pending Prescriptions Disp Refills  . albuterol (VENTOLIN HFA) 108 (90 BASE) MCG/ACT inhaler 3 Inhaler 3    Sig: Inhale 2 puffs into the lungs every 4 (four) hours as needed. Rescue shortness of breath  . Fluticasone-Salmeterol (ADVAIR DISKUS) 250-50 MCG/DOSE AEPB 180 each 3    Sig: Inhale 1 puff into the lungs 2 (two) times daily.

## 2013-03-15 ENCOUNTER — Other Ambulatory Visit: Payer: Self-pay | Admitting: Family Medicine

## 2013-03-26 ENCOUNTER — Other Ambulatory Visit: Payer: Self-pay | Admitting: Family Medicine

## 2013-04-02 ENCOUNTER — Other Ambulatory Visit: Payer: Self-pay | Admitting: Family Medicine

## 2013-04-02 MED ORDER — GUAIFENESIN-CODEINE 100-10 MG/5ML PO SYRP: ORAL_SOLUTION | ORAL | Status: DC

## 2013-04-02 MED ORDER — TAMSULOSIN HCL 0.4 MG PO CAPS: ORAL_CAPSULE | ORAL | Status: DC

## 2013-04-06 ENCOUNTER — Encounter: Payer: Self-pay | Admitting: Family Medicine

## 2013-04-06 ENCOUNTER — Ambulatory Visit (INDEPENDENT_AMBULATORY_CARE_PROVIDER_SITE_OTHER): Payer: Medicare Other | Admitting: Family Medicine

## 2013-04-06 VITALS — BP 152/72 | HR 83 | Temp 98.5°F | Ht 71.0 in | Wt 171.0 lb

## 2013-04-06 DIAGNOSIS — J449 Chronic obstructive pulmonary disease, unspecified: Secondary | ICD-10-CM

## 2013-04-06 DIAGNOSIS — N486 Induration penis plastica: Secondary | ICD-10-CM | POA: Diagnosis not present

## 2013-04-06 DIAGNOSIS — I10 Essential (primary) hypertension: Secondary | ICD-10-CM | POA: Diagnosis not present

## 2013-04-06 DIAGNOSIS — Z23 Encounter for immunization: Secondary | ICD-10-CM

## 2013-04-06 DIAGNOSIS — E7800 Pure hypercholesterolemia, unspecified: Secondary | ICD-10-CM | POA: Diagnosis not present

## 2013-04-06 LAB — LIPID PANEL
Cholesterol: 139 mg/dL (ref 0–200)
HDL: 35 mg/dL — ABNORMAL LOW (ref >39)
LDL Cholesterol: 78 mg/dL (ref 0–99)
Total CHOL/HDL Ratio: 4 Ratio
Triglycerides: 128 mg/dL (ref <150)
VLDL: 26 mg/dL (ref 0–40)

## 2013-04-06 MED ORDER — PREDNISONE 20 MG PO TABS: 60.0000 mg | ORAL_TABLET | Freq: Every day | ORAL | Status: DC

## 2013-04-06 MED ORDER — AZITHROMYCIN 500 MG PO TABS: 500.0000 mg | ORAL_TABLET | Freq: Every day | ORAL | Status: DC

## 2013-04-06 NOTE — Patient Instructions (Addendum)
You have something called Peyronie disease.  Google that for more information.  I doubt it needs any treatment. No change in blood pressure medications.  Cut back on the salt. I refilled your prednisone and antibiotic You also got the new pneumonia vaccine today. I will call with blood work results

## 2013-04-06 NOTE — Progress Notes (Signed)
  Subjective:    Patient ID: Michael Robertson., male    DOB: Jul 02, 1946, 67 y.o.   MRN: [id number]  HPI COPD stable. C/O bent erection.  No pain or skin problems. Has gained wt nicely since now married to a good cook.   Review of Systems     Objective:   Physical ExamLungs bilateral end exp wheeze Abd benign        Assessment & Plan:

## 2013-04-06 NOTE — Assessment & Plan Note (Signed)
Minimally symptomatic.  No uro referral for now

## 2013-04-06 NOTE — Addendum Note (Signed)
Addended by: Tanna Savoy on: 04/06/2013 04:15 PM   Modules accepted: Orders

## 2013-04-06 NOTE — Assessment & Plan Note (Signed)
Check labs 

## 2013-04-06 NOTE — Assessment & Plan Note (Signed)
Refill meds for flairs

## 2013-04-06 NOTE — Assessment & Plan Note (Signed)
Low salt diet.  Wife tattles that he has been over using salt.

## 2013-08-23 ENCOUNTER — Other Ambulatory Visit: Payer: Self-pay | Admitting: Family Medicine

## 2013-09-20 ENCOUNTER — Other Ambulatory Visit: Payer: Self-pay | Admitting: Family Medicine

## 2013-09-28 ENCOUNTER — Other Ambulatory Visit: Payer: Self-pay | Admitting: Family Medicine

## 2013-09-28 ENCOUNTER — Ambulatory Visit (INDEPENDENT_AMBULATORY_CARE_PROVIDER_SITE_OTHER): Payer: Medicare Other | Admitting: Family Medicine

## 2013-09-28 ENCOUNTER — Encounter: Payer: Self-pay | Admitting: Family Medicine

## 2013-09-28 VITALS — BP 138/72 | HR 68 | Temp 98.1°F | Ht 71.0 in | Wt 174.9 lb

## 2013-09-28 DIAGNOSIS — J449 Chronic obstructive pulmonary disease, unspecified: Secondary | ICD-10-CM | POA: Diagnosis not present

## 2013-09-28 DIAGNOSIS — K219 Gastro-esophageal reflux disease without esophagitis: Secondary | ICD-10-CM | POA: Diagnosis not present

## 2013-09-28 DIAGNOSIS — N183 Chronic kidney disease, stage 3 unspecified: Secondary | ICD-10-CM

## 2013-09-28 DIAGNOSIS — Z515 Encounter for palliative care: Secondary | ICD-10-CM | POA: Diagnosis not present

## 2013-09-28 DIAGNOSIS — Z23 Encounter for immunization: Secondary | ICD-10-CM

## 2013-09-28 DIAGNOSIS — I10 Essential (primary) hypertension: Secondary | ICD-10-CM | POA: Diagnosis not present

## 2013-09-28 NOTE — Assessment & Plan Note (Signed)
Good control

## 2013-09-28 NOTE — Assessment & Plan Note (Signed)
Has been bothered some despite chronic omeprozole.  Found he does better if he remains hydrated and takes his omeprazole in the morning.

## 2013-09-28 NOTE — Progress Notes (Signed)
   Subjective:    Patient ID: Michael LundHenry W Kluger Jr., male    DOB: 07/24/1945, 68 y.o.   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  Remains on all meds.  Feels good. No new complaints.   Did palliative counseling.  He would want short term but not long term vent support.  Does not have living will or HCPOA    Review of Systems     Objective:   Physical Exam Lungs poor air movement, prolonged exp phase and ext wheeze - his baseline. Thin skinned with multiple scratches.         Assessment & Plan:

## 2013-09-28 NOTE — Assessment & Plan Note (Signed)
Stable

## 2013-09-28 NOTE — Patient Instructions (Signed)
I will call with blood test results You get a tetanus shot today. You really look pretty good. I recommend you fill out a living will and health care power of attorney.   See me in 6 months if you are doing well - right away if problem.

## 2013-09-28 NOTE — Addendum Note (Signed)
Addended by: Jimmy FootmanEVANS, Emon Lance K on: 09/28/2013 02:09 PM   Modules accepted: Orders

## 2013-09-28 NOTE — Assessment & Plan Note (Signed)
Given [place] handout on advanced directives and urged him and spouse to complete.

## 2013-09-28 NOTE — Assessment & Plan Note (Signed)
Recheck labs 

## 2013-09-29 LAB — BASIC METABOLIC PANEL WITH GFR
BUN: 15 mg/dL (ref 6–23)
CO2: 29 meq/L (ref 19–32)
Calcium: 9.5 mg/dL (ref 8.4–10.5)
Chloride: 101 meq/L (ref 96–112)
Creat: 1.33 mg/dL (ref 0.50–1.35)
Glucose, Bld: 102 mg/dL — ABNORMAL HIGH (ref 70–99)
Potassium: 4.2 meq/L (ref 3.5–5.3)
Sodium: 139 meq/L (ref 135–145)

## 2013-10-02 ENCOUNTER — Encounter: Payer: Self-pay | Admitting: Family Medicine

## 2013-10-09 ENCOUNTER — Other Ambulatory Visit: Payer: Self-pay | Admitting: Family Medicine

## 2013-11-02 ENCOUNTER — Other Ambulatory Visit: Payer: Self-pay | Admitting: Family Medicine

## 2013-11-05 ENCOUNTER — Other Ambulatory Visit: Payer: Self-pay | Admitting: Family Medicine

## 2013-11-05 ENCOUNTER — Other Ambulatory Visit: Payer: Self-pay | Admitting: *Deleted

## 2013-11-05 MED ORDER — TAMSULOSIN HCL 0.4 MG PO CAPS: ORAL_CAPSULE | ORAL | Status: DC

## 2014-01-08 ENCOUNTER — Other Ambulatory Visit: Payer: Self-pay | Admitting: Family Medicine

## 2014-02-13 ENCOUNTER — Other Ambulatory Visit: Payer: Self-pay | Admitting: Family Medicine

## 2014-04-10 ENCOUNTER — Encounter: Payer: Self-pay | Admitting: Family Medicine

## 2014-04-10 ENCOUNTER — Ambulatory Visit (INDEPENDENT_AMBULATORY_CARE_PROVIDER_SITE_OTHER): Payer: Medicare Other | Admitting: Family Medicine

## 2014-04-10 VITALS — BP 111/72 | HR 92 | Temp 98.7°F | Ht 71.0 in | Wt 168.0 lb

## 2014-04-10 DIAGNOSIS — R7309 Other abnormal glucose: Secondary | ICD-10-CM | POA: Diagnosis not present

## 2014-04-10 DIAGNOSIS — J440 Chronic obstructive pulmonary disease with acute lower respiratory infection: Secondary | ICD-10-CM

## 2014-04-10 DIAGNOSIS — J209 Acute bronchitis, unspecified: Secondary | ICD-10-CM

## 2014-04-10 DIAGNOSIS — Z23 Encounter for immunization: Secondary | ICD-10-CM | POA: Diagnosis not present

## 2014-04-10 DIAGNOSIS — J4489 Other specified chronic obstructive pulmonary disease: Secondary | ICD-10-CM | POA: Diagnosis not present

## 2014-04-10 DIAGNOSIS — J449 Chronic obstructive pulmonary disease, unspecified: Secondary | ICD-10-CM

## 2014-04-10 DIAGNOSIS — R739 Hyperglycemia, unspecified: Secondary | ICD-10-CM

## 2014-04-10 DIAGNOSIS — I10 Essential (primary) hypertension: Secondary | ICD-10-CM | POA: Diagnosis not present

## 2014-04-10 DIAGNOSIS — N183 Chronic kidney disease, stage 3 unspecified: Secondary | ICD-10-CM

## 2014-04-10 MED ORDER — AZITHROMYCIN 500 MG PO TABS: 500.0000 mg | ORAL_TABLET | Freq: Every day | ORAL | Status: DC

## 2014-04-10 MED ORDER — GUAIFENESIN-CODEINE 100-10 MG/5ML PO SYRP: ORAL_SOLUTION | ORAL | Status: DC

## 2014-04-10 MED ORDER — PREDNISONE 20 MG PO TABS: 60.0000 mg | ORAL_TABLET | Freq: Every day | ORAL | Status: AC

## 2014-04-10 NOTE — Patient Instructions (Signed)
I will call with the blood work results. You got a flu shot today.   See me in 6 months.

## 2014-04-11 LAB — CBC
HCT: 41.4 % (ref 39.0–52.0)
Hemoglobin: 13.7 g/dL (ref 13.0–17.0)
MCH: 31 pg (ref 26.0–34.0)
MCHC: 33.1 g/dL (ref 30.0–36.0)
MCV: 93.7 fL (ref 78.0–100.0)
Platelets: 298 10*3/uL (ref 150–400)
RBC: 4.42 MIL/uL (ref 4.22–5.81)
RDW: 13.2 % (ref 11.5–15.5)
WBC: 9.5 10*3/uL (ref 4.0–10.5)

## 2014-04-11 LAB — TSH: TSH: 1.606 u[IU]/mL (ref 0.350–4.500)

## 2014-04-11 LAB — BASIC METABOLIC PANEL
BUN: 19 mg/dL (ref 6–23)
CO2: 30 mEq/L (ref 19–32)
Calcium: 9.4 mg/dL (ref 8.4–10.5)
Chloride: 100 mEq/L (ref 96–112)
Creat: 1.43 mg/dL — ABNORMAL HIGH (ref 0.50–1.35)
Glucose, Bld: 88 mg/dL (ref 70–99)
Potassium: 4.5 mEq/L (ref 3.5–5.3)
Sodium: 138 mEq/L (ref 135–145)

## 2014-04-11 NOTE — Assessment & Plan Note (Signed)
Nicely stable at this time 

## 2014-04-11 NOTE — Assessment & Plan Note (Signed)
Stable on current regimen.  No change.  Flu shot.

## 2014-04-11 NOTE — Progress Notes (Signed)
   Subjective:    Patient ID: Michael Lund., male    DOB: [birth year], 68 y.o.   MRN: [id number]  HPI Michael Robertson is in for a recheck of COPD, refill of meds and review chronic problems.  Needs flu shot. Taking all meds.  COPD seems to be under decent control.  O2 dependant marking him as severe COPD.  Still able to get around and enjoy life.  Was able to make a trip to visit family in [place].  He is one of the patients I trust to early treatment of his own exacerbations with refillable Rxes for azithro and prednisone.  He is averaging a flair every two months.    Insomnia is an issue, only partially treated by trazodone.  As we discussed the timing, it became clear to him that the insomnia was worse around the times when he was on or just coming off prednisone.  After much discussion, we decided to leave his trazodone unchanged.  Needs flu shot.  Otherwise up to date on HPDP    Review of Systems     Objective:   Physical Exam Lungs, bilateral exp wheeze/whistles. Cardiac RRR without m or g Ext no edema        Assessment & Plan:

## 2014-04-11 NOTE — Assessment & Plan Note (Signed)
No hyperglycemia at this time

## 2014-04-12 ENCOUNTER — Ambulatory Visit: Payer: Medicare Other | Admitting: Family Medicine

## 2014-04-18 ENCOUNTER — Encounter: Payer: Self-pay | Admitting: Gastroenterology

## 2014-06-01 ENCOUNTER — Other Ambulatory Visit: Payer: Self-pay | Admitting: Family Medicine

## 2014-08-09 ENCOUNTER — Other Ambulatory Visit: Payer: Self-pay | Admitting: Family Medicine

## 2014-09-06 ENCOUNTER — Encounter: Payer: Self-pay | Admitting: Gastroenterology

## 2014-09-21 ENCOUNTER — Other Ambulatory Visit: Payer: Self-pay | Admitting: Family Medicine

## 2014-09-21 DIAGNOSIS — N401 Enlarged prostate with lower urinary tract symptoms: Secondary | ICD-10-CM

## 2014-09-21 DIAGNOSIS — E7800 Pure hypercholesterolemia, unspecified: Secondary | ICD-10-CM

## 2014-09-21 DIAGNOSIS — R3912 Poor urinary stream: Principal | ICD-10-CM

## 2014-09-23 NOTE — Assessment & Plan Note (Signed)
Refill per e request 

## 2014-10-04 ENCOUNTER — Encounter: Payer: Self-pay | Admitting: Family Medicine

## 2014-10-04 ENCOUNTER — Ambulatory Visit (INDEPENDENT_AMBULATORY_CARE_PROVIDER_SITE_OTHER): Payer: Medicare Other | Admitting: Family Medicine

## 2014-10-04 VITALS — BP 145/70 | HR 71 | Temp 97.8°F | Wt 169.6 lb

## 2014-10-04 DIAGNOSIS — I10 Essential (primary) hypertension: Secondary | ICD-10-CM

## 2014-10-04 DIAGNOSIS — J439 Emphysema, unspecified: Secondary | ICD-10-CM

## 2014-10-04 DIAGNOSIS — N183 Chronic kidney disease, stage 3 unspecified: Secondary | ICD-10-CM

## 2014-10-04 DIAGNOSIS — J431 Panlobular emphysema: Secondary | ICD-10-CM | POA: Diagnosis not present

## 2014-10-04 DIAGNOSIS — E7800 Pure hypercholesterolemia, unspecified: Secondary | ICD-10-CM

## 2014-10-04 DIAGNOSIS — J309 Allergic rhinitis, unspecified: Secondary | ICD-10-CM | POA: Insufficient documentation

## 2014-10-04 DIAGNOSIS — J301 Allergic rhinitis due to pollen: Secondary | ICD-10-CM | POA: Diagnosis not present

## 2014-10-04 LAB — BASIC METABOLIC PANEL
BUN: 22 mg/dL (ref 6–23)
CO2: 28 mEq/L (ref 19–32)
Calcium: 9.1 mg/dL (ref 8.4–10.5)
Chloride: 100 mEq/L (ref 96–112)
Creat: 1.33 mg/dL (ref 0.50–1.35)
Glucose, Bld: 87 mg/dL (ref 70–99)
Potassium: 3.7 mEq/L (ref 3.5–5.3)
Sodium: 140 mEq/L (ref 135–145)

## 2014-10-04 LAB — LIPID PANEL
Cholesterol: 164 mg/dL (ref 0–200)
HDL: 42 mg/dL (ref >=40)
LDL Cholesterol: 83 mg/dL (ref 0–99)
Total CHOL/HDL Ratio: 3.9 Ratio
Triglycerides: 194 mg/dL — ABNORMAL HIGH (ref <150)
VLDL: 39 mg/dL (ref 0–40)

## 2014-10-04 MED ORDER — GUAIFENESIN-CODEINE 100-10 MG/5ML PO SYRP: ORAL_SOLUTION | ORAL | Status: DC

## 2014-10-04 MED ORDER — FLUTICASONE PROPIONATE 50 MCG/ACT NA SUSP: NASAL | Status: DC

## 2014-10-04 NOTE — Patient Instructions (Signed)
You look good. No changes.   I sent in a new script for the flonase. I will give you a script for the cough medicine. I will call with lab results I think it is fine to do every 10 year colon cancer screening.  Next due in 2021 See me in six months, sooner if problems

## 2014-10-04 NOTE — Assessment & Plan Note (Signed)
Recheck labs 

## 2014-10-04 NOTE — Progress Notes (Signed)
   Subjective:    Patient ID: Michael LundHenry W Alling Jr., male    DOB: [birth year], 69 y.o.   MRN: [id number]  HPI Recheck COPD, etc.  Doing well.  No admits.  He is responsible and has refillable pred and doxy at home to take when flairs.  2 flairs in last six months. Up to date on health maint.  Some confusion around when needs repeat screening colonoscopy.  Had in 2011 and was normal.  GI recommended 5 year FU due to family hx positive for colon ca.  Given severe COPD, we jointly decided that 10 year FU was OK Would like to restart his flonase since spring allergy season has arrived.    Review of Systems     Objective:   Physical Exam Lungs Poor tidal volume.  Rare end exp wheeze.  Barrel chest. Cardiac RRR without m or g       Assessment & Plan:

## 2014-10-04 NOTE — Assessment & Plan Note (Signed)
Has managed himself well over the past 6 months.  No changes.

## 2014-10-04 NOTE — Assessment & Plan Note (Signed)
Refill flonase

## 2014-10-04 NOTE — Assessment & Plan Note (Signed)
Stable with goal <150 systolic

## 2014-10-09 ENCOUNTER — Other Ambulatory Visit: Payer: Self-pay | Admitting: Family Medicine

## 2014-10-18 ENCOUNTER — Other Ambulatory Visit: Payer: Self-pay | Admitting: Family Medicine

## 2014-12-05 ENCOUNTER — Other Ambulatory Visit: Payer: Self-pay | Admitting: Family Medicine

## 2015-01-01 ENCOUNTER — Other Ambulatory Visit: Payer: Self-pay | Admitting: Family Medicine

## 2015-01-13 ENCOUNTER — Other Ambulatory Visit: Payer: Self-pay

## 2015-02-16 ENCOUNTER — Other Ambulatory Visit: Payer: Self-pay | Admitting: Family Medicine

## 2015-02-17 ENCOUNTER — Telehealth: Payer: Self-pay | Admitting: Family Medicine

## 2015-02-17 ENCOUNTER — Encounter: Payer: Self-pay | Admitting: Family Medicine

## 2015-02-17 MED ORDER — FLUTICASONE-SALMETEROL 250-50 MCG/DOSE IN AEPB: INHALATION_SPRAY | RESPIRATORY_TRACT | Status: DC

## 2015-02-17 NOTE — Telephone Encounter (Signed)
Pt called to check the status of his refill request on Advair. I told him we sent it to [facility]. He would like to know if we can call in some to [facility] at [facility] since it can take up to 10 days to get this through the mail and he is out. Myriam Jacobson

## 2015-02-17 NOTE — Telephone Encounter (Signed)
Refill done Patient notified. 

## 2015-02-17 NOTE — Telephone Encounter (Signed)
Pt called because he is out of Advair and needs refills. He would like [place] to send this prescription to a local pharmacy so that he can pick this up today. Please SEND to [facility] at [facility]. jw

## 2015-04-03 ENCOUNTER — Ambulatory Visit (INDEPENDENT_AMBULATORY_CARE_PROVIDER_SITE_OTHER): Payer: Medicare Other | Admitting: Family Medicine

## 2015-04-03 VITALS — BP 132/72 | HR 72 | Temp 97.9°F | Wt 168.0 lb

## 2015-04-03 DIAGNOSIS — J439 Emphysema, unspecified: Secondary | ICD-10-CM

## 2015-04-03 DIAGNOSIS — Z23 Encounter for immunization: Secondary | ICD-10-CM

## 2015-04-03 DIAGNOSIS — I10 Essential (primary) hypertension: Secondary | ICD-10-CM | POA: Diagnosis not present

## 2015-04-03 MED ORDER — PREDNISONE 20 MG PO TABS: 20.0000 mg | ORAL_TABLET | Freq: Three times a day (TID) | ORAL | Status: DC

## 2015-04-03 MED ORDER — GUAIFENESIN-CODEINE 100-10 MG/5ML PO SYRP: ORAL_SOLUTION | ORAL | Status: DC

## 2015-04-03 MED ORDER — AZITHROMYCIN 500 MG PO TABS: 500.0000 mg | ORAL_TABLET | Freq: Every day | ORAL | Status: DC

## 2015-04-03 NOTE — Patient Instructions (Signed)
See me in March - 6 months. I refilled your medications. You will get a flu shot today. The nurse will check to see if you need another pneumonia vaccine. Stay well

## 2015-04-04 ENCOUNTER — Encounter: Payer: Self-pay | Admitting: Family Medicine

## 2015-04-04 DIAGNOSIS — I10 Essential (primary) hypertension: Secondary | ICD-10-CM | POA: Diagnosis not present

## 2015-04-04 DIAGNOSIS — J439 Emphysema, unspecified: Secondary | ICD-10-CM | POA: Diagnosis not present

## 2015-04-04 DIAGNOSIS — Z23 Encounter for immunization: Secondary | ICD-10-CM | POA: Diagnosis present

## 2015-04-04 NOTE — Assessment & Plan Note (Signed)
Stable, no change in meds.   

## 2015-04-04 NOTE — Progress Notes (Signed)
   Subjective:    Patient ID: Michael Robertson., male    DOB: 01/15/1946, 69 y.o.   MRN: [id number]  HPI Doing well.  His COPD is nicely stable.  I provide him with Rx for steroids and antibiotics to start at the sign of a flair.  He needs to take a course every other month.  He does well with this and has only been hospitalized once in the last 5 years (with pneumonia.)  Here for a refill of these meds and a flu shot.   Weight stable. No leg swelling.  Does well on home O2 at 3l/min.  He has one flight dyspnea on exertion which is stable.  Labs 6 months ago were reassuring.    Review of Systems No chest pain or syncope.  No change in weight, appetite, bowel or bladder.  No bleeding.     Objective:   Physical ExamLungs clear - does have prolonged exp phase.        Assessment & Plan:

## 2015-05-18 ENCOUNTER — Other Ambulatory Visit: Payer: Self-pay | Admitting: Family Medicine

## 2015-07-18 ENCOUNTER — Other Ambulatory Visit: Payer: Self-pay | Admitting: Family Medicine

## 2015-08-18 ENCOUNTER — Other Ambulatory Visit: Payer: Self-pay | Admitting: Family Medicine

## 2015-08-22 ENCOUNTER — Encounter: Payer: Self-pay | Admitting: Internal Medicine

## 2015-08-22 ENCOUNTER — Ambulatory Visit (INDEPENDENT_AMBULATORY_CARE_PROVIDER_SITE_OTHER): Payer: Medicare Other | Admitting: Internal Medicine

## 2015-08-22 VITALS — BP 133/86 | HR 78 | Temp 98.1°F | Ht 71.0 in | Wt 176.2 lb

## 2015-08-22 DIAGNOSIS — Z113 Encounter for screening for infections with a predominantly sexual mode of transmission: Secondary | ICD-10-CM | POA: Insufficient documentation

## 2015-08-22 NOTE — Assessment & Plan Note (Signed)
While patient does not have any risk factors for Hepatitis C, given his birth year it is recommended that he undergo routine Hep C screening. Explained to him that he doesn't have any risk factors and that the Hep C antibody will tell us if he has ever been exposed. Also explained the potential for a reflex Hep C RNA.  -Hep C antibody with reflex Hep C RNA ordered  -will call patient with results

## 2015-08-22 NOTE — Patient Instructions (Signed)
Thanks for coming in today! It was nice to meet you and your wife. I will call you with the results of your Hepatitis C screening.   Take Care,   Dr. Earlene Plater

## 2015-08-22 NOTE — Progress Notes (Signed)
Subjective:    Michael Robertson. - 70 y.o. male MRN [id number]  Date of birth: [birth year]  HPI  Michael Robertson. is here for Hepatitis C screening. Says he has seen a lot of ads on TV about the need for screening given his age and he is curious. Denies h/o IV drug use, blood transfusion prior to 1992, h/o of STDs, and history of liver problems. Denies abdominal pain, N/V and fevers. Reports recent COPD exacerbation that he successfully managed at home.    Health Maintenance Due  Topic Date Due  . Hepatitis C Screening  1946-06-26    -  reports that he quit smoking about 11 years ago. His smoking use included Cigarettes. He has never used smokeless tobacco.  - Past Medical History: Patient Active Problem List   Diagnosis Date Noted  . Screening for STD (sexually transmitted disease) 08/22/2015  . Allergic rhinitis 10/04/2014  . Peyronie disease 04/06/2013  . Rotator cuff syndrome 05/18/2012  . Hypercholesteremia 02/14/2012  . CKD (chronic kidney disease) stage 3, GFR 30-59 ml/min 08/23/2011  . Hyperglycemia 08/23/2011  . Palliative care encounter 08/20/2011  . Insomnia 04/09/2011  . ERECTILE DYSFUNCTION 12/03/2009  . G E R D 08/20/2009  . Benign prostatic hypertrophy (BPH) with weak urinary stream 08/20/2009  . HYPERTENSION, BENIGN SYSTEMIC 09/15/2006  . COPD (chronic obstructive pulmonary disease) with emphysema (HCC) 09/15/2006   - Medications: reviewed and updated Current Outpatient Prescriptions  Medication Sig Dispense Refill  . azithromycin (ZITHROMAX) 500 MG tablet Take 1 tablet (500 mg total) by mouth daily. Take when breathing worsens. 5 tablet 6  . bisoprolol (ZEBETA) 5 MG tablet TAKE 1 TABLET DAILY 90 tablet 3  . fluticasone (FLONASE) 50 MCG/ACT nasal spray USE 2 SPRAYS NASALLY DAILY 32 g 2  . Fluticasone-Salmeterol (ADVAIR DISKUS) 250-50 MCG/DOSE AEPB USE 1 INHALATION INTO THE LUNGS TWICE A DAY 60 each 3  . guaiFENesin-codeine (CHERATUSSIN AC) 100-10 MG/5ML  syrup TAKE ONE TEASPOONFUL BY MOUTH TWICE DAILY AS NEEDED FOR COUGH. 120 mL 5  . hydrochlorothiazide (HYDRODIURIL) 25 MG tablet TAKE 1 TABLET DAILY 90 tablet 2  . lisinopril (PRINIVIL,ZESTRIL) 10 MG tablet TAKE 1 TABLET DAILY 90 tablet 3  . omeprazole (PRILOSEC) 40 MG capsule TAKE 1 CAPSULE DAILY 90 capsule 3  . pravastatin (PRAVACHOL) 40 MG tablet TAKE 1 TABLET EVERY EVENING 90 tablet 3  . predniSONE (DELTASONE) 20 MG tablet Take 1 tablet (20 mg total) by mouth 3 (three) times daily. 15 tablet 12  . SPIRIVA HANDIHALER 18 MCG inhalation capsule INHALE THE CONTENTS OF 1 CAPSULE WITH 2 INHALATIONS ONCE DAILY 1 capsule 11  . tamsulosin (FLOMAX) 0.4 MG CAPS capsule TAKE 1 CAPSULE DAILY FOR PROSTATE 90 capsule 3  . traZODone (DESYREL) 100 MG tablet TAKE 1 TABLET AT BEDTIME 90 tablet 3  . VENTOLIN HFA 108 (90 Base) MCG/ACT inhaler USE 2 INHALATIONS EVERY 4 HOURS AS NEEDED. RESCUE FOR SHORTNESS OF BREATH 54 g 1  . VIAGRA 100 MG tablet TAKE 1 TABLET AS NEEDED FOR ERECTILE DYSFUNCTION 18 tablet 2   No current facility-administered medications for this visit.     Review of Systems See HPI     Objective:   Physical Exam BP 133/86 mmHg  Pulse 78  Temp(Src) 98.1 F (36.7 C) (Oral)  Ht  (1.803 m)  Wt 176 lb 3.2 oz (79.924 kg)  BMI 24.59 kg/m2 Gen: NAD, alert, cooperative with exam, well-appearing CV: RRR, good S1/S2, no murmur Resp: O2 by  Chewey in place, good air movement, occasional faint wheeze appreciated     Assessment & Plan:   Screening for STD (sexually transmitted disease) While patient does not have any risk factors for Hepatitis C, given his birth year it is recommended that he undergo routine Hep C screening. Explained to him that he doesn't have any risk factors and that the Hep C antibody will tell us if he has ever been exposed. Also explained the potential for a reflex Hep C RNA.  -Hep C antibody with reflex Hep C RNA ordered  -will call patient with results       Marcy Siren, D.O. 08/22/2015, 11:39 AM PGY-1, [facility]

## 2015-08-23 LAB — HEPATITIS C ANTIBODY: HCV Ab: NEGATIVE

## 2015-08-25 ENCOUNTER — Encounter: Payer: Self-pay | Admitting: Internal Medicine

## 2015-08-28 ENCOUNTER — Telehealth: Payer: Self-pay | Admitting: Family Medicine

## 2015-08-28 NOTE — Telephone Encounter (Signed)
Pt called and would like to have his lab results. jw °

## 2015-08-28 NOTE — Telephone Encounter (Signed)
Called and informed negative

## 2015-09-16 ENCOUNTER — Other Ambulatory Visit: Payer: Self-pay | Admitting: Family Medicine

## 2015-09-26 ENCOUNTER — Other Ambulatory Visit: Payer: Self-pay | Admitting: Family Medicine

## 2015-10-11 ENCOUNTER — Other Ambulatory Visit: Payer: Self-pay | Admitting: Family Medicine

## 2015-10-23 ENCOUNTER — Ambulatory Visit (INDEPENDENT_AMBULATORY_CARE_PROVIDER_SITE_OTHER): Payer: Medicare Other | Admitting: Family Medicine

## 2015-10-23 ENCOUNTER — Encounter: Payer: Self-pay | Admitting: Family Medicine

## 2015-10-23 DIAGNOSIS — E7800 Pure hypercholesterolemia, unspecified: Secondary | ICD-10-CM | POA: Diagnosis not present

## 2015-10-23 DIAGNOSIS — J439 Emphysema, unspecified: Secondary | ICD-10-CM

## 2015-10-23 DIAGNOSIS — I10 Essential (primary) hypertension: Secondary | ICD-10-CM

## 2015-10-23 LAB — COMPLETE METABOLIC PANEL WITH GFR
ALT: 21 U/L (ref 9–46)
AST: 27 U/L (ref 10–35)
Albumin: 4.3 g/dL (ref 3.6–5.1)
Alkaline Phosphatase: 46 U/L (ref 40–115)
BUN: 14 mg/dL (ref 7–25)
CO2: 29 mmol/L (ref 20–31)
Calcium: 9.2 mg/dL (ref 8.6–10.3)
Chloride: 100 mmol/L (ref 98–110)
Creat: 1.29 mg/dL — ABNORMAL HIGH (ref 0.70–1.18)
GFR, Est African American: 65 mL/min (ref >=60)
GFR, Est Non African American: 56 mL/min — ABNORMAL LOW (ref >=60)
Glucose, Bld: 90 mg/dL (ref 65–99)
Potassium: 4 mmol/L (ref 3.5–5.3)
Sodium: 140 mmol/L (ref 135–146)
Total Bilirubin: 0.6 mg/dL (ref 0.2–1.2)
Total Protein: 6.9 g/dL (ref 6.1–8.1)

## 2015-10-23 LAB — LIPID PANEL
Cholesterol: 153 mg/dL (ref 125–200)
HDL: 34 mg/dL — ABNORMAL LOW (ref >=40)
LDL Cholesterol: 79 mg/dL (ref <130)
Total CHOL/HDL Ratio: 4.5 Ratio (ref <=5.0)
Triglycerides: 202 mg/dL — ABNORMAL HIGH (ref <150)
VLDL: 40 mg/dL — ABNORMAL HIGH (ref <30)

## 2015-10-23 MED ORDER — FLUTICASONE-SALMETEROL 500-50 MCG/DOSE IN AEPB: 1.0000 | INHALATION_SPRAY | Freq: Two times a day (BID) | RESPIRATORY_TRACT | Status: DC

## 2015-10-23 NOTE — Patient Instructions (Addendum)
I am pushing you to a higher dose of the Advair.   See me in May to see if I want to make a second change. I will call with blood test results.

## 2015-10-23 NOTE — Assessment & Plan Note (Signed)
Due for labs

## 2015-10-23 NOTE — Assessment & Plan Note (Addendum)
Max out Advair.  F/U 6 weeks.  Consider add theophylline. No evidence of Right heart failure.  Doubt diuretic would help.

## 2015-10-23 NOTE — Progress Notes (Signed)
   Subjective:    Patient ID: Michael Robertson., male    DOB: [birth year], 70 y.o.   MRN: [id number]  HPI  COPD has been worse this winter.  He has had to treat COPD exac a couple of times with his standing RX for prednisone and antibiotics.  Today he is fine.  He just is not as controled as he was 6 months ago.  No current fever or chills.  Minimal ankle swelling.  Marked DOE.  No chest pain.  He would like to get well enough to take a trip to the [place] in [place].  He knows the risks and is willing to take them.  Tolerating statin well.  Due for cholesterol check  Tolerating BP meds well.  Due for BP check    Review of Systems     Objective:   Physical Exam Pulse ox = 88% on 2 l/min of O2.   Lungs no wheeze.  Diffusely prolonged exp phase. Cardiac RRR without m or g      Assessment & Plan:

## 2015-10-24 MED ORDER — GUAIFENESIN-CODEINE 100-10 MG/5ML PO SYRP: ORAL_SOLUTION | ORAL | Status: DC

## 2015-10-24 NOTE — Addendum Note (Signed)
Addended by: Moses MannersHENSEL, Salima Rumer A on: 10/24/2015 08:47 AM   Modules accepted: Orders

## 2015-12-04 ENCOUNTER — Ambulatory Visit (INDEPENDENT_AMBULATORY_CARE_PROVIDER_SITE_OTHER): Payer: Medicare Other | Admitting: Family Medicine

## 2015-12-04 ENCOUNTER — Encounter: Payer: Self-pay | Admitting: Family Medicine

## 2015-12-04 VITALS — BP 137/70 | HR 67 | Temp 98.0°F | Ht 71.0 in | Wt 171.1 lb

## 2015-12-04 DIAGNOSIS — J439 Emphysema, unspecified: Secondary | ICD-10-CM | POA: Diagnosis not present

## 2015-12-04 DIAGNOSIS — J9611 Chronic respiratory failure with hypoxia: Secondary | ICD-10-CM

## 2015-12-04 NOTE — Progress Notes (Signed)
   Subjective:    Patient ID: Michael LundHenry W Vessels Jr., male    DOB: 1946/06/22, 70 y.o.   MRN: [id number]  HPI Patient with COPD (emphysema type) and chronic hypoxic resp failure seen last month and increased his inhaled steroid dose.  Returns today for FU.  He can tell the difference with modest but definite improvement.  Had an exacerbation requiring systemic steroids immediately after last visit but none for the last one month.   Wears his O2 24/7.  Can walk to car from [place] 50 yards stopping once.      Review of Systems     Objective:   Physical Exam No wheeze, decreased tidal volume.  Increased AP diameter.   No JVD or peripheral edema       Assessment & Plan:

## 2015-12-04 NOTE — Assessment & Plan Note (Signed)
Modest but important improvement on higher dose of inhaled steroids. 

## 2015-12-04 NOTE — Assessment & Plan Note (Signed)
Modest but important improvement on higher dose of inhaled steroids.

## 2015-12-04 NOTE — Patient Instructions (Signed)
Keep things the same.   See me in 3-6 months, sooner if problems.

## 2015-12-17 ENCOUNTER — Encounter: Payer: Self-pay | Admitting: Family Medicine

## 2015-12-17 ENCOUNTER — Ambulatory Visit (INDEPENDENT_AMBULATORY_CARE_PROVIDER_SITE_OTHER): Payer: Medicare Other | Admitting: Family Medicine

## 2015-12-17 VITALS — BP 145/72 | HR 79 | Temp 98.1°F | Wt 167.6 lb

## 2015-12-17 DIAGNOSIS — K219 Gastro-esophageal reflux disease without esophagitis: Secondary | ICD-10-CM

## 2015-12-17 DIAGNOSIS — R110 Nausea: Secondary | ICD-10-CM | POA: Diagnosis not present

## 2015-12-17 LAB — POCT H PYLORI SCREEN: H Pylori Screen, POC: NEGATIVE

## 2015-12-17 MED ORDER — ONDANSETRON 4 MG PO TBDP: 4.0000 mg | ORAL_TABLET | Freq: Three times a day (TID) | ORAL | Status: DC | PRN

## 2015-12-17 NOTE — Patient Instructions (Signed)
Thank you for coming in today!  - Let's check for H. pylori and treat it if you've got it.   Our clinic's number is [phone]. Feel free to call any time with questions or concerns. We will answer any questions after hours with our 24-hour emergency line at that number as well.   - Dr. Jarvis NewcomerGrunz

## 2015-12-17 NOTE — Progress Notes (Signed)
Subjective: Michael LundHenry W Brissette Jr. is a 70 y.o. male accompanied by his wife, presenting for nausea.  He reports mild to moderate nausea over the past week without emesis or abdominal pain. This is overall improving somewhat, but he wanted to get it checked out. He has a history of GERD but denies recent heartburn, dysphagia. Further denies constipation, diarrhea, blood in stool. His breathing is stable, though he took 5 days of azithromycin as instructed to do so by his PCP if he felt a COPD exacerbation were starting. This has helped his symptoms, namely wheezing, so much that he denies trouble breathing, wheezing, cough, or fever. His wife was recently hospitalized due to anemia with negative colonoscopy and EGD for bleeding source. The work up is continuing as an outpatient, but she was diagnosed with H. pylori at that time, and is undergoing treatment. He also noted 2 ticks on his right leg and left foot which could not have been present for more than 24 hours prior to removal. This was about 2 weeks ago. He denies headache, rash, or other constitutional symptoms.   - ROS: As above - Former smoker - History of COPD on home O2, CKD stage III, HTN.  - Allergy documented to doxycycline  Objective: BP 145/72 mmHg  Pulse 79  Temp(Src) 98.1 F (36.7 C) (Oral)  Wt 167 lb 9.6 oz (76.023 kg) Gen: Tall, thin, well-appearing 70 y.o. male in no distress CV: Regular rate, no murmur; no LE edema, no JVD, cap refill < 2 sec. Pulm: Non-labored breathing 2L O2, normal rate, scattered very mild end expiratory wheezes with prolonged expiration and good air movement.  GI: Normoactive-BS; soft, non-tender, non-distended, no organomegaly, no hernia appreciated. No scars.  Skin: Nonspecific sub centimeter papules on medial right lower leg and left 4th toe without surrounding erythema. No other rashes, wounds, ulcers  Assessment/Plan: Michael LundHenry W Gaby Jr. is a 70 y.o. male here for nausea.  With history of GERD, this  may be related dyspepsia, possibly exacerbated by the stress of his wife's recent hospitalization. Though pt was recently on azithromycin and is currently on PPI, H. pylori breath test was performed and is negative. Pt is at high risk for complications with EGD with chronic respiratory failure. Benign abd exam and lack of constitutional symptoms makes imaging/CBC/CMP lower yield. Per discussion with the pt and wife, in the absence of other characteristic symptoms related to RMSF, I do not believe this is related to tick bites. We agreed to try antiemetic prn and observe for now. Return precautions reviewed.

## 2016-01-04 ENCOUNTER — Other Ambulatory Visit: Payer: Self-pay | Admitting: Family Medicine

## 2016-03-25 ENCOUNTER — Other Ambulatory Visit: Payer: Self-pay | Admitting: Family Medicine

## 2016-04-07 ENCOUNTER — Ambulatory Visit (INDEPENDENT_AMBULATORY_CARE_PROVIDER_SITE_OTHER): Payer: Medicare Other | Admitting: Family Medicine

## 2016-04-07 ENCOUNTER — Encounter: Payer: Self-pay | Admitting: Family Medicine

## 2016-04-07 DIAGNOSIS — J439 Emphysema, unspecified: Secondary | ICD-10-CM | POA: Diagnosis not present

## 2016-04-07 DIAGNOSIS — J9611 Chronic respiratory failure with hypoxia: Secondary | ICD-10-CM | POA: Diagnosis not present

## 2016-04-07 DIAGNOSIS — Z23 Encounter for immunization: Secondary | ICD-10-CM

## 2016-04-07 DIAGNOSIS — Z515 Encounter for palliative care: Secondary | ICD-10-CM

## 2016-04-07 DIAGNOSIS — H9113 Presbycusis, bilateral: Secondary | ICD-10-CM | POA: Insufficient documentation

## 2016-04-07 NOTE — Assessment & Plan Note (Signed)
Similar conclusions as above.  Plans to fill out living willl

## 2016-04-07 NOTE — Progress Notes (Signed)
   Subjective:    Patient ID: Michael LundHenry W Postiglione Jr., male    DOB: 09-Dec-1945, 70 y.o.   MRN: [id number]  Constipation    Patient with severe O2 requiring COPD presents for a periodic recheck and flu shot.  Likes to not come in during winter to avoid cold flu exposure in our waiting room.   Doing well.  He has recurring Rx for pred and doxy to take during flairs.  Only 2 flairs since last check.  Feels better than last spring - he has been more active this summer.  We spent time problem solving to figure out how to maintain physical activity during winter when he does not go out to avoid infections.   Also complains of slowly progressive hearing loss. Needs flu shot. Also discussed end of life care.  He and spouse plan to discuss and complete living wills.     Review of Systems  Gastrointestinal: Positive for constipation.       Objective:   Physical Exam Bilateral cerumen impactions.  Easily able to irrigate right clear with some subjective improvement in hearing.  Despite multiple attempts, could not clear deep impaction from left ear. Lungs no wheeze.  Prolonged exp phase.  Decreased vital capacity.         Assessment & Plan:

## 2016-04-07 NOTE — Assessment & Plan Note (Signed)
Stable, continue current treatment.  Flu shot.  Avoid crowds during winter.

## 2016-04-07 NOTE — Assessment & Plan Note (Signed)
Stable on current meds 

## 2016-04-07 NOTE — Patient Instructions (Addendum)
Flu shot today. Talk to each other about the living wills and what you want at the end of life. Try to stay out of my office until March or April to avoid the colds and flu. Try to stay active this winter.  Find the sweet spot of exercise.   Life is good. I put in an audiology referral

## 2016-04-07 NOTE — Assessment & Plan Note (Signed)
And cerumen impaction.  Right ear cleared.  Left ear residual impaction.  Send to audiologist to evaluate degree of loss and need for hearing aid.

## 2016-04-19 ENCOUNTER — Other Ambulatory Visit: Payer: Self-pay | Admitting: Family Medicine

## 2016-04-19 DIAGNOSIS — J439 Emphysema, unspecified: Secondary | ICD-10-CM

## 2016-05-11 ENCOUNTER — Other Ambulatory Visit: Payer: Self-pay | Admitting: Family Medicine

## 2016-05-11 DIAGNOSIS — J439 Emphysema, unspecified: Secondary | ICD-10-CM

## 2016-05-17 ENCOUNTER — Ambulatory Visit: Payer: Medicare Other | Attending: Audiology | Admitting: Audiology

## 2016-06-14 ENCOUNTER — Other Ambulatory Visit: Payer: Self-pay | Admitting: Family Medicine

## 2016-07-12 ENCOUNTER — Other Ambulatory Visit: Payer: Self-pay | Admitting: Family Medicine

## 2016-09-12 ENCOUNTER — Other Ambulatory Visit: Payer: Self-pay | Admitting: Family Medicine

## 2016-10-10 ENCOUNTER — Other Ambulatory Visit: Payer: Self-pay | Admitting: Family Medicine

## 2016-10-10 DIAGNOSIS — J439 Emphysema, unspecified: Secondary | ICD-10-CM

## 2016-10-28 ENCOUNTER — Encounter: Payer: Self-pay | Admitting: *Deleted

## 2016-10-28 ENCOUNTER — Ambulatory Visit (INDEPENDENT_AMBULATORY_CARE_PROVIDER_SITE_OTHER): Payer: Medicare Other | Admitting: *Deleted

## 2016-10-28 VITALS — BP 116/68 | HR 65 | Temp 98.2°F | Ht 71.0 in | Wt 162.6 lb

## 2016-10-28 DIAGNOSIS — Z0000 Encounter for general adult medical examination without abnormal findings: Secondary | ICD-10-CM | POA: Diagnosis not present

## 2016-10-28 NOTE — Patient Instructions (Signed)
 Fall Prevention in the Home Falls can cause injuries. They can happen to people of all ages. There are many things you can do to make your home safe and to help prevent falls. What can I do on the outside of my home?  Regularly fix the edges of walkways and driveways and fix any cracks.  Remove anything that might make you trip as you walk through a door, such as a raised step or threshold.  Trim any bushes or trees on the path to your home.  Use bright outdoor lighting.  Clear any walking paths of anything that might make someone trip, such as rocks or tools.  Regularly check to see if handrails are loose or broken. Make sure that both sides of any steps have handrails.  Any raised decks and porches should have guardrails on the edges.  Have any leaves, snow, or ice cleared regularly.  Use sand or salt on walking paths during winter.  Clean up any spills in your garage right away. This includes oil or grease spills. What can I do in the bathroom?  Use night lights.  Install grab bars by the toilet and in the tub and shower. Do not use towel bars as grab bars.  Use non-skid mats or decals in the tub or shower.  If you need to sit down in the shower, use a plastic, non-slip stool.  Keep the floor dry. Clean up any water that spills on the floor as soon as it happens.  Remove soap buildup in the tub or shower regularly.  Attach bath mats securely with double-sided non-slip rug tape.  Do not have throw rugs and other things on the floor that can make you trip. What can I do in the bedroom?  Use night lights.  Make sure that you have a light by your bed that is easy to reach.  Do not use any sheets or blankets that are too big for your bed. They should not hang down onto the floor.  Have a firm chair that has side arms. You can use this for support while you get dressed.  Do not have throw rugs and other things on the floor that can make you trip. What can I do in  the kitchen?  Clean up any spills right away.  Avoid walking on wet floors.  Keep items that you use a lot in easy-to-reach places.  If you need to reach something above you, use a strong step stool that has a grab bar.  Keep electrical cords out of the way.  Do not use floor polish or wax that makes floors slippery. If you must use wax, use non-skid floor wax.  Do not have throw rugs and other things on the floor that can make you trip. What can I do with my stairs?  Do not leave any items on the stairs.  Make sure that there are handrails on both sides of the stairs and use them. Fix handrails that are broken or loose. Make sure that handrails are as long as the stairways.  Check any carpeting to make sure that it is firmly attached to the stairs. Fix any carpet that is loose or worn.  Avoid having throw rugs at the top or bottom of the stairs. If you do have throw rugs, attach them to the floor with carpet tape.  Make sure that you have a light switch at the top of the stairs and the bottom of the stairs. If you   do not have them, ask someone to add them for you. What else can I do to help prevent falls?  Wear shoes that:  Do not have high heels.  Have rubber bottoms.  Are comfortable and fit you well.  Are closed at the toe. Do not wear sandals.  If you use a stepladder:  Make sure that it is fully opened. Do not climb a closed stepladder.  Make sure that both sides of the stepladder are locked into place.  Ask someone to hold it for you, if possible.  Clearly mark and make sure that you can see:  Any grab bars or handrails.  First and last steps.  Where the edge of each step is.  Use tools that help you move around (mobility aids) if they are needed. These include:  Canes.  Walkers.  Scooters.  Crutches.  Turn on the lights when you go into a dark area. Replace any light bulbs as soon as they burn out.  Set up your furniture so you have a clear  path. Avoid moving your furniture around.  If any of your floors are uneven, fix them.  If there are any pets around you, be aware of where they are.  Review your medicines with your doctor. Some medicines can make you feel dizzy. This can increase your chance of falling. Ask your doctor what other things that you can do to help prevent falls. This information is not intended to replace advice given to you by your health care provider. Make sure you discuss any questions you have with your health care provider. Document Released: 05/01/2009 Document Revised: 12/11/2015 Document Reviewed: 08/09/2014 Elsevier Interactive Patient Education  2017 Elsevier Inc[employer]   Health Maintenance, Male A healthy lifestyle and preventive care is important for your health and wellness. Ask your health care provider about what schedule of regular examinations is right for you. What should I know about weight and diet?  Eat a Healthy Diet  Eat plenty of vegetables, fruits, whole grains, low-fat dairy products, and lean protein.  Do not eat a lot of foods high in solid fats, added sugars, or salt. Maintain a Healthy Weight  Regular exercise can help you achieve or maintain a healthy weight. You should:  Do at least 150 minutes of exercise each week. The exercise should increase your heart rate and make you sweat (moderate-intensity exercise).  Do strength-training exercises at least twice a week. Watch Your Levels of Cholesterol and Blood Lipids  Have your blood tested for lipids and cholesterol every 5 years starting at 72 years of age. If you are at high risk for heart disease, you should start having your blood tested when you are 72 years old. You may need to have your cholesterol levels checked more often if:  Your lipid or cholesterol levels are high.  You are older than 72 years of age.  You are at high risk for heart disease. What should I know about cancer screening? Many types of cancers can  be detected early and may often be prevented. Lung Cancer  You should be screened every year for lung cancer if:  You are a current smoker who has smoked for at least 30 years.  You are a former smoker who has quit within the past 15 years.  Talk to your health care provider about your screening options, when you should start screening, and how often you should be screened. Colorectal Cancer  Routine colorectal cancer screening usually begins at 72 years   of age and should be repeated every 5-10 years until you are 72 years old. You may need to be screened more often if early forms of precancerous polyps or small growths are found. Your health care provider may recommend screening at an earlier age if you have risk factors for colon cancer.  Your health care provider may recommend using home test kits to check for hidden blood in the stool.  A small camera at the end of a tube can be used to examine your colon (sigmoidoscopy or colonoscopy). This checks for the earliest forms of colorectal cancer. Prostate and Testicular Cancer  Depending on your age and overall health, your health care provider may do certain tests to screen for prostate and testicular cancer.  Talk to your health care provider about any symptoms or concerns you have about testicular or prostate cancer. Skin Cancer  Check your skin from head to toe regularly.  Tell your health care provider about any new moles or changes in moles, especially if:  There is a change in a mole's size, shape, or color.  You have a mole that is larger than a pencil eraser.  Always use sunscreen. Apply sunscreen liberally and repeat throughout the day.  Protect yourself by wearing long sleeves, pants, a wide-brimmed hat, and sunglasses when outside. What should I know about heart disease, diabetes, and high blood pressure?  If you are 18-39 years of age, have your blood pressure checked every 3-5 years. If you are 40 years of age or  older, have your blood pressure checked every year. You should have your blood pressure measured twice-once when you are at a hospital or clinic, and once when you are not at a hospital or clinic. Record the average of the two measurements. To check your blood pressure when you are not at a hospital or clinic, you can use:  An automated blood pressure machine at a pharmacy.  A home blood pressure monitor.  Talk to your health care provider about your target blood pressure.  If you are between 45-79 years old, ask your health care provider if you should take aspirin to prevent heart disease.  Have regular diabetes screenings by checking your fasting blood sugar level.  If you are at a normal weight and have a low risk for diabetes, have this test once every three years after the age of 45.  If you are overweight and have a high risk for diabetes, consider being tested at a younger age or more often.  A one-time screening for abdominal aortic aneurysm (AAA) by ultrasound is recommended for men aged 65-75 years who are current or former smokers. What should I know about preventing infection? Hepatitis B  If you have a higher risk for hepatitis B, you should be screened for this virus. Talk with your health care provider to find out if you are at risk for hepatitis B infection. Hepatitis C  Blood testing is recommended for:  Everyone born from 1945 through 1965.  Anyone with known risk factors for hepatitis C. Sexually Transmitted Diseases (STDs)  You should be screened each year for STDs including gonorrhea and chlamydia if:  You are sexually active and are younger than 72 years of age.  You are older than 72 years of age and your health care provider tells you that you are at risk for this type of infection.  Your sexual activity has changed since you were last screened and you are at an increased risk for chlamydia   or gonorrhea. Ask your health care provider if you are at  risk.  Talk with your health care provider about whether you are at high risk of being infected with HIV. Your health care provider may recommend a prescription medicine to help prevent HIV infection. What else can I do?  Schedule regular health, dental, and eye exams.  Stay current with your vaccines (immunizations).  Do not use any tobacco products, such as cigarettes, chewing tobacco, and e-cigarettes. If you need help quitting, ask your health care provider.  Limit alcohol intake to no more than 2 drinks per day. One drink equals 12 ounces of beer, 5 ounces of wine, or 1 ounces of hard liquor.  Do not use street drugs.  Do not share needles.  Ask your health care provider for help if you need support or information about quitting drugs.  Tell your health care provider if you often feel depressed.  Tell your health care provider if you have ever been abused or do not feel safe at home. This information is not intended to replace advice given to you by your health care provider. Make sure you discuss any questions you have with your health care provider. Document Released: 01/01/2008 Document Revised: 03/03/2016 Document Reviewed: 04/08/2015 Elsevier Interactive Patient Education  2017 Elsevier Inc.  

## 2016-10-28 NOTE — Progress Notes (Signed)
Subjective:   Michael Robertson. is a 71 y.o. male who presents for Medicare Annual/Subsequent preventive examination.  Cardiac Risk Factors include: advanced age (>57men, >28 women);dyslipidemia;hypertension;male gender;smoking/ tobacco exposure     Objective:    Vitals: BP 116/68 (BP Location: Left Arm, Patient Position: Sitting, Cuff Size: Normal)   Pulse 65   Temp 98.2 F (36.8 C) (Oral)   Ht  (1.803 m)   Wt 162 lb 9.6 oz (73.8 kg)   SpO2 96% Comment: on 2L O2  BMI 22.68 kg/m   Body mass index is 22.68 kg/m.  Tobacco History  Smoking Status  . Former Smoker  . Packs/day: 1.75  . Years: 40.00  . Types: Cigarettes  . Quit date: 03/01/2004  Smokeless Tobacco  . Never Used     Patient is former smoker with no plans to restart   Past Medical History:  Diagnosis Date  . Chronic kidney disease   . COPD (chronic obstructive pulmonary disease) (HCC)   . GERD (gastroesophageal reflux disease)   . Hyperlipidemia   . Hypertension   . Insomnia    History reviewed. No pertinent surgical history. Family History  Problem Relation Age of Onset  . Alzheimer's disease Sister   . Diabetes Sister   . Stroke Mother   . Dementia Mother   . Diabetes Mother   . Cancer Father     colon  . Stroke Father   . Obesity Daughter   . Diabetes Daughter   . Hypertension Daughter    History  Sexual Activity  . Sexual activity: Yes    Outpatient Encounter Prescriptions as of 10/28/2016  Medication Sig  . ADVAIR DISKUS 500-50 MCG/DOSE AEPB USE 1 INHALATION TWICE A DAY  . azithromycin (ZITHROMAX) 500 MG tablet TAKE ONE TABLET BY MOUTH ONCE DAILY; TAKE WHEN BREATHING WORSENS  . bisoprolol (ZEBETA) 5 MG tablet TAKE 1 TABLET DAILY  . fluticasone (FLONASE) 50 MCG/ACT nasal spray USE 2 SPRAYS NASALLY DAILY  . guaiFENesin-codeine 100-10 MG/5ML syrup TAKE  5 ML TO 10 ML BY MOUTH TWICE DAILY AS NEEDED FOR COUGH  . hydrochlorothiazide (HYDRODIURIL) 25 MG tablet TAKE 1 TABLET DAILY   . lisinopril (PRINIVIL,ZESTRIL) 10 MG tablet TAKE 1 TABLET DAILY  . omeprazole (PRILOSEC) 40 MG capsule TAKE 1 CAPSULE DAILY  . pravastatin (PRAVACHOL) 40 MG tablet TAKE 1 TABLET EVERY EVENING  . predniSONE (DELTASONE) 20 MG tablet TAKE ONE TABLET BY MOUTH THREE TIMES DAILY  . SPIRIVA HANDIHALER 18 MCG inhalation capsule INHALE THE CONTENTS OF 1 CAPSULE WITH 2 INHALATIONS ONCE DAILY  . tamsulosin (FLOMAX) 0.4 MG CAPS capsule TAKE 1 CAPSULE DAILY FOR PROSTATE  . traZODone (DESYREL) 100 MG tablet TAKE 1 TABLET AT BEDTIME  . VENTOLIN HFA 108 (90 Base) MCG/ACT inhaler USE 2 INHALATIONS EVERY 4 HOURS AS NEEDED. RESCUE FOR SHORTNESS OF BREATH  . VIAGRA 100 MG tablet TAKE 1 TABLET AS NEEDED FOR ERECTILE DYSFUNCTION  . ondansetron (ZOFRAN-ODT) 4 MG disintegrating tablet Take 1 tablet (4 mg total) by mouth every 8 (eight) hours as needed for nausea or vomiting. (Patient not taking: Reported on 10/28/2016)   No facility-administered encounter medications on file as of 10/28/2016.     Activities of Daily Living In your present state of health, do you have any difficulty performing the following activities: 10/28/2016  Hearing? Y  Vision? N  Difficulty concentrating or making decisions? N  Walking or climbing stairs? Y  Dressing or bathing? N  Doing errands, shopping?  N  Preparing Food and eating ? N  Using the Toilet? N  In the past six months, have you accidently leaked urine? N  Do you have problems with loss of bowel control? N  Managing your Medications? N  Managing your Finances? N  Housekeeping or managing your Housekeeping? N  Some recent data might be hidden   Home Safety:  My home has a working smoke alarm:  Yes X 4 plus CO2 monitor            My home throw rugs have been fastened down to the floor or removed:  Removed I have non-slip mats in the bathtub and shower:  non-slip surfaces         All my home's stairs have railings or bannisters: Two level home with handrails. No outside  steps.         My home's floors, stairs and hallways are free from clutter, wires and cords:  Yes     I wear seatbelts consistently:  Yes    Patient Care Team: Moses Manners, MD as PCP - General   Assessment:     Exercise Activities and Dietary recommendations Current Exercise Habits: Home exercise routine ("[place]"), Time (Minutes): 15, Frequency (Times/Week): 5, Weekly Exercise (Minutes/Week): 75, Intensity: Moderate, Exercise limited by: respiratory conditions(s) (COPD)  Goals    . Exercise 5x per week (30 min per time)          15 minutes in AM and 15 minutes in PM (peddles 2 miles each time)    . Weight < 160 lb (72.576 kg)      Fall Risk Fall Risk  10/28/2016 04/07/2016 12/17/2015 12/04/2015 10/23/2015  Falls in the past year? No No No No No   Depression Screen PHQ 2/9 Scores 10/28/2016 04/07/2016 12/17/2015 12/04/2015  PHQ - 2 Score 0 0 0 0   TUG Test:  Done in 11 seconds. Patient walked with portable O2 in left hand.  Cognitive Function: Mini-Cog  Passed with score 3/5  Cognitive Function MMSE - Mini Mental State Exam 03/02/2011  Orientation to time 5  Orientation to Place 5  Registration 3  Attention/ Calculation 5  Recall 3  Language- name 2 objects 2  Language- repeat 1  Language- follow 3 step command 3  Language- read & follow direction 1  Write a sentence 1  Copy design 0  Total score 29        Immunization History  Administered Date(s) Administered  . Influenza Split 04/09/2011, 04/04/2012  . Influenza Whole 06/12/2007, 05/06/2008, 05/20/2009, 04/17/2010  . Influenza,inj,Quad PF,36+ Mos 04/06/2013, 04/10/2014, 04/03/2015, 04/07/2016  . Pneumococcal Conjugate-13 04/06/2013  . Pneumococcal Polysaccharide-23 06/19/2003, 10/08/2009, 04/04/2015  . Td 07/20/2003  . Tdap 09/28/2013   Screening Tests Health Maintenance  Topic Date Due  . COLONOSCOPY  09/03/2014  . INFLUENZA VACCINE  02/16/2017  . TETANUS/TDAP  09/29/2023  . Hepatitis C Screening   Completed  . PNA vac Low Risk Adult  Completed      Plan:    During the course of the visit the patient was educated and counseled about the following appropriate screening and preventive services:   Vaccines to include Pneumoccal, Influenza, Td, Zostavax/Shingrix  Cardiovascular Disease  Colorectal cancer screening  Diabetes screening  Nutrition counseling    Patient Instructions (the written plan) was given to the patient.    Fredderick Severance, RN  10/28/2016

## 2016-10-28 NOTE — Progress Notes (Signed)
Patient ID: Michael Robertson., male   DOB: Jun 01, 1946, 71 y.o.   MRN: [id number] I have reviewed this visit and discussed with Alecia Lemming, RN, BSN, and agree with her documentation.

## 2016-11-17 ENCOUNTER — Encounter: Payer: Self-pay | Admitting: Family Medicine

## 2016-11-17 ENCOUNTER — Ambulatory Visit (INDEPENDENT_AMBULATORY_CARE_PROVIDER_SITE_OTHER): Payer: Medicare Other | Admitting: Family Medicine

## 2016-11-17 DIAGNOSIS — N183 Chronic kidney disease, stage 3 unspecified: Secondary | ICD-10-CM

## 2016-11-17 DIAGNOSIS — J9611 Chronic respiratory failure with hypoxia: Secondary | ICD-10-CM

## 2016-11-17 DIAGNOSIS — I10 Essential (primary) hypertension: Secondary | ICD-10-CM

## 2016-11-17 DIAGNOSIS — J439 Emphysema, unspecified: Secondary | ICD-10-CM | POA: Diagnosis not present

## 2016-11-17 NOTE — Assessment & Plan Note (Signed)
Remarkably stable on current meds. 

## 2016-11-17 NOTE — Patient Instructions (Addendum)
You look great. No changes Get back exercises. I will call with Blood test results. See me in 6 months October for recheck and flu shot.

## 2016-11-17 NOTE — Assessment & Plan Note (Signed)
Good control on current meds. 

## 2016-11-17 NOTE — Assessment & Plan Note (Signed)
Check labs 

## 2016-11-17 NOTE — Assessment & Plan Note (Signed)
Remarkably stable on current meds.

## 2016-11-17 NOTE — Progress Notes (Signed)
   Subjective:    Patient ID: Michael Robertson., male    DOB: Aug 20, 1945, 71 y.o.   MRN: [id number]  HPI  Mr. Dillenbeck has done remarkably well with his COPD/chronic hypoxic resp failure.  Uses home O2 and is compliant with all his meds.  He also has standing prescriptions for prednisone and azithro which he knows to take for flairs.  He has stayed out of the hospital and ER since 2012.  He has been a model patient re: med compliance and avoiding infectious exposure during the cold and flu season. Hypertension, well controled on current meds. CKD due for labs.  Denies leg edema.    Review of Systems     Objective:   Physical Exam VS noted.  Wt stable (worried about pulmonary cachexia.)   Lungs prolonged exp phase and barrel chested. Cardiac RRR without m or g Ext no edema.        Assessment & Plan:

## 2016-11-18 LAB — CMP14+EGFR
ALT: 15 IU/L (ref 0–44)
AST: 18 IU/L (ref 0–40)
Albumin/Globulin Ratio: 1.8 (ref 1.2–2.2)
Albumin: 4.3 g/dL (ref 3.5–4.8)
Alkaline Phosphatase: 60 IU/L (ref 39–117)
BUN/Creatinine Ratio: 14 (ref 10–24)
BUN: 20 mg/dL (ref 8–27)
Bilirubin Total: 0.7 mg/dL (ref 0.0–1.2)
CO2: 25 mmol/L (ref 18–29)
Calcium: 9.5 mg/dL (ref 8.6–10.2)
Chloride: 99 mmol/L (ref 96–106)
Creatinine, Ser: 1.47 mg/dL — ABNORMAL HIGH (ref 0.76–1.27)
GFR calc Af Amer: 54 mL/min/{1.73_m2} — ABNORMAL LOW (ref >59)
GFR calc non Af Amer: 47 mL/min/{1.73_m2} — ABNORMAL LOW (ref >59)
Globulin, Total: 2.4 g/dL (ref 1.5–4.5)
Glucose: 88 mg/dL (ref 65–99)
Potassium: 4.2 mmol/L (ref 3.5–5.2)
Sodium: 141 mmol/L (ref 134–144)
Total Protein: 6.7 g/dL (ref 6.0–8.5)

## 2016-11-18 LAB — CBC
Hematocrit: 43.7 % (ref 37.5–51.0)
Hemoglobin: 14.6 g/dL (ref 13.0–17.7)
MCH: 32.2 pg (ref 26.6–33.0)
MCHC: 33.4 g/dL (ref 31.5–35.7)
MCV: 96 fL (ref 79–97)
Platelets: 221 10*3/uL (ref 150–379)
RBC: 4.54 x10E6/uL (ref 4.14–5.80)
RDW: 12.9 % (ref 12.3–15.4)
WBC: 7.1 10*3/uL (ref 3.4–10.8)

## 2016-11-18 LAB — LIPID PANEL
Chol/HDL Ratio: 4 ratio (ref 0.0–5.0)
Cholesterol, Total: 183 mg/dL (ref 100–199)
HDL: 46 mg/dL (ref >39)
LDL Calculated: 103 mg/dL — ABNORMAL HIGH (ref 0–99)
Triglycerides: 170 mg/dL — ABNORMAL HIGH (ref 0–149)
VLDL Cholesterol Cal: 34 mg/dL (ref 5–40)

## 2017-01-10 ENCOUNTER — Other Ambulatory Visit: Payer: Self-pay | Admitting: Family Medicine

## 2017-01-10 DIAGNOSIS — J439 Emphysema, unspecified: Secondary | ICD-10-CM

## 2017-01-20 DIAGNOSIS — H6123 Impacted cerumen, bilateral: Secondary | ICD-10-CM | POA: Diagnosis not present

## 2017-03-20 ENCOUNTER — Other Ambulatory Visit: Payer: Self-pay | Admitting: Family Medicine

## 2017-03-30 ENCOUNTER — Other Ambulatory Visit: Payer: Self-pay | Admitting: Family Medicine

## 2017-04-14 ENCOUNTER — Encounter: Payer: Self-pay | Admitting: Family Medicine

## 2017-04-14 ENCOUNTER — Ambulatory Visit (INDEPENDENT_AMBULATORY_CARE_PROVIDER_SITE_OTHER): Payer: Medicare Other | Admitting: Family Medicine

## 2017-04-14 DIAGNOSIS — L309 Dermatitis, unspecified: Secondary | ICD-10-CM | POA: Insufficient documentation

## 2017-04-14 DIAGNOSIS — Z23 Encounter for immunization: Secondary | ICD-10-CM | POA: Diagnosis not present

## 2017-04-14 DIAGNOSIS — J439 Emphysema, unspecified: Secondary | ICD-10-CM | POA: Diagnosis not present

## 2017-04-14 MED ORDER — TRIAMCINOLONE ACETONIDE 0.1 % EX OINT: 1.0000 "application " | TOPICAL_OINTMENT | Freq: Two times a day (BID) | CUTANEOUS | 3 refills | Status: DC

## 2017-04-14 NOTE — Patient Instructions (Signed)
No changes  Keep up the good work. See me in six months.

## 2017-04-15 NOTE — Assessment & Plan Note (Signed)
Doing well cont current Rx.  Flu shot today

## 2017-04-15 NOTE — Progress Notes (Signed)
   Subjective:    Patient ID: Michael Robertson., male    DOB: 1946-02-01, 71 y.o.   MRN: [id number]  HPI  Here for six month check of severe, o2 requiring COPD.  Also needs flu shot.  Michael Robertson has done quite well over the past 6 months, actually over the past several years.  He is nicely educated and we have worked well with giving him a Field seismologist for doxy and pred to be used only for flairs.  He has used only 3 times in the last six months (every onther month.)  Up to date on health maint.  No other complaints.    Review of Systems     Objective:   Physical Exam Gen wt nicely stable.  VS noted. Lungs prolonged exp phase.  No wheeze.        Assessment & Plan:

## 2017-06-01 ENCOUNTER — Other Ambulatory Visit: Payer: Self-pay | Admitting: Family Medicine

## 2017-06-01 DIAGNOSIS — J439 Emphysema, unspecified: Secondary | ICD-10-CM

## 2017-06-13 ENCOUNTER — Other Ambulatory Visit: Payer: Self-pay | Admitting: Family Medicine

## 2017-06-13 DIAGNOSIS — L309 Dermatitis, unspecified: Secondary | ICD-10-CM

## 2017-06-13 MED ORDER — TRIAMCINOLONE ACETONIDE 0.1 % EX OINT: 1.0000 "application " | TOPICAL_OINTMENT | Freq: Two times a day (BID) | CUTANEOUS | 3 refills | Status: DC

## 2017-08-08 ENCOUNTER — Other Ambulatory Visit: Payer: Self-pay | Admitting: Family Medicine

## 2017-08-08 DIAGNOSIS — J439 Emphysema, unspecified: Secondary | ICD-10-CM

## 2017-08-10 ENCOUNTER — Other Ambulatory Visit: Payer: Self-pay | Admitting: Family Medicine

## 2017-09-07 ENCOUNTER — Other Ambulatory Visit: Payer: Self-pay | Admitting: Family Medicine

## 2017-09-07 DIAGNOSIS — E7800 Pure hypercholesterolemia, unspecified: Secondary | ICD-10-CM

## 2017-09-07 DIAGNOSIS — N400 Enlarged prostate without lower urinary tract symptoms: Secondary | ICD-10-CM

## 2017-09-26 ENCOUNTER — Other Ambulatory Visit: Payer: Self-pay | Admitting: Family Medicine

## 2017-09-26 DIAGNOSIS — J439 Emphysema, unspecified: Secondary | ICD-10-CM

## 2017-10-07 ENCOUNTER — Other Ambulatory Visit: Payer: Self-pay | Admitting: Family Medicine

## 2017-10-29 LAB — HEMOGLOBIN A1C: Hemoglobin A1C: 6.6

## 2017-11-23 ENCOUNTER — Encounter: Payer: Self-pay | Admitting: Family Medicine

## 2017-11-23 ENCOUNTER — Ambulatory Visit (INDEPENDENT_AMBULATORY_CARE_PROVIDER_SITE_OTHER): Payer: Medicare Other | Admitting: Family Medicine

## 2017-11-23 ENCOUNTER — Other Ambulatory Visit: Payer: Self-pay

## 2017-11-23 VITALS — BP 112/58 | HR 93 | Temp 98.1°F | Ht 71.0 in | Wt 169.6 lb

## 2017-11-23 DIAGNOSIS — J439 Emphysema, unspecified: Secondary | ICD-10-CM | POA: Diagnosis not present

## 2017-11-23 DIAGNOSIS — K5900 Constipation, unspecified: Secondary | ICD-10-CM | POA: Diagnosis not present

## 2017-11-23 DIAGNOSIS — E119 Type 2 diabetes mellitus without complications: Secondary | ICD-10-CM | POA: Diagnosis not present

## 2017-11-23 DIAGNOSIS — E1129 Type 2 diabetes mellitus with other diabetic kidney complication: Secondary | ICD-10-CM | POA: Insufficient documentation

## 2017-11-23 NOTE — Assessment & Plan Note (Signed)
Stable. Doing well on current medications. Cough is likely related to allergies versus acute COPD exacerbation.

## 2017-11-23 NOTE — Assessment & Plan Note (Signed)
Well-controlled with diet and exercise. No pharmacological treatment needed at this time. - Diabetic foot exam completed today - Educated patient on need for annual eye exam - No urine microalbumin because patient is on lisinopril - Will recheck A1c at next visit (unless already completed by [place])

## 2017-11-23 NOTE — Progress Notes (Signed)
Date of Visit: 11/23/2017   HPI:  Mr. Michael Robertson is a 72 y.o. man here today to discuss the following issues:  Cough - Started in mid-April after visits to the [place] for spirometry and chest x-ray - Cough is sometimes productive of yellow/green mucus; other times it is non-productive - Able to walk from parking lot to check in counter at doctor's office today with mild shortness of breath, similar to baseline - Has also been sneezing more as weather has gotten warmer - Denies orthopnea, chest tightness  Constipation - In patient's own words, it feels like "the door ain't opening" - Usually has bowel movement every 2 days; over past 2 weeks, bowel movements have been every 3-4 days with heavy straining - Normal stool consistency - Started using 1 cap-full of Miralax on Saturday; unsure if it has improved constipation - Denies blood in stool, dark/tarry stools; no abdominal pain - Endorses some loss of appetite over past 2 weeks  Type 2 Diabetes - [facility] showed Hb A1c = 6.6   ROS: See HPI.  PMH: COPD, HTN, Stage 3 CKD, allergic rhinitis  PHYSICAL EXAM: BP (!) 112/58   Pulse 93   Temp 98.1 F (36.7 C) (Oral)   Ht  (1.803 m)   Wt 169 lb 9.6 oz (76.9 kg)   SpO2 91%   BMI 23.65 kg/m  Gen: Well appearing, sitting comfortably in chair Heart: RRR, no murmurs/rubs/gallops; dorsalis pedis pulses 2+ bilaterally Lungs: distant breath sounds, no crackles or rhonchi, normal work of breathing Neuro: Grossly intact, normal speech Ext: atraumatic, no cyanosis or edema  ASSESSMENT/PLAN:   Constipation No red flags necessitating further workup at this time. Weight is stable since last visit, so appetite loss is likely benign. - Recommended increasing Miralax to 2-3 cap-fulls per day until bowel movements occur more regularly, then titrating down to avoid overcorrection - Educated on role of relaxation/distraction in stool elimination - Will monitor weight for any concerning  trends  COPD (chronic obstructive pulmonary disease) with emphysema Stable. Doing well on current medications. Cough is likely related to allergies versus acute COPD exacerbation.  Well controlled type 2 diabetes mellitus (HCC) Well-controlled with diet and exercise. No pharmacological treatment needed at this time. - Diabetic foot exam completed today - Educated patient on need for annual eye exam - No urine microalbumin because patient is on lisinopril - Will recheck A1c at next visit (unless already completed by [place])  FOLLOW UP: Come back in 6 months for flu shot and follow up of above conditions.

## 2017-11-23 NOTE — Patient Instructions (Signed)
Come back in the fall (about 6 months from now) when it's time for your flu shot. We will also check your Hemoglobin A1c at that time (unless it has already been done by the [place]) to see how your diabetes is doing.  Please also have your eye doctor send [place] a copy of your eye exam results.

## 2017-11-23 NOTE — Assessment & Plan Note (Signed)
No red flags necessitating further workup at this time. Weight is stable since last visit, so appetite loss is likely benign. - Recommended increasing Miralax to 2-3 cap-fulls per day until bowel movements occur more regularly, then titrating down to avoid overcorrection - Educated on role of relaxation/distraction in stool elimination - Will monitor weight for any concerning trends

## 2017-11-24 ENCOUNTER — Encounter: Payer: Self-pay | Admitting: Family Medicine

## 2017-12-06 ENCOUNTER — Encounter: Payer: Self-pay | Admitting: Family Medicine

## 2018-02-18 ENCOUNTER — Other Ambulatory Visit: Payer: Self-pay | Admitting: Family Medicine

## 2018-02-18 DIAGNOSIS — J439 Emphysema, unspecified: Secondary | ICD-10-CM

## 2018-03-10 ENCOUNTER — Telehealth (HOSPITAL_COMMUNITY): Payer: Self-pay

## 2018-03-10 NOTE — Telephone Encounter (Signed)
Called and spoke with patient in regards to Pulmonary Rehab - Patient stated he is interested in the program. He is going out of town 04/14/18 and would like to begin after. Scheduled orientation on 05/05/18 at 9:30am. Patient will attend the 10:30am exc class. Mailed packet.

## 2018-03-22 ENCOUNTER — Telehealth (HOSPITAL_COMMUNITY): Payer: Self-pay

## 2018-03-22 NOTE — Telephone Encounter (Signed)
Patient called to reschedule is [place] orientation. He will come in 04/03/18 @ 9:30AM.

## 2018-03-27 ENCOUNTER — Telehealth: Payer: Self-pay

## 2018-03-27 NOTE — Telephone Encounter (Signed)
Noted and agree. 

## 2018-03-27 NOTE — Telephone Encounter (Signed)
Pt called nurse line, states he checked his blood sugar this morning, and it was 412. States he feels totally fine, and he doesn't normally check his blood sugar but his wife was checking hers, and he decided to see what his was. Pt had eaten a sausage biscuit and had coffee prior to checking his sugar. Does have h/o diabetes but takes no medication for it. Advised since he is asymptomatic he didn't need to come in this morning, but he does have an appt with his PCP next week, and to discuss with him at that time. Pt to call back if begins to have any sx or other questions or concerns. Will forward to PCP for review. Shawna Orleans, RN

## 2018-04-03 ENCOUNTER — Encounter (HOSPITAL_COMMUNITY): Payer: Self-pay

## 2018-04-03 ENCOUNTER — Encounter (HOSPITAL_COMMUNITY)
Admission: RE | Admit: 2018-04-03 | Discharge: 2018-04-03 | Disposition: A | Discharge status: Home / Self Care | Payer: No Typology Code available for payment source | Source: Ambulatory Visit | Attending: Pulmonary Disease | Admitting: Pulmonary Disease

## 2018-04-03 VITALS — BP 139/68 | HR 72 | Temp 97.7°F | Resp 16 | Ht 70.5 in | Wt 168.7 lb

## 2018-04-03 DIAGNOSIS — J439 Emphysema, unspecified: Secondary | ICD-10-CM | POA: Diagnosis present

## 2018-04-03 DIAGNOSIS — N189 Chronic kidney disease, unspecified: Secondary | ICD-10-CM | POA: Insufficient documentation

## 2018-04-03 DIAGNOSIS — Z87891 Personal history of nicotine dependence: Secondary | ICD-10-CM | POA: Insufficient documentation

## 2018-04-03 DIAGNOSIS — J449 Chronic obstructive pulmonary disease, unspecified: Secondary | ICD-10-CM | POA: Insufficient documentation

## 2018-04-03 DIAGNOSIS — E785 Hyperlipidemia, unspecified: Secondary | ICD-10-CM | POA: Insufficient documentation

## 2018-04-03 DIAGNOSIS — I129 Hypertensive chronic kidney disease with stage 1 through stage 4 chronic kidney disease, or unspecified chronic kidney disease: Secondary | ICD-10-CM | POA: Insufficient documentation

## 2018-04-03 DIAGNOSIS — Z79899 Other long term (current) drug therapy: Secondary | ICD-10-CM | POA: Insufficient documentation

## 2018-04-03 DIAGNOSIS — J438 Other emphysema: Secondary | ICD-10-CM

## 2018-04-03 NOTE — Progress Notes (Signed)
Lala LundHenry W Cashaw Jr. 72 y.o. male  DOB: Oct 09, 1945 MRN: [id number]           Nutrition Note No diagnosis found. Past Medical History:  Diagnosis Date  . Chronic kidney disease   . COPD (chronic obstructive pulmonary disease) (HCC)   . GERD (gastroesophageal reflux disease)   . Hyperlipidemia   . Hypertension   . Insomnia    Meds reviewed.   Current Outpatient Medications (Endocrine & Metabolic):  .  predniSONE (DELTASONE) 20 MG tablet, TAKE ONE TABLET BY MOUTH THREE TIMES DAILY (Patient not taking: Reported on 04/03/2018)  Current Outpatient Medications (Cardiovascular):  .  bisoprolol (ZEBETA) 5 MG tablet, TAKE 1 TABLET DAILY .  hydrochlorothiazide (HYDRODIURIL) 25 MG tablet, TAKE 1 TABLET DAILY .  lisinopril (PRINIVIL,ZESTRIL) 10 MG tablet, TAKE 1 TABLET DAILY .  pravastatin (PRAVACHOL) 40 MG tablet, TAKE 1 TABLET EVERY EVENING .  VIAGRA 100 MG tablet, TAKE 1 TABLET AS NEEDED FOR ERECTILE DYSFUNCTION  Current Outpatient Medications (Respiratory):  .  fluticasone (FLONASE) 50 MCG/ACT nasal spray, USE 2 SPRAYS NASALLY DAILY .  VENTOLIN HFA 108 (90 Base) MCG/ACT inhaler, USE 2 INHALATIONS EVERY 4 HOURS AS NEEDED. RESCUE FOR SHORTNESS OF BREATH .  VIRTUSSIN A/C 100-10 MG/5ML syrup, TAKE 5ML BY MOUTH TWICE DAILY AS NEEDED FOR COUGH    Current Outpatient Medications (Other):  .  azithromycin (ZITHROMAX) 500 MG tablet, TAKE ONE TABLET BY MOUTH ONCE DAILY WHEN  BREATHING  WORSENS (Patient taking differently: 3 (three) times a week. Mondays, Wednesdays and Fridays) .  cholecalciferol (VITAMIN D) 1000 units tablet, Take 1,000 Units by mouth daily. .  Multiple Vitamins-Minerals (CENTRUM SILVER ADULT 50+ PO), Take by mouth. Marland Kitchen.  omeprazole (PRILOSEC) 40 MG capsule, TAKE 1 CAPSULE DAILY .  tamsulosin (FLOMAX) 0.4 MG CAPS capsule, TAKE 1 CAPSULE DAILY FOR PROSTATE .  traZODone (DESYREL) 100 MG tablet, TAKE 1 TABLET AT BEDTIME .  triamcinolone ointment (KENALOG) 0.1 %, Apply 1 application  topically 2 (two) times daily. (Patient not taking: Reported on 04/03/2018)   Ht: Ht Readings from Last 1 Encounters:  04/03/18 5' 10.5" (1.791 m)     Wt:  Wt Readings from Last 3 Encounters:  04/03/18 168 lb 10.4 oz (76.5 kg)  11/23/17 169 lb 9.6 oz (76.9 kg)  04/14/17 167 lb 6.4 oz (75.9 kg)     BMI: There is no height or weight on file to calculate BMI.    Current tobacco use? No    Labs:  Lipid Panel     Component Value Date/Time   CHOL 183 11/17/2016 1006   TRIG 170 (H) 11/17/2016 1006   HDL 46 11/17/2016 1006   CHOLHDL 4.0 11/17/2016 1006   CHOLHDL 4.5 10/23/2015 1059   VLDL 40 (H) 10/23/2015 1059   LDLCALC 103 (H) 11/17/2016 1006   LDLDIRECT 94 10/20/2012 1108    Lab Results  Component Value Date   HGBA1C 6.6 10/29/2017    Nutrition Diagnosis ? Excessive sodium intake related to over consumption of processed food as evidenced by frequent consumption of convenience food/ canned vegetables and eating out frequently.   Goal(s)  1. Pt to identify and limit food sources of sodium. 2. The pt will consume high-energy, high-nutrient dense beverages when necessary to compensate for decreased oral intake of solid foods. 3. The pt will have family and friends shop for food when necessary so that nourishing foods are always available at home. 4. Describe the benefit of including fruits, vegetables, whole grains, and low-fat dairy  products in a healthy meal plan.  Plan:  Pt to attend Pulmonary Nutrition class Will provide client-centered nutrition education as part of interdisciplinary care.    Monitor and Evaluate progress toward nutrition goal with team.   Ross Marcus, MS, RD, LDN 04/03/2018 2:44 PM

## 2018-04-03 NOTE — Progress Notes (Signed)
Michael LundHenry W Cannata Jr. 72 y.o. male  Michael Robertson referred to pulmonary rehab by Dr[facility] Melyn[facility]lance([facility]) for [facility], arrived today in [facility] for orientation to Pulmonary Rehab. [facility]. He does carry portable oxygen. Per pt, he uses oxygen continuously. Pt uses [facility] as he O2 provider. Color good, skin warm and dry. Patient is oriented to time and place. Patient's medical history, psychosocial health, and medications reviewed. Psychosocial assessment reveals pt lives with their spouse. Pt is currently retired. Pt hobbies include putting jigsaw puzzles together, making model cars. Pt reports his stress level is not an issue.  Pt is happily newly married. Pt exhibits signs of depression. Signs of depression include helplessness on occasion typically with the season. Pt is bothered by his shortness of breath but does admit [facility]. Pt has a harder time during the winter than in summer and spring. Pt reports early morning awakenings and the inability to get back to sleep.Pt [place] in [place] and he had a stroke.  Pt son is in his 2240's and this was a shock to the family.  Pt and wife are going next week to see his son.  Pt doesn't know the full extent of pt CVA and whether there are any deficits he may have. PHQ2/9 score 1/8 Pt shows good  coping skills with positive outlook .  Will continue to monitor and evaluate progress toward psychosocial goal(s) of continued well being. Physical assessment reveals heart rate is normal, breath sounds diminished throughout. Grip strength equal, strong. Distal pulses palpable with no swelling. Patient reports he does take medications as prescribed. Patient states he follows a Regular diet.  Pt reports no specific efforts to gain/lose weight. Pt would like to lose about 10 pounds.  There is a concern about the possibility pt may be diabetic. Pt wife checked his  blood sugar on her meter and it was 400.  Pt is to see his PCP - Dr. Leveda AnnaHensel on Wednesday of this week.Patient's weight will be monitored closely. Demonstration and practice of PLB using pulse oximeter. Patient able to return demonstration satisfactorily. Safety and hand hygiene in the exercise area reviewed with patient. Patient voices understanding of the information reviewed. Department expectations discussed with patient and achievable goals were set. The patient shows enthusiasm about attending the program and we look forward to working with this nice veteran. The patient is scheduled for a 6 min walk test on 9/17 and to begin exercise on 10/1 at 1030 when he returns from his trip to [place]. 45 minutes was spent on a variety of activities such as assessment of the patient, obtaining baseline data including height, weight, BMI, and grip strength, verifying medical history, allergies, and current medications, and teaching patient strategies for performing tasks with less respiratory effort with emphasis on pursed lip breathing. [phone] Karlene Linemanarlette Carlton RN, BSN Cardiac and Pulmonary Rehab Nurse Navigator

## 2018-04-04 ENCOUNTER — Encounter (HOSPITAL_COMMUNITY)
Admission: RE | Admit: 2018-04-04 | Discharge: 2018-04-04 | Disposition: A | Discharge status: Home / Self Care | Payer: No Typology Code available for payment source | Source: Ambulatory Visit | Attending: Pulmonary Disease | Admitting: Pulmonary Disease

## 2018-04-04 DIAGNOSIS — J439 Emphysema, unspecified: Secondary | ICD-10-CM | POA: Diagnosis not present

## 2018-04-04 DIAGNOSIS — J438 Other emphysema: Secondary | ICD-10-CM

## 2018-04-04 NOTE — Progress Notes (Signed)
Pulmonary Individual Treatment Plan  Patient Details  Name: Michael Robertson. MRN: [id number] Date of Birth: [birth year] Referring Provider:     Pulmonary Rehab Walk Test from 04/04/2018 in [facility] CARDIAC [facility]  Referring Provider  Dr. Molli Knock      Initial Encounter Date:    Pulmonary Rehab Walk Test from 04/04/2018 in [facility] CARDIAC REHAB  Date  04/04/18      Visit Diagnosis: Other emphysema (HCC)  Patient's Home Medications on Admission:   Current Outpatient Medications:  .  azithromycin (ZITHROMAX) 500 MG tablet, TAKE ONE TABLET BY MOUTH ONCE DAILY WHEN  BREATHING  WORSENS (Patient taking differently: 3 (three) times a week. Mondays, Wednesdays and Fridays), Disp: 5 tablet, Rfl: 6 .  bisoprolol (ZEBETA) 5 MG tablet, TAKE 1 TABLET DAILY, Disp: 90 tablet, Rfl: 3 .  cholecalciferol (VITAMIN D) 1000 units tablet, Take 1,000 Units by mouth daily., Disp: , Rfl:  .  fluticasone (FLONASE) 50 MCG/ACT nasal spray, USE 2 SPRAYS NASALLY DAILY, Disp: 32 g, Rfl: 2 .  hydrochlorothiazide (HYDRODIURIL) 25 MG tablet, TAKE 1 TABLET DAILY, Disp: 90 tablet, Rfl: 3 .  lisinopril (PRINIVIL,ZESTRIL) 10 MG tablet, TAKE 1 TABLET DAILY, Disp: 90 tablet, Rfl: 3 .  Multiple Vitamins-Minerals (CENTRUM SILVER ADULT 50+ PO), Take by mouth., Disp: , Rfl:  .  omeprazole (PRILOSEC) 40 MG capsule, TAKE 1 CAPSULE DAILY, Disp: 90 capsule, Rfl: 3 .  pravastatin (PRAVACHOL) 40 MG tablet, TAKE 1 TABLET EVERY EVENING, Disp: 90 tablet, Rfl: 3 .  predniSONE (DELTASONE) 20 MG tablet, TAKE ONE TABLET BY MOUTH THREE TIMES DAILY (Patient not taking: Reported on 04/03/2018), Disp: 15 tablet, Rfl: 12 .  tamsulosin (FLOMAX) 0.4 MG CAPS capsule, TAKE 1 CAPSULE DAILY FOR PROSTATE, Disp: 90 capsule, Rfl: 3 .  traZODone (DESYREL) 100 MG tablet, TAKE 1 TABLET AT BEDTIME, Disp: 90 tablet, Rfl: 3 .  triamcinolone ointment (KENALOG) 0.1 %, Apply 1 application topically 2 (two) times daily. (Patient  not taking: Reported on 04/03/2018), Disp: 453.6 g, Rfl: 3 .  VENTOLIN HFA 108 (90 Base) MCG/ACT inhaler, USE 2 INHALATIONS EVERY 4 HOURS AS NEEDED. RESCUE FOR SHORTNESS OF BREATH, Disp: 54 g, Rfl: 1 .  VIAGRA 100 MG tablet, TAKE 1 TABLET AS NEEDED FOR ERECTILE DYSFUNCTION, Disp: 18 tablet, Rfl: 2 .  VIRTUSSIN A/C 100-10 MG/5ML syrup, TAKE BY MOUTH TWICE DAILY AS NEEDED FOR COUGH, Disp: 120 mL, Rfl: 0  Past Medical History: Past Medical History:  Diagnosis Date  . Chronic kidney disease   . COPD (chronic obstructive pulmonary disease) (HCC)   . GERD (gastroesophageal reflux disease)   . Hyperlipidemia   . Hypertension   . Insomnia     Tobacco Use: Social History   Tobacco Use  Smoking Status Former Smoker  . Packs/day: 1.75  . Years: 40.00  . Pack years: 70.00  . Types: Cigarettes  . Last attempt to quit: 03/01/2004  . Years since quitting: 14.1  Smokeless Tobacco Never Used    Labs: Recent Hydrographic surveyor    Labs for ITP Cardiac and Pulmonary Rehab Latest Ref Rng & Units 04/06/2013 10/04/2014 10/23/2015 11/17/2016 10/29/2017   Cholestrol 100 - 199 mg/dL [phone] 782 -   LDLCALC 0 - 99 mg/dL 78 83 79 956(O) -   LDLDIRECT mg/dL - - - - -   HDL >13 mg/dL 08(M) 42 57(Q) 46 -   Trlycerides 0 - 149 mg/dL 469 629(B) 284(X) 324(M) -  Hemoglobin A1c - - - - - 6.6   PHART 7.350 - 7.450 - - - - -   PCO2ART 35.0 - 45.0 mmHg - - - - -   HCO3 20.0 - 24.0 mEq/L - - - - -   TCO2 0 - 100 mmol/L - - - - -   O2SAT % - - - - -      Capillary Blood Glucose: No results found for: GLUCAP   Pulmonary Assessment Scores: Pulmonary Assessment Scores    Row Name 04/04/18 1641         ADL UCSD   ADL Phase  Entry       mMRC Score   mMRC Score  3        Pulmonary Function Assessment:   Exercise Target Goals: Exercise Program Goal: Individual exercise prescription set using results from initial 6 min walk test and THRR while considering  patient's activity barriers and  safety.   Exercise Prescription Goal: Initial exercise prescription builds to 30-45 minutes a day of aerobic activity, 2-3 days per week.  Home exercise guidelines will be given to patient during program as part of exercise prescription that the participant will acknowledge.  Activity Barriers & Risk Stratification: Activity Barriers & Cardiac Risk Stratification - 04/03/18 1159      Activity Barriers & Cardiac Risk Stratification   Activity Barriers  History of Falls;Shortness of Breath       6 Minute Walk: 6 Minute Walk    Row Name 04/04/18 1641         6 Minute Walk   Phase  Initial     Distance  800 feet     Walk Time  6 minutes     # of Rest Breaks  0     MPH  1.5     METS  2.15     RPE  13     Perceived Dyspnea   1     Symptoms  Yes (comment)     Comments  used wheelchair     Resting HR  81 bpm     Resting BP  128/66     Resting Oxygen Saturation   94 %     Exercise Oxygen Saturation  during 6 min walk  86 %     Max Ex. HR  121 bpm     Max Ex. BP  148/80       Interval HR   1 Minute HR  84     2 Minute HR  88     3 Minute HR  88     4 Minute HR  110     5 Minute HR  121     6 Minute HR  121     2 Minute Post HR  113     Interval Heart Rate?  Yes       Interval Oxygen   Interval Oxygen?  Yes     Baseline Oxygen Saturation %  94 %     1 Minute Oxygen Saturation %  94 %     1 Minute Liters of Oxygen  2 L     2 Minute Oxygen Saturation %  91 %     2 Minute Liters of Oxygen  2 L     3 Minute Oxygen Saturation %  91 %     3 Minute Liters of Oxygen  2 L     4 Minute Oxygen Saturation %  86 %  4 Minute Liters of Oxygen  2 L     5 Minute Oxygen Saturation %  86 %     5 Minute Liters of Oxygen  4 L     6 Minute Oxygen Saturation %  86 %     6 Minute Liters of Oxygen  4 L     2 Minute Post Oxygen Saturation %  90 %     2 Minute Post Liters of Oxygen  4 L        Oxygen Initial Assessment: Oxygen Initial Assessment - 04/04/18 1640      Home Oxygen    Home Oxygen Device  Portable Concentrator    Sleep Oxygen Prescription  Continuous    Liters per minute  2    Home Exercise Oxygen Prescription  Continuous    Liters per minute  2    Home at Rest Exercise Oxygen Prescription  Continuous    Liters per minute  2    Compliance with Home Oxygen Use  Yes      Initial 6 min Walk   Oxygen Used  Continuous;E-Tanks    Liters per minute  4      Program Oxygen Prescription   Program Oxygen Prescription  Continuous;E-Tanks    Liters per minute  4      Intervention   Short Term Goals  To learn and exhibit compliance with exercise, home and travel O2 prescription;To learn and understand importance of maintaining oxygen saturations>88%;To learn and demonstrate proper use of respiratory medications;To learn and understand importance of monitoring SPO2 with pulse oximeter and demonstrate accurate use of the pulse oximeter.;To learn and demonstrate proper pursed lip breathing techniques or other breathing techniques.    Long  Term Goals  Exhibits compliance with exercise, home and travel O2 prescription;Verbalizes importance of monitoring SPO2 with pulse oximeter and return demonstration;Maintenance of O2 saturations>88%;Exhibits proper breathing techniques, such as pursed lip breathing or other method taught during program session;Compliance with respiratory medication;Demonstrates proper use of MDI's       Oxygen Re-Evaluation:   Oxygen Discharge (Final Oxygen Re-Evaluation):   Initial Exercise Prescription: Initial Exercise Prescription - 04/04/18 1600      Date of Initial Exercise RX and Referring Provider   Date  04/04/18    Referring Provider  Dr. Molli Knock      Oxygen   Oxygen  Continuous    Liters  4      Recumbant Bike   Level  2    Watts  10    Minutes  17      NuStep   Level  2    SPM  80    Minutes  17    METs  1.5      Track   Laps  5    Minutes  17      Prescription Details   Frequency (times per week)  2     Duration  Progress to 45 minutes of aerobic exercise without signs/symptoms of physical distress      Intensity   THRR 40-80% of Max Heartrate  59-118    Ratings of Perceived Exertion  11-13    Perceived Dyspnea  0-4      Progression   Progression  Continue to progress workloads to maintain intensity without signs/symptoms of physical distress.      Resistance Training   Training Prescription  Yes    Weight  ORANGE BANDS    Reps  10-15  Perform Capillary Blood Glucose checks as needed.  Exercise Prescription Changes:   Exercise Comments:   Exercise Goals and Review: Exercise Goals    Row Name 04/03/18 1041             Exercise Goals   Increase Physical Activity  Yes       Intervention  Provide advice, education, support and counseling about physical activity/exercise needs.;Develop an individualized exercise prescription for aerobic and resistive training based on initial evaluation findings, risk stratification, comorbidities and participant's personal goals.       Expected Outcomes  Short Term: Attend rehab on a regular basis to increase amount of physical activity.;Long Term: Exercising regularly at least 3-5 days a week.;Long Term: Add in home exercise to make exercise part of routine and to increase amount of physical activity.       Increase Strength and Stamina  Yes       Intervention  Provide advice, education, support and counseling about physical activity/exercise needs.;Develop an individualized exercise prescription for aerobic and resistive training based on initial evaluation findings, risk stratification, comorbidities and participant's personal goals.       Expected Outcomes  Short Term: Increase workloads from initial exercise prescription for resistance, speed, and METs.;Short Term: Perform resistance training exercises routinely during rehab and add in resistance training at home;Long Term: Improve cardiorespiratory fitness, muscular endurance and  strength as measured by increased METs and functional capacity ( )       Able to understand and use rate of perceived exertion (RPE) scale  Yes       Intervention  Provide education and explanation on how to use RPE scale       Expected Outcomes  Short Term: Able to use RPE daily in rehab to express subjective intensity level;Long Term:  Able to use RPE to guide intensity level when exercising independently       Able to understand and use Dyspnea scale  Yes       Intervention  Provide education and explanation on how to use Dyspnea scale       Expected Outcomes  Short Term: Able to use Dyspnea scale daily in rehab to express subjective sense of shortness of breath during exertion;Long Term: Able to use Dyspnea scale to guide intensity level when exercising independently       Knowledge and understanding of Target Heart Rate Range (THRR)  Yes       Intervention  Provide education and explanation of THRR including how the numbers were predicted and where they are located for reference       Expected Outcomes  Short Term: Able to state/look up THRR;Long Term: Able to use THRR to govern intensity when exercising independently;Short Term: Able to use daily as guideline for intensity in rehab       Understanding of Exercise Prescription  Yes       Intervention  Provide education, explanation, and written materials on patient's individual exercise prescription       Expected Outcomes  Short Term: Able to explain program exercise prescription;Long Term: Able to explain home exercise prescription to exercise independently          Exercise Goals Re-Evaluation :   Discharge Exercise Prescription (Final Exercise Prescription Changes):   Nutrition:  Target Goals: Understanding of nutrition guidelines, daily intake of sodium 1500mg , cholesterol 200mg , calories 30% from fat and 7% or less from saturated fats, daily to have 5 or more servings of fruits and vegetables.  Biometrics:  Nutrition  Therapy Plan and Nutrition Goals: Nutrition Therapy & Goals - 04/03/18 1445      Nutrition Therapy   Diet  general healthful, low sodium      Personal Nutrition Goals   Nutrition Goal  Pt to identify and limit food sources of sodium.      Intervention Plan   Intervention  Prescribe, educate and counsel regarding individualized specific dietary modifications aiming towards targeted core components such as weight, hypertension, lipid management, diabetes, heart failure and other comorbidities.    Expected Outcomes  Short Term Goal: Understand basic principles of dietary content, such as calories, fat, sodium, cholesterol and nutrients.;Long Term Goal: Adherence to prescribed nutrition plan.       Nutrition Assessments: Nutrition Assessments - 04/03/18 1447      Rate Your Plate Scores   Pre Score  47       Nutrition Goals Re-Evaluation:   Nutrition Goals Discharge (Final Nutrition Goals Re-Evaluation):   Psychosocial: Target Goals: Acknowledge presence or absence of significant depression and/or stress, maximize coping skills, provide positive support system. Participant is able to verbalize types and ability to use techniques and skills needed for reducing stress and depression.  Initial Review & Psychosocial Screening: Initial Psych Review & Screening - 04/03/18 1046      Initial Review   Current issues with  Current Anxiety/Panic      Family Dynamics   Good Support System?  Yes    Comments  anxious about getting pneumonia so he stays inside for the winter months - grey days he feels more depressed.  happily married for 6 years      Barriers   Psychosocial barriers to participate in program  The patient should benefit from training in stress management and relaxation.      Screening Interventions   Interventions  Encouraged to exercise    Expected Outcomes  Long Term goal: The participant improves quality of Life and PHQ9 Scores as seen by post scores and/or  verbalization of changes;Short Term goal: Identification and review with participant of any Quality of Life or Depression concerns found by scoring the questionnaire.       Quality of Life Scores:  Scores of 19 and below usually indicate a poorer quality of life in these areas.  A difference of  2-3 points is a clinically meaningful difference.  A difference of 2-3 points in the total score of the Quality of Life Index has been associated with significant improvement in overall quality of life, self-image, physical symptoms, and general health in studies assessing change in quality of life.  PHQ-9: Recent Review Flowsheet Data    Depression screen [facility] 2/9 04/03/2018 11/23/2017 11/23/2017 04/14/2017 11/17/2016   Decreased Interest 0 0 0 0 0   Down, Depressed, Hopeless 1 0 0 0 0   PHQ - 2 Score 1 0 0 0 0   Altered sleeping 3 - - - -   Tired, decreased energy 3  - - - -   Change in appetite 0 - - - -   Feeling bad or failure about yourself  1 - - - -   Trouble concentrating 0 - - - -   Moving slowly or fidgety/restless 0 - - - -   Suicidal thoughts 0 - - - -   PHQ-9 Score 8 - - - -   Difficult doing work/chores Not difficult at all - - - -     Interpretation of Total Score  Total Score Depression Severity:  1-4 = Minimal depression, 5-9 = Mild depression, 10-14 = Moderate depression, 15-19 = Moderately severe depression, 20-27 = Severe depression   Psychosocial Evaluation and Intervention: Psychosocial Evaluation - 04/03/18 1049      Psychosocial Evaluation & Interventions   Interventions  Stress management education;Relaxation education;Encouraged to exercise with the program and follow exercise prescription    Comments  No immediate barriers due to the season    Expected Outcomes  Pt will feel confident in the weather months and report less fear/anxiety regarding the possibility of getting cold    Continue Psychosocial Services   Follow up required by staff       Psychosocial  Re-Evaluation:   Psychosocial Discharge (Final Psychosocial Re-Evaluation):   Education: Education Goals: Education classes will be provided on a weekly basis, covering required topics. Participant will state understanding/return demonstration of topics presented.  Learning Barriers/Preferences: Learning Barriers/Preferences - 04/03/18 1052      Learning Barriers/Preferences   Learning Barriers  Sight;Hearing    Learning Preferences  Skilled Demonstration;Group Instruction;Individual Instruction;Audio;Verbal Instruction       Education Topics: Risk Factor Reduction:  -Group instruction that is supported by a PowerPoint presentation. Instructor discusses the definition of a risk factor, different risk factors for pulmonary disease, and how the heart and lungs work together.     Nutrition for Pulmonary Patient:  -Group instruction provided by PowerPoint slides, verbal discussion, and written materials to support subject matter. The instructor gives an explanation and review of healthy diet recommendations, which includes a discussion on weight management, recommendations for fruit and vegetable consumption, as well as protein, fluid, caffeine, fiber, sodium, sugar, and alcohol. Tips for eating when patients are short of breath are discussed.   Pursed Lip Breathing:  -Group instruction that is supported by demonstration and informational handouts. Instructor discusses the benefits of pursed lip and diaphragmatic breathing and detailed demonstration on how to preform both.     Oxygen Safety:  -Group instruction provided by PowerPoint, verbal discussion, and written material to support subject matter. There is an overview of "What is Oxygen" and "Why do we need it".  Instructor also reviews how to create a safe environment for oxygen use, the importance of using oxygen as prescribed, and the risks of noncompliance. There is a brief discussion on traveling with oxygen and resources the  patient may utilize.   Oxygen Equipment:  -Group instruction provided by [facility] Staff utilizing handouts, written materials, and equipment demonstrations.   Signs and Symptoms:  -Group instruction provided by written material and verbal discussion to support subject matter. Warning signs and symptoms of infection, stroke, and heart attack are reviewed and when to call the physician/911 reinforced. Tips for preventing the spread of infection discussed.   Advanced Directives:  -Group instruction provided by verbal instruction and written material to support subject matter. Instructor reviews Advanced Directive laws and proper instruction for filling out document.   Pulmonary Video:  -Group video education that reviews the importance of medication and oxygen compliance, exercise, good nutrition, pulmonary hygiene, and pursed lip and diaphragmatic breathing for the pulmonary patient.   Exercise for the Pulmonary Patient:  -Group instruction that is supported by a PowerPoint presentation. Instructor discusses benefits of exercise, core components of exercise, frequency, duration, and intensity of an exercise routine, importance of utilizing pulse oximetry during exercise, safety while exercising, and options of places to exercise outside of rehab.     Pulmonary Medications:  -Verbally interactive group education provided by instructor with focus on  inhaled medications and proper administration.   Anatomy and Physiology of the Respiratory System and Intimacy:  -Group instruction provided by PowerPoint, verbal discussion, and written material to support subject matter. Instructor reviews respiratory cycle and anatomical components of the respiratory system and their functions. Instructor also reviews differences in obstructive and restrictive respiratory diseases with examples of each. Intimacy, Sex, and Sexuality differences are reviewed with a discussion on how relationships can change  when diagnosed with pulmonary disease. Common sexual concerns are reviewed.   MD DAY -A group question and answer session with a medical doctor that allows participants to ask questions that relate to their pulmonary disease state.   OTHER EDUCATION -Group or individual verbal, written, or video instructions that support the educational goals of the pulmonary rehab program.   Holiday Eating Survival Tips:  -Group instruction provided by PowerPoint slides, verbal discussion, and written materials to support subject matter. The instructor gives patients tips, tricks, and techniques to help them not only survive but enjoy the holidays despite the onslaught of food that accompanies the holidays.   Knowledge Questionnaire Score:   Core Components/Risk Factors/Patient Goals at Admission: Personal Goals and Risk Factors at Admission - 04/03/18 1053      Core Components/Risk Factors/Patient Goals on Admission    Weight Management  Weight Loss;Yes    Intervention  Weight Management/Obesity: Establish reasonable short term and long term weight goals.;Weight Management: Provide education and appropriate resources to help participant work on and attain dietary goals.;Obesity: Provide education and appropriate resources to help participant work on and attain dietary goals.;Weight Management: Develop a combined nutrition and exercise program designed to reach desired caloric intake, while maintaining appropriate intake of nutrient and fiber, sodium and fats, and appropriate energy expenditure required for the weight goal.    Admit Weight  168 lb 10.4 oz (76.5 kg)    Goal Weight: Short Term  163 lb (73.9 kg)    Goal Weight: Long Term  158 lb (71.7 kg)    Expected Outcomes  Understanding of distribution of calorie intake throughout the day with the consumption of 4-5 meals/snacks;Understanding recommendations for meals to include 15-35% energy as protein, 25-35% energy from fat, 35-60% energy from  carbohydrates, less than 200mg  of dietary cholesterol, 20-35 gm of total fiber daily;Weight Loss: Understanding of general recommendations for a balanced deficit meal plan, which promotes 1-2 lb weight loss per week and includes a negative energy balance of [phone] kcal/d;Short Term: Continue to assess and modify interventions until short term weight is achieved;Long Term: Adherence to nutrition and physical activity/exercise program aimed toward attainment of established weight goal    Improve shortness of breath with ADL's  Yes    Intervention  Provide education, individualized exercise plan and daily activity instruction to help decrease symptoms of SOB with activities of daily living.    Expected Outcomes  Short Term: Improve cardiorespiratory fitness to achieve a reduction of symptoms when performing ADLs;Long Term: Be able to perform more ADLs without symptoms or delay the onset of symptoms    Hypertension  Yes    Intervention  Provide education on lifestyle modifcations including regular physical activity/exercise, weight management, moderate sodium restriction and increased consumption of fresh fruit, vegetables, and low fat dairy, alcohol moderation, and smoking cessation.;Monitor prescription use compliance.    Expected Outcomes  Short Term: Continued assessment and intervention until BP is < 140/85mm HG in hypertensive participants. < 130/68mm HG in hypertensive participants with diabetes, heart failure or chronic kidney disease.;Long Term: Maintenance of  blood pressure at goal levels.    Lipids  Yes    Intervention  Provide education and support for participant on nutrition & aerobic/resistive exercise along with prescribed medications to achieve LDL 70mg , HDL >40mg .    Expected Outcomes  Short Term: Participant states understanding of desired cholesterol values and is compliant with medications prescribed. Participant is following exercise prescription and nutrition guidelines.;Long Term:  Cholesterol controlled with medications as prescribed, with individualized exercise RX and with personalized nutrition plan. Value goals: LDL < 70mg , HDL > 40 mg.       Core Components/Risk Factors/Patient Goals Review:    Core Components/Risk Factors/Patient Goals at Discharge (Final Review):    ITP Comments: ITP Comments    Row Name 04/03/18 1013           ITP Comments  Dr. Charlestine Massed, Medical Director, Pulmonary Rehab          Comments:

## 2018-04-05 ENCOUNTER — Encounter: Payer: Self-pay | Admitting: Family Medicine

## 2018-04-05 ENCOUNTER — Ambulatory Visit (INDEPENDENT_AMBULATORY_CARE_PROVIDER_SITE_OTHER): Payer: Medicare Other | Admitting: Family Medicine

## 2018-04-05 ENCOUNTER — Other Ambulatory Visit: Payer: Self-pay

## 2018-04-05 VITALS — BP 122/68 | HR 70 | Temp 98.0°F | Ht 71.0 in | Wt 168.6 lb

## 2018-04-05 DIAGNOSIS — E119 Type 2 diabetes mellitus without complications: Secondary | ICD-10-CM

## 2018-04-05 DIAGNOSIS — J9611 Chronic respiratory failure with hypoxia: Secondary | ICD-10-CM | POA: Diagnosis not present

## 2018-04-05 DIAGNOSIS — I10 Essential (primary) hypertension: Secondary | ICD-10-CM

## 2018-04-05 DIAGNOSIS — Z23 Encounter for immunization: Secondary | ICD-10-CM | POA: Diagnosis not present

## 2018-04-05 DIAGNOSIS — N529 Male erectile dysfunction, unspecified: Secondary | ICD-10-CM

## 2018-04-05 DIAGNOSIS — J439 Emphysema, unspecified: Secondary | ICD-10-CM

## 2018-04-05 LAB — POCT GLYCOSYLATED HEMOGLOBIN (HGB A1C): HbA1c, POC (controlled diabetic range): 5.8 % (ref 0.0–7.0)

## 2018-04-05 MED ORDER — SILDENAFIL CITRATE 100 MG PO TABS: ORAL_TABLET | ORAL | 2 refills | Status: DC

## 2018-04-05 MED ORDER — GUAIFENESIN-CODEINE 100-10 MG/5ML PO SYRP: ORAL_SOLUTION | ORAL | 2 refills | Status: DC

## 2018-04-05 NOTE — Assessment & Plan Note (Signed)
Good control on new [facility] doc regimen.

## 2018-04-05 NOTE — Assessment & Plan Note (Signed)
Well controled on current meds 

## 2018-04-05 NOTE — Patient Instructions (Signed)
You will get a flu shot today. Your diabetes control is great without meds. Please have the eye doctor send me the report about your diabetic eye exam Please call me with the name of the new mist so that I can put in your record.

## 2018-04-05 NOTE — Assessment & Plan Note (Signed)
Doing very well on current meds as tweaked by the [facility].

## 2018-04-05 NOTE — Assessment & Plan Note (Signed)
Still good control

## 2018-04-05 NOTE — Progress Notes (Signed)
   Subjective:    Patient ID: Michael LundHenry W Reilly Jr., male    DOB: 1946-04-21, 72 y.o.   MRN: [id number]  [place] [place]  [place].  Now taking azithro Mon,Weds FRi.  On asthmanex.  Also on a new mist that he will need to call me about (stiolto?)  Doing well from COPD standpoint. . Diabetes: Had 400 bS at home but A1C is great today.  [facility] heart - no right sided problems  [facility].  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  [facility]   Review of Systems     Objective:   Physical Exam  VS and wt noted Lungs increased AP diameter and prolonged exp phase.  No wheeze. Cardiac RRR without m or g Ext no edema.        Assessment & Plan:

## 2018-04-05 NOTE — Assessment & Plan Note (Signed)
REfilled viagra at patient request.

## 2018-04-18 ENCOUNTER — Encounter (HOSPITAL_COMMUNITY)
Admission: RE | Admit: 2018-04-18 | Discharge: 2018-04-18 | Disposition: A | Discharge status: Home / Self Care | Payer: No Typology Code available for payment source | Source: Ambulatory Visit | Attending: Pulmonary Disease | Admitting: Pulmonary Disease

## 2018-04-18 DIAGNOSIS — Z79899 Other long term (current) drug therapy: Secondary | ICD-10-CM | POA: Insufficient documentation

## 2018-04-18 DIAGNOSIS — I129 Hypertensive chronic kidney disease with stage 1 through stage 4 chronic kidney disease, or unspecified chronic kidney disease: Secondary | ICD-10-CM | POA: Diagnosis not present

## 2018-04-18 DIAGNOSIS — J449 Chronic obstructive pulmonary disease, unspecified: Secondary | ICD-10-CM | POA: Diagnosis not present

## 2018-04-18 DIAGNOSIS — J438 Other emphysema: Secondary | ICD-10-CM

## 2018-04-18 DIAGNOSIS — Z87891 Personal history of nicotine dependence: Secondary | ICD-10-CM | POA: Insufficient documentation

## 2018-04-18 DIAGNOSIS — E785 Hyperlipidemia, unspecified: Secondary | ICD-10-CM | POA: Diagnosis not present

## 2018-04-18 DIAGNOSIS — N189 Chronic kidney disease, unspecified: Secondary | ICD-10-CM | POA: Diagnosis not present

## 2018-04-18 DIAGNOSIS — J439 Emphysema, unspecified: Secondary | ICD-10-CM | POA: Diagnosis not present

## 2018-04-18 NOTE — Progress Notes (Signed)
Daily Session Note  Patient Details  Name: Michael W Blash Jr. MRN: [id number] Date of Birth: 01/16/1946 Referring Provider:     Pulmonary Rehab Walk Test from 04/04/2018 in [facility]  Referring Provider  Dr. Yacoub      Encounter Date: 04/18/2018  Check In: Session Check In - 04/18/18 1226      Check-In   Supervising physician immediately available to respond to emergencies  [facility] immediately available    Physician(s)  Dr. Pokhrel    Location  [facility]    Staff Present  Carlette Carlton, RN, BSN;Molly DiVincenzo, MS, ACSM RCEP, Exercise Physiologist;Lisa Hughes, RN;Annedrea Stackhouse, RN, MHA    Medication changes reported      No    Fall or balance concerns reported     No    Warm-up and Cool-down  Performed as group-led instruction    Resistance Training Performed  Yes    VAD Patient?  No    PAD/SET Patient?  No      Pain Assessment   Currently in Pain?  No/denies    Pain Score  0-No pain       Capillary Blood Glucose: No results found for this or any previous visit (from the past 24 hour(s)).  Exercise Prescription Changes - 04/18/18 1300      Response to Exercise   Blood Pressure (Admit)  120/60    Blood Pressure (Exercise)  160/80    Blood Pressure (Exit)  106/72    Heart Rate (Admit)  64 bpm    Heart Rate (Exercise)  106 bpm    Heart Rate (Exit)  76 bpm    Oxygen Saturation (Admit)  99 %    Oxygen Saturation (Exercise)  83 %    Oxygen Saturation (Exit)  98 %    Rating of Perceived Exertion (Exercise)  12    Perceived Dyspnea (Exercise)  1    Duration  Continue with 45 min of aerobic exercise without signs/symptoms of physical distress.    Intensity  THRR unchanged      Resistance Training   Training Prescription  Yes    Weight  ORANGE BANDS    Reps  10-15      Oxygen   Oxygen  Continuous    Liters  4      Recumbant Bike   Level  2    Watts  10    Minutes  17      NuStep   Level  2     SPM  80    Minutes  17    METs  2.3      Track   Laps  8    Minutes  17       Social History   Tobacco Use  Smoking Status Former Smoker  . Packs/day: 1.75  . Years: 40.00  . Pack years: 70.00  . Types: Cigarettes  . Last attempt to quit: 03/01/2004  . Years since quitting: 14.1  Smokeless Tobacco Never Used    Goals Met:  Exercise tolerated well  Goals Unmet:  Not Applicable  Comments: Service time is from 10:30a to 12:15p    Dr. Wesam G. Yacoub is Medical Director for Pulmonary Rehab at [facility]. 

## 2018-04-20 ENCOUNTER — Encounter (HOSPITAL_COMMUNITY)
Admission: RE | Admit: 2018-04-20 | Discharge: 2018-04-20 | Disposition: A | Discharge status: Home / Self Care | Payer: No Typology Code available for payment source | Source: Ambulatory Visit | Attending: Pulmonary Disease | Admitting: Pulmonary Disease

## 2018-04-20 DIAGNOSIS — J439 Emphysema, unspecified: Secondary | ICD-10-CM | POA: Diagnosis not present

## 2018-04-20 DIAGNOSIS — J438 Other emphysema: Secondary | ICD-10-CM

## 2018-04-20 NOTE — Progress Notes (Signed)
Daily Session Note  Patient Details  Name: Michael Robertson. MRN: [id number] Date of Birth: 1945/08/06 Referring Provider:     Pulmonary Rehab Walk Test from 04/04/2018 in [facility]  Referring Provider  Dr. Nelda Marseille      Encounter Date: 04/20/2018  Check In: Session Check In - 04/20/18 1025      Check-In   Supervising physician immediately available to respond to emergencies  [facility] immediately available    Physician(s)  Dr. Horris Latino    Location  [facility]    Staff Present  Maurice Small, RN, BSN;Molly DiVincenzo, MS, ACSM RCEP, Exercise Physiologist;Gretel Cantu Ysidro Evert, Felipe Drone, RN, MHA    Medication changes reported      No    Fall or balance concerns reported     No    Tobacco Cessation  No Change    Warm-up and Cool-down  Performed as group-led instruction    Resistance Training Performed  Yes    VAD Patient?  No    PAD/SET Patient?  No      Pain Assessment   Currently in Pain?  No/denies    Multiple Pain Sites  No       Capillary Blood Glucose: No results found for this or any previous visit (from the past 24 hour(s)).    Social History   Tobacco Use  Smoking Status Former Smoker  . Packs/day: 1.75  . Years: 40.00  . Pack years: 70.00  . Types: Cigarettes  . Last attempt to quit: 03/01/2004  . Years since quitting: 14.1  Smokeless Tobacco Never Used    Goals Met:  Exercise tolerated well No report of cardiac concerns or symptoms Strength training completed today  Goals Unmet:  Not Applicable  Comments: Service time is from 1030 to 1215    Dr. Rush Farmer is Medical Director for Pulmonary Rehab at [facility].

## 2018-04-25 ENCOUNTER — Encounter (HOSPITAL_COMMUNITY)
Admission: RE | Admit: 2018-04-25 | Discharge: 2018-04-25 | Disposition: A | Discharge status: Home / Self Care | Payer: No Typology Code available for payment source | Source: Ambulatory Visit | Attending: Pulmonary Disease | Admitting: Pulmonary Disease

## 2018-04-25 DIAGNOSIS — J438 Other emphysema: Secondary | ICD-10-CM

## 2018-04-25 DIAGNOSIS — J439 Emphysema, unspecified: Secondary | ICD-10-CM | POA: Diagnosis not present

## 2018-04-25 NOTE — Progress Notes (Signed)
Daily Session Note  Patient Details  Name: Michael Robertson. MRN: [id number] Date of Birth: [birth year] Referring Provider:     Pulmonary Rehab Walk Test from 04/04/2018 in [place]  Referring Provider  Dr. Nelda Marseille      Encounter Date: 04/25/2018  Check In: Session Check In - 04/25/18 1158      Check-In   Supervising physician immediately available to respond to emergencies  [facility] immediately available    Physician(s)  Dr. Horris Latino    Location  [facility]    Staff Present  Su Hilt, MS, ACSM RCEP, Exercise Physiologist;Bailee Metter Colletta Maryland, RN, MHA    Medication changes reported      No    Fall or balance concerns reported     No    Tobacco Cessation  No Change    Warm-up and Cool-down  Performed as group-led instruction    Resistance Training Performed  Yes    VAD Patient?  No    PAD/SET Patient?  No      Pain Assessment   Currently in Pain?  No/denies       Capillary Blood Glucose: No results found for this or any previous visit (from the past 24 hour(s)).    Social History   Tobacco Use  Smoking Status Former Smoker  . Packs/day: 1.75  . Years: 40.00  . Pack years: 70.00  . Types: Cigarettes  . Last attempt to quit: 03/01/2004  . Years since quitting: 14.1  Smokeless Tobacco Never Used    Goals Met:  Exercise tolerated well No report of cardiac concerns or symptoms Strength training completed today  Goals Unmet:  Not Applicable  Comments: Service time is from 1030 to 1210    Dr. Rush Farmer is Medical Director for Pulmonary Rehab at [facility].

## 2018-04-27 ENCOUNTER — Encounter (HOSPITAL_COMMUNITY)
Admission: RE | Admit: 2018-04-27 | Discharge: 2018-04-27 | Disposition: A | Discharge status: Home / Self Care | Payer: No Typology Code available for payment source | Source: Ambulatory Visit | Attending: Pulmonary Disease | Admitting: Pulmonary Disease

## 2018-04-27 DIAGNOSIS — J438 Other emphysema: Secondary | ICD-10-CM

## 2018-04-27 DIAGNOSIS — J439 Emphysema, unspecified: Secondary | ICD-10-CM | POA: Diagnosis not present

## 2018-04-27 NOTE — Progress Notes (Signed)
Daily Session Note  Patient Details  Name: Michael Robertson. MRN: [id number] Date of Birth: December 29, 1945 Referring Provider:     Pulmonary Rehab Walk Test from 04/04/2018 in [place]  Referring Provider  Dr. Nelda Marseille      Encounter Date: 04/27/2018  Check In: Session Check In - 04/27/18 1019      Check-In   Supervising physician immediately available to respond to emergencies  [facility] immediately available    Physician(s)  Dr. Herbert Moors    Location  [facility]    Staff Present  Su Hilt, MS, ACSM RCEP, Exercise Physiologist;Reyanna Baley Kris Mouton, MS, Exercise Physiologist;Lisa Colletta Maryland, RN, MHA    Medication changes reported      No    Fall or balance concerns reported     No    Tobacco Cessation  No Change    Warm-up and Cool-down  Performed as group-led instruction    Resistance Training Performed  Yes    VAD Patient?  No    PAD/SET Patient?  No      Pain Assessment   Currently in Pain?  No/denies    Multiple Pain Sites  No       Capillary Blood Glucose: No results found for this or any previous visit (from the past 24 hour(s)).    Social History   Tobacco Use  Smoking Status Former Smoker  . Packs/day: 1.75  . Years: 40.00  . Pack years: 70.00  . Types: Cigarettes  . Last attempt to quit: 03/01/2004  . Years since quitting: 14.1  Smokeless Tobacco Never Used    Goals Met:  Exercise tolerated well  Goals Unmet:  Not Applicable  Comments: Service time is from 10:30a to 12:30p    Dr. Rush Farmer is Medical Director for Pulmonary Rehab at [facility].

## 2018-05-02 ENCOUNTER — Encounter (HOSPITAL_COMMUNITY)
Admission: RE | Admit: 2018-05-02 | Discharge: 2018-05-02 | Disposition: A | Discharge status: Home / Self Care | Payer: No Typology Code available for payment source | Source: Ambulatory Visit | Attending: Pulmonary Disease | Admitting: Pulmonary Disease

## 2018-05-02 VITALS — Wt 165.6 lb

## 2018-05-02 DIAGNOSIS — J438 Other emphysema: Secondary | ICD-10-CM

## 2018-05-02 DIAGNOSIS — J439 Emphysema, unspecified: Secondary | ICD-10-CM | POA: Diagnosis not present

## 2018-05-02 NOTE — Progress Notes (Signed)
Daily Session Note  Patient Details  Name: Michael Robertson. MRN: [id number] Date of Birth: [birth year] Referring Provider:     Pulmonary Rehab Walk Test from 04/04/2018 in [place]  Referring Provider  Dr. Nelda Marseille      Encounter Date: 05/02/2018  Check In: Session Check In - 05/02/18 1015      Check-In   Supervising physician immediately available to respond to emergencies  [facility] immediately available    Physician(s)  Dr. Herbert Moors    Location  [facility]    Staff Present  Hoy Register, MS, Exercise Physiologist;Lisa Ysidro Evert, RN;Molly DiVincenzo, MS, ACSM RCEP, Exercise Physiologist;Annedrea Stackhouse, RN, [place]    Medication changes reported      No    Fall or balance concerns reported     No    Tobacco Cessation  No Change    Warm-up and Cool-down  Performed as group-led instruction    Resistance Training Performed  Yes    VAD Patient?  No    PAD/SET Patient?  No      Pain Assessment   Currently in Pain?  No/denies    Multiple Pain Sites  No       Capillary Blood Glucose: No results found for this or any previous visit (from the past 24 hour(s)).  Exercise Prescription Changes - 05/02/18 1200      Response to Exercise   Blood Pressure (Admit)  102/56    Blood Pressure (Exercise)  138/72    Blood Pressure (Exit)  108/56    Heart Rate (Admit)  88 bpm    Heart Rate (Exercise)  127 bpm    Heart Rate (Exit)  73 bpm    Oxygen Saturation (Admit)  97 %    Oxygen Saturation (Exercise)  89 %    Oxygen Saturation (Exit)  94 %    Rating of Perceived Exertion (Exercise)  14    Perceived Dyspnea (Exercise)  3    Duration  Continue with 45 min of aerobic exercise without signs/symptoms of physical distress.    Intensity  THRR unchanged      Resistance Training   Training Prescription  Yes    Weight  ORANGE BANDS    Reps  10-15      Oxygen   Oxygen  Continuous    Liters  4      Recumbant Bike   Level  3    Watts  10    Minutes  17      NuStep   Level  3    SPM  80    Minutes  17    METs  3      Track   Laps  14    Minutes  17       Social History   Tobacco Use  Smoking Status Former Smoker  . Packs/day: 1.75  . Years: 40.00  . Pack years: 70.00  . Types: Cigarettes  . Last attempt to quit: 03/01/2004  . Years since quitting: 14.1  Smokeless Tobacco Never Used    Goals Met:  Exercise tolerated well  Goals Unmet:  Not Applicable  Comments: Service time is from 10:30A to 12:30P    Dr. Rush Farmer is Medical Director for Pulmonary Rehab at [facility].

## 2018-05-03 NOTE — Progress Notes (Signed)
Pulmonary Individual Treatment Plan  Patient Details  Name: Michael Robertson. MRN: [id number] Date of Birth: March 14, 1946 Referring Provider:     Pulmonary Rehab Walk Test from 04/04/2018 in [facility]  Referring Provider  Dr. Molli Knock      Initial Encounter Date:    Pulmonary Rehab Walk Test from 04/04/2018 in [facility] REHAB  Date  04/04/18      Visit Diagnosis: Other emphysema (HCC)  Patient's Home Medications on Admission:   Current Outpatient Medications:  .  azithromycin (ZITHROMAX) 500 MG tablet, TAKE ONE TABLET BY MOUTH ONCE DAILY WHEN  BREATHING  WORSENS (Patient taking differently: 3 (three) times a week. Mondays, Wednesdays and Fridays), Disp: 5 tablet, Rfl: 6 .  bisoprolol (ZEBETA) 5 MG tablet, TAKE 1 TABLET DAILY, Disp: 90 tablet, Rfl: 3 .  cholecalciferol (VITAMIN D) 1000 units tablet, Take 1,000 Units by mouth daily., Disp: , Rfl:  .  fluticasone (FLONASE) 50 MCG/ACT nasal spray, USE 2 SPRAYS NASALLY DAILY, Disp: 32 g, Rfl: 2 .  guaiFENesin-codeine (VIRTUSSIN A/C) 100-10 MG/5ML syrup, TAKE BY MOUTH TWICE DAILY AS NEEDED FOR COUGH, Disp: 120 mL, Rfl: 2 .  hydrochlorothiazide (HYDRODIURIL) 25 MG tablet, TAKE 1 TABLET DAILY, Disp: 90 tablet, Rfl: 3 .  lisinopril (PRINIVIL,ZESTRIL) 10 MG tablet, TAKE 1 TABLET DAILY, Disp: 90 tablet, Rfl: 3 .  mometasone (ASMANEX) 220 MCG/INH inhaler, Inhale 2 puffs into the lungs at bedtime., Disp: , Rfl:  .  Multiple Vitamins-Minerals (CENTRUM SILVER ADULT 50+ PO), Take by mouth., Disp: , Rfl:  .  omeprazole (PRILOSEC) 40 MG capsule, TAKE 1 CAPSULE DAILY, Disp: 90 capsule, Rfl: 3 .  pravastatin (PRAVACHOL) 40 MG tablet, TAKE 1 TABLET EVERY EVENING, Disp: 90 tablet, Rfl: 3 .  predniSONE (DELTASONE) 20 MG tablet, TAKE ONE TABLET BY MOUTH THREE TIMES DAILY (Patient not taking: Reported on 04/03/2018), Disp: 15 tablet, Rfl: 12 .  sildenafil (VIAGRA) 100 MG tablet, TAKE 1 TABLET AS NEEDED  FOR ERECTILE DYSFUNCTION, Disp: 18 tablet, Rfl: 2 .  tamsulosin (FLOMAX) 0.4 MG CAPS capsule, TAKE 1 CAPSULE DAILY FOR PROSTATE, Disp: 90 capsule, Rfl: 3 .  Tiotropium Bromide-Olodaterol (STIOLTO RESPIMAT) 2.5-2.5 MCG/ACT AERS, Inhale 2 puffs into the lungs daily., Disp: , Rfl:  .  traZODone (DESYREL) 100 MG tablet, TAKE 1 TABLET AT BEDTIME, Disp: 90 tablet, Rfl: 3 .  VENTOLIN HFA 108 (90 Base) MCG/ACT inhaler, USE 2 INHALATIONS EVERY 4 HOURS AS NEEDED. RESCUE FOR SHORTNESS OF BREATH, Disp: 54 g, Rfl: 1  Past Medical History: Past Medical History:  Diagnosis Date  . Chronic kidney disease   . COPD (chronic obstructive pulmonary disease) (HCC)   . GERD (gastroesophageal reflux disease)   . Hyperlipidemia   . Hypertension   . Insomnia     Tobacco Use: Social History   Tobacco Use  Smoking Status Former Smoker  . Packs/day: 1.75  . Years: 40.00  . Pack years: 70.00  . Types: Cigarettes  . Last attempt to quit: 03/01/2004  . Years since quitting: 14.1  Smokeless Tobacco Never Used    Labs: Recent Hydrographic surveyor    Labs for ITP Cardiac and Pulmonary Rehab Latest Ref Rng & Units 10/04/2014 10/23/2015 11/17/2016 10/29/2017 04/05/2018   Cholestrol 100 - 199 mg/dL 409 811 914 - -   LDLCALC 0 - 99 mg/dL 83 79 782(N) - -   LDLDIRECT mg/dL - - - - -   HDL >56 mg/dL 42 21(H) 46 - -  Trlycerides 0 - 149 mg/dL 194(H) 202(H) 170(H) - -   Hemoglobin A1c 0.0 - 7.0 % - - - 6.6 5.8   PHART 7.350 - 7.450 - - - - -   PCO2ART 35.0 - 45.0 mmHg - - - - -   HCO3 20.0 - 24.0 mEq/L - - - - -   TCO2 0 - 100 mmol/L - - - - -   O2SAT % - - - - -      Capillary Blood Glucose: No results found for: GLUCAP   Pulmonary Assessment Scores: Pulmonary Assessment Scores    Row Name 04/04/18 1641         ADL UCSD   ADL Phase  Entry       mMRC Score   mMRC Score  3        Pulmonary Function Assessment:   Exercise Target Goals: Exercise Program Goal: Individual exercise prescription set  using results from initial 6 min walk test and THRR while considering  patient's activity barriers and safety.   Exercise Prescription Goal: Initial exercise prescription builds to 30-45 minutes a day of aerobic activity, 2-3 days per week.  Home exercise guidelines will be given to patient during program as part of exercise prescription that the participant will acknowledge.  Activity Barriers & Risk Stratification: Activity Barriers & Cardiac Risk Stratification - 04/03/18 1159      Activity Barriers & Cardiac Risk Stratification   Activity Barriers  History of Falls;Shortness of Breath       6 Minute Walk: 6 Minute Walk    Row Name 04/04/18 1641         6 Minute Walk   Phase  Initial     Distance  800 feet     Walk Time  6 minutes     # of Rest Breaks  0     MPH  1.5     METS  2.15     RPE  13     Perceived Dyspnea   1     Symptoms  Yes (comment)     Comments  used wheelchair     Resting HR  81 bpm     Resting BP  128/66     Resting Oxygen Saturation   94 %     Exercise Oxygen Saturation  during 6 min walk  86 %     Max Ex. HR  121 bpm     Max Ex. BP  148/80       Interval HR   1 Minute HR  84     2 Minute HR  88     3 Minute HR  88     4 Minute HR  110     5 Minute HR  121     6 Minute HR  121     2 Minute Post HR  113     Interval Heart Rate?  Yes       Interval Oxygen   Interval Oxygen?  Yes     Baseline Oxygen Saturation %  94 %     1 Minute Oxygen Saturation %  94 %     1 Minute Liters of Oxygen  2 L     2 Minute Oxygen Saturation %  91 %     2 Minute Liters of Oxygen  2 L     3 Minute Oxygen Saturation %  91 %     3 Minute Liters of Oxygen  2 L     4 Minute Oxygen Saturation %  86 %     4 Minute Liters of Oxygen  2 L     5 Minute Oxygen Saturation %  86 %     5 Minute Liters of Oxygen  4 L     6 Minute Oxygen Saturation %  86 %     6 Minute Liters of Oxygen  4 L     2 Minute Post Oxygen Saturation %  90 %     2 Minute Post Liters of Oxygen  4 L         Oxygen Initial Assessment: Oxygen Initial Assessment - 04/04/18 1640      Home Oxygen   Home Oxygen Device  Portable Concentrator    Sleep Oxygen Prescription  Continuous    Liters per minute  2    Home Exercise Oxygen Prescription  Continuous    Liters per minute  2    Home at Rest Exercise Oxygen Prescription  Continuous    Liters per minute  2    Compliance with Home Oxygen Use  Yes      Initial 6 min Walk   Oxygen Used  Continuous;E-Tanks    Liters per minute  4      Program Oxygen Prescription   Program Oxygen Prescription  Continuous;E-Tanks    Liters per minute  4      Intervention   Short Term Goals  To learn and exhibit compliance with exercise, home and travel O2 prescription;To learn and understand importance of maintaining oxygen saturations>88%;To learn and demonstrate proper use of respiratory medications;To learn and understand importance of monitoring SPO2 with pulse oximeter and demonstrate accurate use of the pulse oximeter.;To learn and demonstrate proper pursed lip breathing techniques or other breathing techniques.    Long  Term Goals  Exhibits compliance with exercise, home and travel O2 prescription;Verbalizes importance of monitoring SPO2 with pulse oximeter and return demonstration;Maintenance of O2 saturations>88%;Exhibits proper breathing techniques, such as pursed lip breathing or other method taught during program session;Compliance with respiratory medication;Demonstrates proper use of MDI's       Oxygen Re-Evaluation: Oxygen Re-Evaluation    Row Name 05/02/18 1555             Program Oxygen Prescription   Program Oxygen Prescription  Continuous;E-Tanks       Liters per minute  4         Home Oxygen   Home Oxygen Device  Portable Concentrator       Sleep Oxygen Prescription  Continuous       Liters per minute  2       Home Exercise Oxygen Prescription  Continuous       Liters per minute  2       Home at Rest Exercise Oxygen  Prescription  Continuous       Liters per minute  2       Compliance with Home Oxygen Use  Yes         Goals/Expected Outcomes   Short Term Goals  To learn and exhibit compliance with exercise, home and travel O2 prescription;To learn and understand importance of maintaining oxygen saturations>88%;To learn and demonstrate proper use of respiratory medications;To learn and understand importance of monitoring SPO2 with pulse oximeter and demonstrate accurate use of the pulse oximeter.;To learn and demonstrate proper pursed lip breathing techniques or other breathing techniques.       Long  Term Goals  Exhibits  compliance with exercise, home and travel O2 prescription;Verbalizes importance of monitoring SPO2 with pulse oximeter and return demonstration;Maintenance of O2 saturations>88%;Exhibits proper breathing techniques, such as pursed lip breathing or other method taught during program session;Compliance with respiratory medication;Demonstrates proper use of MDI's          Oxygen Discharge (Final Oxygen Re-Evaluation): Oxygen Re-Evaluation - 05/02/18 1555      Program Oxygen Prescription   Program Oxygen Prescription  Continuous;E-Tanks    Liters per minute  4      Home Oxygen   Home Oxygen Device  Portable Concentrator    Sleep Oxygen Prescription  Continuous    Liters per minute  2    Home Exercise Oxygen Prescription  Continuous    Liters per minute  2    Home at Rest Exercise Oxygen Prescription  Continuous    Liters per minute  2    Compliance with Home Oxygen Use  Yes      Goals/Expected Outcomes   Short Term Goals  To learn and exhibit compliance with exercise, home and travel O2 prescription;To learn and understand importance of maintaining oxygen saturations>88%;To learn and demonstrate proper use of respiratory medications;To learn and understand importance of monitoring SPO2 with pulse oximeter and demonstrate accurate use of the pulse oximeter.;To learn and demonstrate  proper pursed lip breathing techniques or other breathing techniques.    Long  Term Goals  Exhibits compliance with exercise, home and travel O2 prescription;Verbalizes importance of monitoring SPO2 with pulse oximeter and return demonstration;Maintenance of O2 saturations>88%;Exhibits proper breathing techniques, such as pursed lip breathing or other method taught during program session;Compliance with respiratory medication;Demonstrates proper use of MDI's       Initial Exercise Prescription: Initial Exercise Prescription - 04/04/18 1600      Date of Initial Exercise RX and Referring Provider   Date  04/04/18    Referring Provider  Dr. Molli Knock      Oxygen   Oxygen  Continuous    Liters  4      Recumbant Bike   Level  2    Watts  10    Minutes  17      NuStep   Level  2    SPM  80    Minutes  17    METs  1.5      Track   Laps  5    Minutes  17      Prescription Details   Frequency (times per week)  2    Duration  Progress to 45 minutes of aerobic exercise without signs/symptoms of physical distress      Intensity   THRR 40-80% of Max Heartrate  59-118    Ratings of Perceived Exertion  11-13    Perceived Dyspnea  0-4      Progression   Progression  Continue to progress workloads to maintain intensity without signs/symptoms of physical distress.      Resistance Training   Training Prescription  Yes    Weight  ORANGE BANDS    Reps  10-15       Perform Capillary Blood Glucose checks as needed.  Exercise Prescription Changes: Exercise Prescription Changes    Row Name 04/18/18 1300 05/02/18 1200           Response to Exercise   Blood Pressure (Admit)  120/60  102/56      Blood Pressure (Exercise)  160/80  138/72      Blood Pressure (Exit)  106/72  108/56  Heart Rate (Admit)  64 bpm  88 bpm      Heart Rate (Exercise)  106 bpm  127 bpm      Heart Rate (Exit)  76 bpm  73 bpm      Oxygen Saturation (Admit)  99 %  97 %      Oxygen Saturation (Exercise)   83 %  89 %      Oxygen Saturation (Exit)  98 %  94 %      Rating of Perceived Exertion (Exercise)  12  14      Perceived Dyspnea (Exercise)  1  3      Duration  Continue with 45 min of aerobic exercise without signs/symptoms of physical distress.  Continue with 45 min of aerobic exercise without signs/symptoms of physical distress.      Intensity  THRR unchanged  THRR unchanged        Resistance Training   Training Prescription  Yes  Yes      Weight  ORANGE BANDS  ORANGE BANDS      Reps  10-15  10-15        Oxygen   Oxygen  Continuous  Continuous      Liters  4  4        Recumbant Bike   Level  2  3      Watts  10  10      Minutes  17  17        NuStep   Level  2  3      SPM  80  80      Minutes  17  17      METs  2.3  3        Track   Laps  8  14      Minutes  17  17         Exercise Comments:   Exercise Goals and Review: Exercise Goals    Row Name 04/03/18 1041             Exercise Goals   Increase Physical Activity  Yes       Intervention  Provide advice, education, support and counseling about physical activity/exercise needs.;Develop an individualized exercise prescription for aerobic and resistive training based on initial evaluation findings, risk stratification, comorbidities and participant's personal goals.       Expected Outcomes  Short Term: Attend rehab on a regular basis to increase amount of physical activity.;Long Term: Exercising regularly at least 3-5 days a week.;Long Term: Add in home exercise to make exercise part of routine and to increase amount of physical activity.       Increase Strength and Stamina  Yes       Intervention  Provide advice, education, support and counseling about physical activity/exercise needs.;Develop an individualized exercise prescription for aerobic and resistive training based on initial evaluation findings, risk stratification, comorbidities and participant's personal goals.       Expected Outcomes  Short Term:  Increase workloads from initial exercise prescription for resistance, speed, and METs.;Short Term: Perform resistance training exercises routinely during rehab and add in resistance training at home;Long Term: Improve cardiorespiratory fitness, muscular endurance and strength as measured by increased METs and functional capacity ( )       Able to understand and use rate of perceived exertion (RPE) scale  Yes       Intervention  Provide education and explanation on how to use RPE scale  Expected Outcomes  Short Term: Able to use RPE daily in rehab to express subjective intensity level;Long Term:  Able to use RPE to guide intensity level when exercising independently       Able to understand and use Dyspnea scale  Yes       Intervention  Provide education and explanation on how to use Dyspnea scale       Expected Outcomes  Short Term: Able to use Dyspnea scale daily in rehab to express subjective sense of shortness of breath during exertion;Long Term: Able to use Dyspnea scale to guide intensity level when exercising independently       Knowledge and understanding of Target Heart Rate Range (THRR)  Yes       Intervention  Provide education and explanation of THRR including how the numbers were predicted and where they are located for reference       Expected Outcomes  Short Term: Able to state/look up THRR;Long Term: Able to use THRR to govern intensity when exercising independently;Short Term: Able to use daily as guideline for intensity in rehab       Understanding of Exercise Prescription  Yes       Intervention  Provide education, explanation, and written materials on patient's individual exercise prescription       Expected Outcomes  Short Term: Able to explain program exercise prescription;Long Term: Able to explain home exercise prescription to exercise independently          Exercise Goals Re-Evaluation : Exercise Goals Re-Evaluation    Row Name 05/02/18 1555              Exercise Goal Re-Evaluation   Exercise Goals Review  Increase Physical Activity;Increase Strength and Stamina;Able to understand and use rate of perceived exertion (RPE) scale;Able to understand and use Dyspnea scale;Knowledge and understanding of Target Heart Rate Range (THRR);Understanding of Exercise Prescription       Comments  Patient has only completed 5 rehab sessions. He is progressing well and seems motivated to make workload changes. He is able to walk 14 laps (200 ft each) in 15 minutes. Will cont to monitor and progress patient as able.       Expected Outcomes  Through exercise at rehab and at home, patient will increase functional ability, have a decrease in sob with ADL's, and establish an exercise regimine at home.          Discharge Exercise Prescription (Final Exercise Prescription Changes): Exercise Prescription Changes - 05/02/18 1200      Response to Exercise   Blood Pressure (Admit)  102/56    Blood Pressure (Exercise)  138/72    Blood Pressure (Exit)  108/56    Heart Rate (Admit)  88 bpm    Heart Rate (Exercise)  127 bpm    Heart Rate (Exit)  73 bpm    Oxygen Saturation (Admit)  97 %    Oxygen Saturation (Exercise)  89 %    Oxygen Saturation (Exit)  94 %    Rating of Perceived Exertion (Exercise)  14    Perceived Dyspnea (Exercise)  3    Duration  Continue with 45 min of aerobic exercise without signs/symptoms of physical distress.    Intensity  THRR unchanged      Resistance Training   Training Prescription  Yes    Weight  ORANGE BANDS    Reps  10-15      Oxygen   Oxygen  Continuous    Liters  4  Recumbant Bike   Level  3    Watts  10    Minutes  17      NuStep   Level  3    SPM  80    Minutes  17    METs  3      Track   Laps  14    Minutes  17       Nutrition:  Target Goals: Understanding of nutrition guidelines, daily intake of sodium 1500mg , cholesterol 200mg , calories 30% from fat and 7% or less from saturated fats, daily to have 5  or more servings of fruits and vegetables.  Biometrics:    Nutrition Therapy Plan and Nutrition Goals: Nutrition Therapy & Goals - 04/03/18 1445      Nutrition Therapy   Diet  general healthful, low sodium      Personal Nutrition Goals   Nutrition Goal  Pt to identify and limit food sources of sodium.      Intervention Plan   Intervention  Prescribe, educate and counsel regarding individualized specific dietary modifications aiming towards targeted core components such as weight, hypertension, lipid management, diabetes, heart failure and other comorbidities.    Expected Outcomes  Short Term Goal: Understand basic principles of dietary content, such as calories, fat, sodium, cholesterol and nutrients.;Long Term Goal: Adherence to prescribed nutrition plan.       Nutrition Assessments: Nutrition Assessments - 04/03/18 1447      Rate Your Plate Scores   Pre Score  47       Nutrition Goals Re-Evaluation:   Nutrition Goals Discharge (Final Nutrition Goals Re-Evaluation):   Psychosocial: Target Goals: Acknowledge presence or absence of significant depression and/or stress, maximize coping skills, provide positive support system. Participant is able to verbalize types and ability to use techniques and skills needed for reducing stress and depression.  Initial Review & Psychosocial Screening: Initial Psych Review & Screening - 04/03/18 1046      Initial Review   Current issues with  Current Anxiety/Panic      Family Dynamics   Good Support System?  Yes    Comments  anxious about getting pneumonia so he stays inside for the winter months - grey days he feels more depressed.  happily married for 6 years      Barriers   Psychosocial barriers to participate in program  The patient should benefit from training in stress management and relaxation.      Screening Interventions   Interventions  Encouraged to exercise    Expected Outcomes  Long Term goal: The participant improves  quality of Life and PHQ9 Scores as seen by post scores and/or verbalization of changes;Short Term goal: Identification and review with participant of any Quality of Life or Depression concerns found by scoring the questionnaire.       Quality of Life Scores:  Scores of 19 and below usually indicate a poorer quality of life in these areas.  A difference of  2-3 points is a clinically meaningful difference.  A difference of 2-3 points in the total score of the Quality of Life Index has been associated with significant improvement in overall quality of life, self-image, physical symptoms, and general health in studies assessing change in quality of life.  PHQ-9: Recent Review Flowsheet Data    Depression screen [facility] 2/9 04/05/2018 04/03/2018 11/23/2017 11/23/2017 04/14/2017   Decreased Interest 0 0 0 0 0   Down, Depressed, Hopeless 0 1 0 0 0   PHQ - 2 Score  0 1 0 0 0   Altered sleeping - 3 - - -   Tired, decreased energy - 3  - - -   Change in appetite - 0 - - -   Feeling bad or failure about yourself  - 1 - - -   Trouble concentrating - 0 - - -   Moving slowly or fidgety/restless - 0 - - -   Suicidal thoughts - 0 - - -   PHQ-9 Score - 8 - - -   Difficult doing work/chores - Not difficult at all - - -     Interpretation of Total Score  Total Score Depression Severity:  1-4 = Minimal depression, 5-9 = Mild depression, 10-14 = Moderate depression, 15-19 = Moderately severe depression, 20-27 = Severe depression   Psychosocial Evaluation and Intervention: Psychosocial Evaluation - 05/03/18 1207      Psychosocial Evaluation & Interventions   Comments  no immediate barriers to participating in pulmonary rehab at this time due to the fall season    Expected Outcomes  Pt will feel confident in the cold weather months and report less fear/anxiety regarding the possibility of getting cold       Psychosocial Re-Evaluation: Psychosocial Re-Evaluation    Row Name 05/03/18 1206 05/03/18 1207             Psychosocial Re-Evaluation   Current issues with  Current Anxiety/Panic  -      Comments  -  For right now, pt does not have any anxiety regarding getting P[place] due to the fall season      Expected Outcomes  -  Pt will report decreased anxiety regarding fear of having Pneumonia during the winte months      Interventions  -  Stress management education;Relaxation education;Encouraged to attend Pulmonary Rehabilitation for the exercise      Continue Psychosocial Services   -  Follow up required by staff         Psychosocial Discharge (Final Psychosocial Re-Evaluation): Psychosocial Re-Evaluation - 05/03/18 1207      Psychosocial Re-Evaluation   Comments  For right now, pt does not have any anxiety regarding getting P[place] due to the fall season    Expected Outcomes  Pt will report decreased anxiety regarding fear of having Pneumonia during the winte months    Interventions  Stress management education;Relaxation education;Encouraged to attend Pulmonary Rehabilitation for the exercise    Continue Psychosocial Services   Follow up required by staff       Education: Education Goals: Education classes will be provided on a weekly basis, covering required topics. Participant will state understanding/return demonstration of topics presented.  Learning Barriers/Preferences: Learning Barriers/Preferences - 04/03/18 1052      Learning Barriers/Preferences   Learning Barriers  Sight;Hearing    Learning Preferences  Skilled Demonstration;Group Instruction;Individual Instruction;Audio;Verbal Instruction       Education Topics: Risk Factor Reduction:  -Group instruction that is supported by a PowerPoint presentation. Instructor discusses the definition of a risk factor, different risk factors for pulmonary disease, and how the heart and lungs work together.     Nutrition for Pulmonary Patient:  -Group instruction provided by PowerPoint slides, verbal discussion, and written  materials to support subject matter. The instructor gives an explanation and review of healthy diet recommendations, which includes a discussion on weight management, recommendations for fruit and vegetable consumption, as well as protein, fluid, caffeine, fiber, sodium, sugar, and alcohol. Tips for eating when patients are short of breath are discussed.  Pursed Lip Breathing:  -Group instruction that is supported by demonstration and informational handouts. Instructor discusses the benefits of pursed lip and diaphragmatic breathing and detailed demonstration on how to preform both.     Oxygen Safety:  -Group instruction provided by PowerPoint, verbal discussion, and written material to support subject matter. There is an overview of "What is Oxygen" and "Why do we need it".  Instructor also reviews how to create a safe environment for oxygen use, the importance of using oxygen as prescribed, and the risks of noncompliance. There is a brief discussion on traveling with oxygen and resources the patient may utilize.   PULMONARY REHAB OTHER RESPIRATORY from 04/27/2018 in [facility] [facility] CARDIAC REHAB  Date  04/20/18  Educator  Kirt Boys  Instruction Review Code  1- Verbalizes Understanding      Oxygen Equipment:  -Group instruction provided by [employer] utilizing handouts, written materials, and Chief Technology Officer.   PULMONARY REHAB OTHER RESPIRATORY from 04/27/2018 in [facility] CARDIAC REHAB  Date  04/27/18  Educator  Patsy Lager  Instruction Review Code  1- Verbalizes Understanding      Signs and Symptoms:  -Group instruction provided by written material and verbal discussion to support subject matter. Warning signs and symptoms of infection, stroke, and heart attack are reviewed and when to call the physician/911 reinforced. Tips for preventing the spread of infection discussed.   Advanced Directives:  -Group instruction provided by verbal  instruction and written material to support subject matter. Instructor reviews Advanced Directive laws and proper instruction for filling out document.   Pulmonary Video:  -Group video education that reviews the importance of medication and oxygen compliance, exercise, good nutrition, pulmonary hygiene, and pursed lip and diaphragmatic breathing for the pulmonary patient.   Exercise for the Pulmonary Patient:  -Group instruction that is supported by a PowerPoint presentation. Instructor discusses benefits of exercise, core components of exercise, frequency, duration, and intensity of an exercise routine, importance of utilizing pulse oximetry during exercise, safety while exercising, and options of places to exercise outside of rehab.     Pulmonary Medications:  -Verbally interactive group education provided by instructor with focus on inhaled medications and proper administration.   Anatomy and Physiology of the Respiratory System and Intimacy:  -Group instruction provided by PowerPoint, verbal discussion, and written material to support subject matter. Instructor reviews respiratory cycle and anatomical components of the respiratory system and their functions. Instructor also reviews differences in obstructive and restrictive respiratory diseases with examples of each. Intimacy, Sex, and Sexuality differences are reviewed with a discussion on how relationships can change when diagnosed with pulmonary disease. Common sexual concerns are reviewed.   MD DAY -A group question and answer session with a medical doctor that allows participants to ask questions that relate to their pulmonary disease state.   OTHER EDUCATION -Group or individual verbal, written, or video instructions that support the educational goals of the pulmonary rehab program.   Holiday Eating Survival Tips:  -Group instruction provided by PowerPoint slides, verbal discussion, and written materials to support subject  matter. The instructor gives patients tips, tricks, and techniques to help them not only survive but enjoy the holidays despite the onslaught of food that accompanies the holidays.   Knowledge Questionnaire Score:   Core Components/Risk Factors/Patient Goals at Admission: Personal Goals and Risk Factors at Admission - 04/03/18 1053      Core Components/Risk Factors/Patient Goals on Admission    Weight Management  Weight Loss;Yes  Intervention  Weight Management/Obesity: Establish reasonable short term and long term weight goals.;Weight Management: Provide education and appropriate resources to help participant work on and attain dietary goals.;Obesity: Provide education and appropriate resources to help participant work on and attain dietary goals.;Weight Management: Develop a combined nutrition and exercise program designed to reach desired caloric intake, while maintaining appropriate intake of nutrient and fiber, sodium and fats, and appropriate energy expenditure required for the weight goal.    Admit Weight  168 lb 10.4 oz (76.5 kg)    Goal Weight: Short Term  163 lb (73.9 kg)    Goal Weight: Long Term  158 lb (71.7 kg)    Expected Outcomes  Understanding of distribution of calorie intake throughout the day with the consumption of 4-5 meals/snacks;Understanding recommendations for meals to include 15-35% energy as protein, 25-35% energy from fat, 35-60% energy from carbohydrates, less than 200mg  of dietary cholesterol, 20-35 gm of total fiber daily;Weight Loss: Understanding of general recommendations for a balanced deficit meal plan, which promotes 1-2 lb weight loss per week and includes a negative energy balance of [phone] kcal/d;Short Term: Continue to assess and modify interventions until short term weight is achieved;Long Term: Adherence to nutrition and physical activity/exercise program aimed toward attainment of established weight goal    Improve shortness of breath with ADL's  Yes     Intervention  Provide education, individualized exercise plan and daily activity instruction to help decrease symptoms of SOB with activities of daily living.    Expected Outcomes  Short Term: Improve cardiorespiratory fitness to achieve a reduction of symptoms when performing ADLs;Long Term: Be able to perform more ADLs without symptoms or delay the onset of symptoms    Hypertension  Yes    Intervention  Provide education on lifestyle modifcations including regular physical activity/exercise, weight management, moderate sodium restriction and increased consumption of fresh fruit, vegetables, and low fat dairy, alcohol moderation, and smoking cessation.;Monitor prescription use compliance.    Expected Outcomes  Short Term: Continued assessment and intervention until BP is < 140/19mm HG in hypertensive participants. < 130/34mm HG in hypertensive participants with diabetes, heart failure or chronic kidney disease.;Long Term: Maintenance of blood pressure at goal levels.    Lipids  Yes    Intervention  Provide education and support for participant on nutrition & aerobic/resistive exercise along with prescribed medications to achieve LDL 70mg , HDL >40mg .    Expected Outcomes  Short Term: Participant states understanding of desired cholesterol values and is compliant with medications prescribed. Participant is following exercise prescription and nutrition guidelines.;Long Term: Cholesterol controlled with medications as prescribed, with individualized exercise RX and with personalized nutrition plan. Value goals: LDL < 70mg , HDL > 40 mg.       Core Components/Risk Factors/Patient Goals Review:  Goals and Risk Factor Review    Row Name 05/03/18 1210             Core Components/Risk Factors/Patient Goals Review   Personal Goals Review  Weight Management/Obesity;Heart Failure;Develop more efficient breathing techniques such as purse lipped breathing and diaphragmatic breathing and practicing  self-pacing with activity.;Increase knowledge of respiratory medications and ability to use respiratory devices properly.;Hypertension;Lipids;Improve shortness of breath with ADL's       Review  Pt is off to a great start.  Pt has completed 5 exercise sessions.  Pt weight remains unchanged from his first exercise session. Pt able to maintain oxygen saturations within rehab protocol with increase of his oxygen from 2L to 4L.  Pt saturation  while on the track remains his lowest 88-91%  Pt requires coaching with his PLB techniques. I am hopeful that pt will become independent with continued practice.  Recumbent bike increase to level 3, nustep level 3 and averages 14 laps.  Pt reports stable lipid readings,  Will plan to resolve this goal at next 30 day review. BP readings are within normal limits with the expected increase in bp on exertion.  Continue to monitor pt progress toward his goal during the next 30 day review.        Expected Outcomes  See Admission Goals/Oucomes          Core Components/Risk Factors/Patient Goals at Discharge (Final Review):  Goals and Risk Factor Review - 05/03/18 1210      Core Components/Risk Factors/Patient Goals Review   Personal Goals Review  Weight Management/Obesity;Heart Failure;Develop more efficient breathing techniques such as purse lipped breathing and diaphragmatic breathing and practicing self-pacing with activity.;Increase knowledge of respiratory medications and ability to use respiratory devices properly.;Hypertension;Lipids;Improve shortness of breath with ADL's    Review  Pt is off to a great start.  Pt has completed 5 exercise sessions.  Pt weight remains unchanged from his first exercise session. Pt able to maintain oxygen saturations within rehab protocol with increase of his oxygen from 2L to 4L.  Pt saturation while on the track remains his lowest 88-91%  Pt requires coaching with his PLB techniques. I am hopeful that pt will become independent with  continued practice.  Recumbent bike increase to level 3, nustep level 3 and averages 14 laps.  Pt reports stable lipid readings,  Will plan to resolve this goal at next 30 day review. BP readings are within normal limits with the expected increase in bp on exertion.  Continue to monitor pt progress toward his goal during the next 30 day review.     Expected Outcomes  See Admission Goals/Oucomes       ITP Comments: ITP Comments    Row Name 04/03/18 1013 05/03/18 1222         ITP Comments  Dr. Charlestine Massed, Medical Director, Pulmonary Rehab  Dr. Charlestine Massed, Medical Director, Pulmonary Rehab         Comments: Pt has completed 5 exercise sessions. Continue to monitor pt progress. Alanson Aly, BSN Cardiac and Emergency planning/management officer

## 2018-05-04 ENCOUNTER — Encounter (HOSPITAL_COMMUNITY)
Admission: RE | Admit: 2018-05-04 | Discharge: 2018-05-04 | Disposition: A | Discharge status: Home / Self Care | Payer: No Typology Code available for payment source | Source: Ambulatory Visit | Attending: Pulmonary Disease | Admitting: Pulmonary Disease

## 2018-05-04 DIAGNOSIS — J439 Emphysema, unspecified: Secondary | ICD-10-CM | POA: Diagnosis not present

## 2018-05-04 DIAGNOSIS — J438 Other emphysema: Secondary | ICD-10-CM

## 2018-05-04 NOTE — Progress Notes (Signed)
I have reviewed a Home Exercise Prescription with Michael Robertson. Marland Kitchen Michael Robertson is  currently exercising at home.  The patient was advised to walk 2 days a week for 30 minutes.  Michael Robertson and I discussed how to progress their exercise prescription.  The patient stated that their goals were to decrease weight to 160 pounds and to increase walking endurance.  The patient stated that they understand the exercise prescription.  We reviewed exercise guidelines, target heart rate during exercise, RPE Scale, weather conditions, NTG use, endpoints for exercise, warmup and cool down.  Patient is encouraged to come to me with any questions. I will continue to follow up with the patient to assist them with progression and safety.

## 2018-05-04 NOTE — Progress Notes (Signed)
Daily Session Note  Patient Details  Name: Michael Robertson. MRN: [id number] Date of Birth: Apr 20, 1946 Referring Provider:     Pulmonary Rehab Walk Test from 04/04/2018 in [place]  Referring Provider  Dr. Nelda Marseille      Encounter Date: 05/04/2018  Check In: Session Check In - 05/04/18 1102      Check-In   Supervising physician immediately available to respond to emergencies  [facility] immediately available    Physician(s)  Dr. Ree Kida    Location  [facility]    Staff Present  Hoy Register, MS, Exercise Physiologist;Lisa Ysidro Evert, Felipe Drone, RN, MHA;Molly DiVincenzo, MS, ACSM RCEP, Exercise Physiologist    Medication changes reported      No    Fall or balance concerns reported     No    Tobacco Cessation  No Change    Warm-up and Cool-down  Performed as group-led instruction    Resistance Training Performed  Yes    VAD Patient?  No    PAD/SET Patient?  No      Pain Assessment   Currently in Pain?  No/denies    Multiple Pain Sites  No       Capillary Blood Glucose: No results found for this or any previous visit (from the past 24 hour(s)).  Exercise Prescription Changes - 05/04/18 1200      Home Exercise Plan   Plans to continue exercise at  Home (comment)    Frequency  Add 2 additional days to program exercise sessions.       Social History   Tobacco Use  Smoking Status Former Smoker  . Packs/day: 1.75  . Years: 40.00  . Pack years: 70.00  . Types: Cigarettes  . Last attempt to quit: 03/01/2004  . Years since quitting: 14.1  Smokeless Tobacco Never Used    Goals Met:  Proper associated with RPD/PD & O2 Sat  Goals Unmet:  Not Applicable  Comments:Service time is from 1030 to 1207    Dr. Rush Farmer is Medical Director for Pulmonary Rehab at [facility]

## 2018-05-05 ENCOUNTER — Ambulatory Visit (HOSPITAL_COMMUNITY): Payer: Medicare Other

## 2018-05-09 ENCOUNTER — Encounter (HOSPITAL_COMMUNITY)
Admission: RE | Admit: 2018-05-09 | Discharge: 2018-05-09 | Disposition: A | Discharge status: Home / Self Care | Payer: No Typology Code available for payment source | Source: Ambulatory Visit | Attending: Pulmonary Disease | Admitting: Pulmonary Disease

## 2018-05-09 DIAGNOSIS — J438 Other emphysema: Secondary | ICD-10-CM

## 2018-05-09 DIAGNOSIS — J439 Emphysema, unspecified: Secondary | ICD-10-CM | POA: Diagnosis not present

## 2018-05-09 NOTE — Progress Notes (Signed)
Daily Session Note  Patient Details  Name: Michael Robertson. MRN: [id number] Date of Birth: 1946-06-07 Referring Provider:     Pulmonary Rehab Walk Test from 04/04/2018 in [place]  Referring Provider  Dr. Nelda Marseille      Encounter Date: 05/09/2018  Check In: Session Check In - 05/09/18 1028      Check-In   Supervising physician immediately available to respond to emergencies  [facility] immediately available    Physician(s)  Dr. Ree Kida    Location  [facility]    Staff Present  Hoy Register, MS, Exercise Physiologist;Arlone Lenhardt Ysidro Evert, Felipe Drone, RN, MHA;Molly DiVincenzo, MS, ACSM RCEP, Exercise Physiologist    Medication changes reported      No    Fall or balance concerns reported     No    Tobacco Cessation  No Change    Warm-up and Cool-down  Performed as group-led instruction    Resistance Training Performed  Yes    VAD Patient?  No    PAD/SET Patient?  No      Pain Assessment   Currently in Pain?  No/denies    Multiple Pain Sites  No       Capillary Blood Glucose: No results found for this or any previous visit (from the past 24 hour(s)).    Social History   Tobacco Use  Smoking Status Former Smoker  . Packs/day: 1.75  . Years: 40.00  . Pack years: 70.00  . Types: Cigarettes  . Last attempt to quit: 03/01/2004  . Years since quitting: 14.1  Smokeless Tobacco Never Used    Goals Met:  Exercise tolerated well No report of cardiac concerns or symptoms Strength training completed today  Goals Unmet:  Not Applicable  Comments: Service time is from 1030 to 1200    Dr. Rush Farmer is Medical Director for Pulmonary Rehab at [facility].

## 2018-05-11 ENCOUNTER — Encounter (HOSPITAL_COMMUNITY)
Admission: RE | Admit: 2018-05-11 | Discharge: 2018-05-11 | Disposition: A | Discharge status: Home / Self Care | Payer: No Typology Code available for payment source | Source: Ambulatory Visit | Attending: Pulmonary Disease | Admitting: Pulmonary Disease

## 2018-05-11 DIAGNOSIS — J438 Other emphysema: Secondary | ICD-10-CM

## 2018-05-11 DIAGNOSIS — J439 Emphysema, unspecified: Secondary | ICD-10-CM | POA: Diagnosis not present

## 2018-05-11 NOTE — Progress Notes (Signed)
Daily Session Note  Patient Details  Name: Michael Robertson. MRN: [id number] Date of Birth: 06/12/1946 Referring Provider:     Pulmonary Rehab Walk Test from 04/04/2018 in [place]  Referring Provider  Dr. Nelda Marseille      Encounter Date: 05/11/2018  Check In: Session Check In - 05/11/18 1027      Check-In   Supervising physician immediately available to respond to emergencies  [facility] immediately available    Physician(s)  Dr. Tyrell Antonio    Location  [facility]    Staff Present  Hoy Register, MS, Exercise Physiologist;Lisa Ysidro Evert, Felipe Drone, RN, MHA;Molly DiVincenzo, MS, ACSM RCEP, Exercise Physiologist    Medication changes reported      No    Fall or balance concerns reported     No    Tobacco Cessation  No Change    Warm-up and Cool-down  Performed as group-led instruction    Resistance Training Performed  Yes    VAD Patient?  No    PAD/SET Patient?  No      Pain Assessment   Currently in Pain?  No/denies    Multiple Pain Sites  No       Capillary Blood Glucose: No results found for this or any previous visit (from the past 24 hour(s)).    Social History   Tobacco Use  Smoking Status Former Smoker  . Packs/day: 1.75  . Years: 40.00  . Pack years: 70.00  . Types: Cigarettes  . Last attempt to quit: 03/01/2004  . Years since quitting: 14.2  Smokeless Tobacco Never Used    Goals Met:  Proper associated with RPD/PD & O2 Sat Exercise tolerated well  Goals Unmet:  Not Applicable  Comments: Service time is from 1030 to 1230   Dr. Rush Farmer is Medical Director for Pulmonary Rehab at [facility].

## 2018-05-11 NOTE — Progress Notes (Signed)
[name]. 72 y.o. male   DOB: 15-Aug-1945 MRN: [id number]          Nutrition 1. Other emphysema (HCC)    Past Medical History:  Diagnosis Date  . Chronic kidney disease   . COPD (chronic obstructive pulmonary disease) (HCC)   . GERD (gastroesophageal reflux disease)   . Hyperlipidemia   . Hypertension   . Insomnia      Meds reviewed.  Current Outpatient Medications (Endocrine & Metabolic):  .  predniSONE (DELTASONE) 20 MG tablet, TAKE ONE TABLET BY MOUTH THREE TIMES DAILY (Patient not taking: Reported on 04/03/2018)  Current Outpatient Medications (Cardiovascular):  .  bisoprolol (ZEBETA) 5 MG tablet, TAKE 1 TABLET DAILY .  hydrochlorothiazide (HYDRODIURIL) 25 MG tablet, TAKE 1 TABLET DAILY .  lisinopril (PRINIVIL,ZESTRIL) 10 MG tablet, TAKE 1 TABLET DAILY .  pravastatin (PRAVACHOL) 40 MG tablet, TAKE 1 TABLET EVERY EVENING .  sildenafil (VIAGRA) 100 MG tablet, TAKE 1 TABLET AS NEEDED FOR ERECTILE DYSFUNCTION  Current Outpatient Medications (Respiratory):  .  fluticasone (FLONASE) 50 MCG/ACT nasal spray, USE 2 SPRAYS NASALLY DAILY .  guaiFENesin-codeine (VIRTUSSIN A/C) 100-10 MG/5ML syrup, TAKE BY MOUTH TWICE DAILY AS NEEDED FOR COUGH .  mometasone (ASMANEX) 220 MCG/INH inhaler, Inhale 2 puffs into the lungs at bedtime. .  Tiotropium Bromide-Olodaterol (STIOLTO RESPIMAT) 2.5-2.5 MCG/ACT AERS, Inhale 2 puffs into the lungs daily. .  VENTOLIN HFA 108 (90 Base) MCG/ACT inhaler, USE 2 INHALATIONS EVERY 4 HOURS AS NEEDED. RESCUE FOR SHORTNESS OF BREATH    Current Outpatient Medications (Other):  .  azithromycin (ZITHROMAX) 500 MG tablet, TAKE ONE TABLET BY MOUTH ONCE DAILY WHEN  BREATHING  WORSENS (Patient taking differently: 3 (three) times a week. Mondays, Wednesdays and Fridays) .  cholecalciferol (VITAMIN D) 1000 units tablet, Take 1,000 Units by mouth daily. .  Multiple Vitamins-Minerals (CENTRUM SILVER ADULT 50+ PO), Take by mouth. Marland Kitchen  omeprazole (PRILOSEC) 40 MG  capsule, TAKE 1 CAPSULE DAILY .  tamsulosin (FLOMAX) 0.4 MG CAPS capsule, TAKE 1 CAPSULE DAILY FOR PROSTATE .  traZODone (DESYREL) 100 MG tablet, TAKE 1 TABLET AT BEDTIME  Ht: Ht Readings from Last 1 Encounters:  04/05/18 5\' 11"  (1.803 m)     Wt:  Wt Readings from Last 3 Encounters:  05/02/18 165 lb 9.1 oz (75.1 kg)  04/05/18 168 lb 9.6 oz (76.5 kg)  04/03/18 168 lb 10.4 oz (76.5 kg)       Current tobacco use? No  Labs:  Lipid Panel     Component Value Date/Time   CHOL 183 11/17/2016 1006   TRIG 170 (H) 11/17/2016 1006   HDL 46 11/17/2016 1006   CHOLHDL 4.0 11/17/2016 1006   CHOLHDL 4.5 10/23/2015 1059   VLDL 40 (H) 10/23/2015 1059   LDLCALC 103 (H) 11/17/2016 1006   LDLDIRECT 94 10/20/2012 1108    Lab Results  Component Value Date   HGBA1C 5.8 04/05/2018   Note Spoke with pt. Pt is within a normal weight range.  Pt eats 3 meals a day; most prepared at home.  Pt is making healthy food choices the majority of the time. Pt shared that occasionally (1x a week), he will split a piece of cake or small dessert with his wife. Pt's Rate Your Plate results reviewed with pt. Pt does not avoid salty food; uses canned/ convenience food. Pt adds salt to food.  The role of sodium in lung disease reviewed with pt. Reviewed label reading and discussed gradually decreasing the amount  of sodium consumed daily to less than 1500 mg. Since pt has had elevated levels of blood glucose, discussed managing by replacing refined carbs with complex carbohydrates in his diet. Pt expressed understanding of the information reviewed.  Nutrition Diagnosis ? Excessive sodium intake related to over consumption of processed food as evidenced by frequent consumption of convenience food/ canned vegetables and eating out frequently.  Nutrition Intervention ? Pt's individual nutrition plan and goals reviewed with pt. ? Benefits of adopting healthy eating habits discussed when pt's Rate Your Plate  reviewed.  Goal(s) 1. Pt to identify and limit food sources of sodium. 2. The pt will recognize symptoms that can interfere with adequate oral intake, such as shortness of breath, N/V, early satiety, fatigue, ability to secure and prepare food, taste and smell changes, chewing/swallowing difficulties, and/ or pain when eating. 3. Describe the benefit of including fruits, vegetables, whole grains, and low-fat dairy products in a healthy meal plan. 4. Pt able to name foods that affect blood glucose   Plan:  Pt to attend Pulmonary Nutrition class Will provide client-centered nutrition education as part of interdisciplinary care.    Monitor and Evaluate progress toward nutrition goal with team.   Ross Marcus, MS, RD, LDN 05/11/2018 2:35 PM

## 2018-05-16 ENCOUNTER — Encounter (HOSPITAL_COMMUNITY)
Admission: RE | Admit: 2018-05-16 | Discharge: 2018-05-16 | Disposition: A | Discharge status: Home / Self Care | Payer: No Typology Code available for payment source | Source: Ambulatory Visit | Attending: Pulmonary Disease | Admitting: Pulmonary Disease

## 2018-05-16 VITALS — Wt 165.6 lb

## 2018-05-16 DIAGNOSIS — J438 Other emphysema: Secondary | ICD-10-CM

## 2018-05-16 DIAGNOSIS — J439 Emphysema, unspecified: Secondary | ICD-10-CM | POA: Diagnosis not present

## 2018-05-16 NOTE — Progress Notes (Signed)
Daily Session Note  Patient Details  Name: Michael Robertson. MRN: [id number] Date of Birth: 06-27-1946 Referring Provider:     Pulmonary Rehab Walk Test from 04/04/2018 in [place]  Referring Provider  Dr. Nelda Marseille      Encounter Date: 05/16/2018  Check In: Session Check In - 05/16/18 1204      Check-In   Supervising physician immediately available to respond to emergencies  [facility] immediately available    Physician(s)  Dr. Tyrell Antonio    Location  [facility]    Staff Present  Hoy Register, MS, Exercise Physiologist;Lisa Ysidro Evert, Felipe Drone, RN, MHA;Lilie Vezina, MS, ACSM RCEP, Exercise Physiologist;Carlette Wilber Oliphant, RN, BSN    Medication changes reported      No    Fall or balance concerns reported     No    Tobacco Cessation  No Change    Warm-up and Cool-down  Performed as group-led instruction    Resistance Training Performed  Yes    VAD Patient?  No    PAD/SET Patient?  No      Pain Assessment   Currently in Pain?  No/denies    Multiple Pain Sites  No       Capillary Blood Glucose: No results found for this or any previous visit (from the past 24 hour(s)).  Exercise Prescription Changes - 05/16/18 1200      Response to Exercise   Blood Pressure (Admit)  102/64    Blood Pressure (Exercise)  126/70    Blood Pressure (Exit)  100/60    Heart Rate (Admit)  73 bpm    Heart Rate (Exercise)  124 bpm    Heart Rate (Exit)  96 bpm    Oxygen Saturation (Admit)  97 %    Oxygen Saturation (Exercise)  91 %    Oxygen Saturation (Exit)  98 %    Rating of Perceived Exertion (Exercise)  15    Perceived Dyspnea (Exercise)  2    Duration  Progress to 45 minutes of aerobic exercise without signs/symptoms of physical distress    Intensity  THRR unchanged      Resistance Training   Training Prescription  Yes    Weight  orange bands    Reps  10-15    Time  10 Minutes      Oxygen   Oxygen  Continuous     Liters  4      Recumbant Bike   Level  3    Watts  10    Minutes  17      NuStep   Level  4    SPM  80    Minutes  17    METs  3.1      Track   Laps  12    Minutes  17       Social History   Tobacco Use  Smoking Status Former Smoker  . Packs/day: 1.75  . Years: 40.00  . Pack years: 70.00  . Types: Cigarettes  . Last attempt to quit: 03/01/2004  . Years since quitting: 14.2  Smokeless Tobacco Never Used    Goals Met:  Exercise tolerated well  Goals Unmet:  Not Applicable  Comments: Service time is from 10:30a to 12:00p    Dr. Rush Farmer is Medical Director for Pulmonary Rehab at [facility].

## 2018-05-18 ENCOUNTER — Encounter (HOSPITAL_COMMUNITY): Payer: No Typology Code available for payment source

## 2018-05-23 ENCOUNTER — Encounter (HOSPITAL_COMMUNITY)
Admission: RE | Admit: 2018-05-23 | Discharge: 2018-05-23 | Disposition: A | Discharge status: Home / Self Care | Payer: No Typology Code available for payment source | Source: Ambulatory Visit | Attending: Pulmonary Disease | Admitting: Pulmonary Disease

## 2018-05-23 ENCOUNTER — Encounter (HOSPITAL_COMMUNITY): Payer: Non-veteran care

## 2018-05-23 DIAGNOSIS — J439 Emphysema, unspecified: Secondary | ICD-10-CM | POA: Diagnosis not present

## 2018-05-23 DIAGNOSIS — J438 Other emphysema: Secondary | ICD-10-CM

## 2018-05-23 DIAGNOSIS — E785 Hyperlipidemia, unspecified: Secondary | ICD-10-CM | POA: Diagnosis not present

## 2018-05-23 DIAGNOSIS — J449 Chronic obstructive pulmonary disease, unspecified: Secondary | ICD-10-CM | POA: Diagnosis not present

## 2018-05-23 DIAGNOSIS — Z87891 Personal history of nicotine dependence: Secondary | ICD-10-CM | POA: Diagnosis not present

## 2018-05-23 DIAGNOSIS — N189 Chronic kidney disease, unspecified: Secondary | ICD-10-CM | POA: Diagnosis not present

## 2018-05-23 DIAGNOSIS — Z79899 Other long term (current) drug therapy: Secondary | ICD-10-CM | POA: Diagnosis not present

## 2018-05-23 DIAGNOSIS — I129 Hypertensive chronic kidney disease with stage 1 through stage 4 chronic kidney disease, or unspecified chronic kidney disease: Secondary | ICD-10-CM | POA: Diagnosis not present

## 2018-05-23 NOTE — Progress Notes (Signed)
While exercising at Pulmonary Rehab, Michael Robertson consistently uses 4 liters of continuous flow oxygen. This is provided by [facility]monary [facility]hab [facility]. At home, the patient currently uses 4 for their home oxygen system. While exercising at home, the patient was instructed to use 5 liters. In Pulmonary Rehab today, Michael Robertson walked the track utilizing their own home oxygen system. They used 5 liters pulsed which maintained their oxygen saturation above 94%.

## 2018-05-23 NOTE — Progress Notes (Signed)
Daily Session Note  Patient Details  Name: Michael Robertson. MRN: [id number] Date of Birth: [birth year] Referring Provider:     Pulmonary Rehab Walk Test from 04/04/2018 in [place]  Referring Provider  Dr. Nelda Marseille      Encounter Date: 05/23/2018  Check In: Session Check In - 05/23/18 1030      Check-In   Supervising physician immediately available to respond to emergencies  [facility] immediately available       Capillary Blood Glucose: No results found for this or any previous visit (from the past 24 hour(s)).    Social History   Tobacco Use  Smoking Status Former Smoker  . Packs/day: 1.75  . Years: 40.00  . Pack years: 70.00  . Types: Cigarettes  . Last attempt to quit: 03/01/2004  . Years since quitting: 14.2  Smokeless Tobacco Never Used    Goals Met:  Proper associated with RPD/PD & O2 Sat Exercise tolerated well  Goals Unmet:  Not Applicable  Comments: Service time is from 1030 to 1210    Dr. Rush Farmer is Medical Director for Pulmonary Rehab at [facility].

## 2018-05-25 ENCOUNTER — Encounter (HOSPITAL_COMMUNITY): Payer: Non-veteran care

## 2018-05-25 ENCOUNTER — Encounter (HOSPITAL_COMMUNITY)
Admission: RE | Admit: 2018-05-25 | Discharge: 2018-05-25 | Disposition: A | Discharge status: Home / Self Care | Payer: No Typology Code available for payment source | Source: Ambulatory Visit | Attending: Pulmonary Disease | Admitting: Pulmonary Disease

## 2018-05-25 DIAGNOSIS — J438 Other emphysema: Secondary | ICD-10-CM

## 2018-05-25 DIAGNOSIS — J439 Emphysema, unspecified: Secondary | ICD-10-CM | POA: Diagnosis not present

## 2018-05-25 NOTE — Progress Notes (Signed)
Daily Session Note  Patient Details  Name: Michael Robertson. MRN: [id number] Date of Birth: Jun 13, 1946 Referring Provider:     Pulmonary Rehab Walk Test from 04/04/2018 in [place]  Referring Provider  Dr. Nelda Marseille      Encounter Date: 05/25/2018  Check In: Session Check In - 05/25/18 1030      Check-In   Supervising physician immediately available to respond to emergencies  [facility] immediately available    Physician(s)  Dr. Starla Link    Location  [facility]    Staff Present  Rosebud Poles, RN, BSN;Carlette Wilber Oliphant, RN, Bjorn Loser, MS, Exercise Physiologist;Annedrea Rosezella [place], RN, MHA    Medication changes reported      No    Fall or balance concerns reported     No    Tobacco Cessation  No Change    Warm-up and Cool-down  Performed as group-led instruction    Resistance Training Performed  Yes    VAD Patient?  No    PAD/SET Patient?  No      Pain Assessment   Currently in Pain?  No/denies    Multiple Pain Sites  No       Capillary Blood Glucose: No results found for this or any previous visit (from the past 24 hour(s)).    Social History   Tobacco Use  Smoking Status Former Smoker  . Packs/day: 1.75  . Years: 40.00  . Pack years: 70.00  . Types: Cigarettes  . Last attempt to quit: 03/01/2004  . Years since quitting: 14.2  Smokeless Tobacco Never Used    Goals Met:  Proper associated with RPD/PD & O2 Sat Exercise tolerated well  Goals Unmet:  Not Applicable  Comments: Service time is from 1030 to 1205    Dr. Rush Farmer is Medical Director for Pulmonary Rehab at [facility].

## 2018-05-30 ENCOUNTER — Encounter (HOSPITAL_COMMUNITY): Payer: Non-veteran care

## 2018-05-30 ENCOUNTER — Encounter (HOSPITAL_COMMUNITY)
Admission: RE | Admit: 2018-05-30 | Discharge: 2018-05-30 | Disposition: A | Discharge status: Home / Self Care | Payer: No Typology Code available for payment source | Source: Ambulatory Visit | Attending: Pulmonary Disease | Admitting: Pulmonary Disease

## 2018-05-30 DIAGNOSIS — J438 Other emphysema: Secondary | ICD-10-CM

## 2018-05-30 DIAGNOSIS — J439 Emphysema, unspecified: Secondary | ICD-10-CM | POA: Diagnosis not present

## 2018-05-30 NOTE — Progress Notes (Signed)
Daily Session Note  Patient Details  Name: Michael W Frasco Jr. MRN: [id number] Date of Birth: 11/16/1945 Referring Provider:     Pulmonary Rehab Walk Test from 04/04/2018 in [facility]  Referring Provider  Dr. Yacoub      Encounter Date: 05/30/2018  Check In: Session Check In - 05/30/18 1030      Check-In   Supervising physician immediately available to respond to emergencies  [facility] immediately available    Physician(s)  Dr. Alekh    Location  [facility]    Staff Present  Joan Behrens, RN, BSN;Carlette Carlton, RN, BSN;Dalton Fletcher, MS, Exercise Physiologist;Lisa Hughes, RN;Annedrea Stackhouse, RN, MHA    Medication changes reported      No    Fall or balance concerns reported     No    Tobacco Cessation  No Change    Warm-up and Cool-down  Performed as group-led instruction    Resistance Training Performed  Yes    VAD Patient?  No    PAD/SET Patient?  No      Pain Assessment   Currently in Pain?  No/denies    Multiple Pain Sites  No       Capillary Blood Glucose: No results found for this or any previous visit (from the past 24 hour(s)).  Exercise Prescription Changes - 05/30/18 1200      Response to Exercise   Blood Pressure (Admit)  106/54    Blood Pressure (Exercise)  116/60    Blood Pressure (Exit)  118/60    Heart Rate (Admit)  81 bpm    Heart Rate (Exercise)  128 bpm    Heart Rate (Exit)  91 bpm    Oxygen Saturation (Admit)  95 %    Oxygen Saturation (Exercise)  89 %    Oxygen Saturation (Exit)  98 %    Rating of Perceived Exertion (Exercise)  17    Perceived Dyspnea (Exercise)  2    Duration  Progress to 45 minutes of aerobic exercise without signs/symptoms of physical distress    Intensity  THRR unchanged      Resistance Training   Training Prescription  Yes    Weight  orange bands    Reps  10-15    Time  10 Minutes      Oxygen   Oxygen  Continuous    Liters  4      Recumbant Bike   Level  4    Watts  10    Minutes  17      NuStep   Level  4    SPM  80    Minutes  17    METs  2.5      Track   Laps  12    Minutes  17       Social History   Tobacco Use  Smoking Status Former Smoker  . Packs/day: 1.75  . Years: 40.00  . Pack years: 70.00  . Types: Cigarettes  . Last attempt to quit: 03/01/2004  . Years since quitting: 14.2  Smokeless Tobacco Never Used    Goals Met:  Proper associated with RPD/PD & O2 Sat Exercise tolerated well  Goals Unmet:  Not Applicable  Comments: Service time is from 1030 to 1200    Dr. Wesam G. Yacoub is Medical Director for Pulmonary Rehab at  Hospital. 

## 2018-05-31 NOTE — Progress Notes (Signed)
Pulmonary Individual Treatment Plan  Patient Details  Name: Michael Robertson. MRN: [id number] Date of Birth: 23-Sep-1945 Referring Provider:     Pulmonary Rehab Walk Test from 04/04/2018 in [place]  Referring Provider  Dr. Nelda Marseille      Initial Encounter Date:    Pulmonary Rehab Walk Test from 04/04/2018 in [place]  Date  04/04/18      Visit Diagnosis: Other emphysema ([place])  Patient's Home Medications on Admission:   Current Outpatient Medications:  .  azithromycin (ZITHROMAX) 500 MG tablet, TAKE ONE TABLET BY MOUTH ONCE DAILY WHEN  BREATHING  WORSENS (Patient taking differently: 3 (three) times a week. Mondays, Wednesdays and Fridays), Disp: 5 tablet, Rfl: 6 .  bisoprolol (ZEBETA) 5 MG tablet, TAKE 1 TABLET DAILY, Disp: 90 tablet, Rfl: 3 .  cholecalciferol (VITAMIN D) 1000 units tablet, Take 1,000 Units by mouth daily., Disp: , Rfl:  .  fluticasone (FLONASE) 50 MCG/ACT nasal spray, USE 2 SPRAYS NASALLY DAILY, Disp: 32 g, Rfl: 2 .  guaiFENesin-codeine (VIRTUSSIN A/C) 100-10 MG/5ML syrup, TAKE 5ML BY MOUTH TWICE DAILY AS NEEDED FOR COUGH, Disp: 120 mL, Rfl: 2 .  hydrochlorothiazide (HYDRODIURIL) 25 MG tablet, TAKE 1 TABLET DAILY, Disp: 90 tablet, Rfl: 3 .  lisinopril (PRINIVIL,ZESTRIL) 10 MG tablet, TAKE 1 TABLET DAILY, Disp: 90 tablet, Rfl: 3 .  mometasone (ASMANEX) 220 MCG/INH inhaler, Inhale 2 puffs into the lungs at bedtime., Disp: , Rfl:  .  Multiple Vitamins-Minerals (CENTRUM SILVER ADULT 50+ PO), Take by mouth., Disp: , Rfl:  .  omeprazole (PRILOSEC) 40 MG capsule, TAKE 1 CAPSULE DAILY, Disp: 90 capsule, Rfl: 3 .  pravastatin (PRAVACHOL) 40 MG tablet, TAKE 1 TABLET EVERY EVENING, Disp: 90 tablet, Rfl: 3 .  predniSONE (DELTASONE) 20 MG tablet, TAKE ONE TABLET BY MOUTH THREE TIMES DAILY (Patient not taking: Reported on 04/03/2018), Disp: 15 tablet, Rfl: 12 .  sildenafil (VIAGRA) 100 MG tablet, TAKE 1 TABLET AS NEEDED  FOR ERECTILE DYSFUNCTION, Disp: 18 tablet, Rfl: 2 .  tamsulosin (FLOMAX) 0.4 MG CAPS capsule, TAKE 1 CAPSULE DAILY FOR PROSTATE, Disp: 90 capsule, Rfl: 3 .  Tiotropium Bromide-Olodaterol (STIOLTO RESPIMAT) 2.5-2.5 MCG/ACT AERS, Inhale 2 puffs into the lungs daily., Disp: , Rfl:  .  traZODone (DESYREL) 100 MG tablet, TAKE 1 TABLET AT BEDTIME, Disp: 90 tablet, Rfl: 3 .  VENTOLIN HFA 108 (90 Base) MCG/ACT inhaler, USE 2 INHALATIONS EVERY 4 HOURS AS NEEDED. RESCUE FOR SHORTNESS OF BREATH, Disp: 54 g, Rfl: 1  Past Medical History: Past Medical History:  Diagnosis Date  . Chronic kidney disease   . COPD (chronic obstructive pulmonary disease) ([place])   . GERD (gastroesophageal reflux disease)   . Hyperlipidemia   . Hypertension   . Insomnia     Tobacco Use: Social History   Tobacco Use  Smoking Status Former Smoker  . Packs/day: 1.75  . Years: 40.00  . Pack years: 70.00  . Types: Cigarettes  . Last attempt to quit: 03/01/2004  . Years since quitting: 14.2  Smokeless Tobacco Never Used    Labs: Recent Chemical engineer    Labs for ITP Cardiac and Pulmonary Rehab Latest Ref Rng & Units 10/04/2014 10/23/2015 11/17/2016 10/29/2017 04/05/2018   Cholestrol 100 - 199 mg/dL 164 153 183 - -   LDLCALC 0 - 99 mg/dL 83 79 103(H) - -   LDLDIRECT mg/dL - - - - -   HDL >39 mg/dL 42 34(L) 46 - -  Trlycerides 0 - 149 mg/dL 194(H) 202(H) 170(H) - -   Hemoglobin A1c 0.0 - 7.0 % - - - 6.6 5.8   PHART 7.350 - 7.450 - - - - -   PCO2ART 35.0 - 45.0 mmHg - - - - -   HCO3 20.0 - 24.0 mEq/L - - - - -   TCO2 0 - 100 mmol/L - - - - -   O2SAT % - - - - -      Capillary Blood Glucose: No results found for: GLUCAP   Pulmonary Assessment Scores: Pulmonary Assessment Scores    Row Name 04/04/18 1641         ADL UCSD   ADL Phase  Entry       mMRC Score   mMRC Score  3        Pulmonary Function Assessment:   Exercise Target Goals: Exercise Program Goal: Individual exercise prescription set  using results from initial 6 min walk test and THRR while considering  patient's activity barriers and safety.   Exercise Prescription Goal: Initial exercise prescription builds to 30-45 minutes a day of aerobic activity, 2-3 days per week.  Home exercise guidelines will be given to patient during program as part of exercise prescription that the participant will acknowledge.  Activity Barriers & Risk Stratification: Activity Barriers & Cardiac Risk Stratification - 04/03/18 1159      Activity Barriers & Cardiac Risk Stratification   Activity Barriers  History of Falls;Shortness of Breath       6 Minute Walk: 6 Minute Walk    Row Name 04/04/18 1641         6 Minute Walk   Phase  Initial     Distance  800 feet     Walk Time  6 minutes     # of Rest Breaks  0     MPH  1.5     METS  2.15     RPE  13     Perceived Dyspnea   1     Symptoms  Yes (comment)     Comments  used wheelchair     Resting HR  81 bpm     Resting BP  128/66     Resting Oxygen Saturation   94 %     Exercise Oxygen Saturation  during 6 min walk  86 %     Max Ex. HR  121 bpm     Max Ex. BP  148/80       Interval HR   1 Minute HR  84     2 Minute HR  88     3 Minute HR  88     4 Minute HR  110     5 Minute HR  121     6 Minute HR  121     2 Minute Post HR  113     Interval Heart Rate?  Yes       Interval Oxygen   Interval Oxygen?  Yes     Baseline Oxygen Saturation %  94 %     1 Minute Oxygen Saturation %  94 %     1 Minute Liters of Oxygen  2 L     2 Minute Oxygen Saturation %  91 %     2 Minute Liters of Oxygen  2 L     3 Minute Oxygen Saturation %  91 %     3 Minute Liters of Oxygen  2 L     4 Minute Oxygen Saturation %  86 %     4 Minute Liters of Oxygen  2 L     5 Minute Oxygen Saturation %  86 %     5 Minute Liters of Oxygen  4 L     6 Minute Oxygen Saturation %  86 %     6 Minute Liters of Oxygen  4 L     2 Minute Post Oxygen Saturation %  90 %     2 Minute Post Liters of Oxygen  4 L         Oxygen Initial Assessment: Oxygen Initial Assessment - 04/04/18 1640      Home Oxygen   Home Oxygen Device  Portable Concentrator    Sleep Oxygen Prescription  Continuous    Liters per minute  2    Home Exercise Oxygen Prescription  Continuous    Liters per minute  2    Home at Rest Exercise Oxygen Prescription  Continuous    Liters per minute  2    Compliance with Home Oxygen Use  Yes      Initial 6 min Walk   Oxygen Used  Continuous;E-Tanks    Liters per minute  4      Program Oxygen Prescription   Program Oxygen Prescription  Continuous;E-Tanks    Liters per minute  4      Intervention   Short Term Goals  To learn and exhibit compliance with exercise, home and travel O2 prescription;To learn and understand importance of maintaining oxygen saturations>88%;To learn and demonstrate proper use of respiratory medications;To learn and understand importance of monitoring SPO2 with pulse oximeter and demonstrate accurate use of the pulse oximeter.;To learn and demonstrate proper pursed lip breathing techniques or other breathing techniques.    Long  Term Goals  Exhibits compliance with exercise, home and travel O2 prescription;Verbalizes importance of monitoring SPO2 with pulse oximeter and return demonstration;Maintenance of O2 saturations>88%;Exhibits proper breathing techniques, such as pursed lip breathing or other method taught during program session;Compliance with respiratory medication;Demonstrates proper use of MDI's       Oxygen Re-Evaluation: Oxygen Re-Evaluation    Row Name 05/02/18 1555 05/31/18 0753           Program Oxygen Prescription   Program Oxygen Prescription  Continuous;E-Tanks  Continuous;E-Tanks      Liters per minute  4  4        Home Oxygen   Home Oxygen Device  Portable Concentrator  Portable Concentrator      Sleep Oxygen Prescription  Continuous  Continuous      Liters per minute  2  2      Home Exercise Oxygen Prescription  Continuous   Pulsed      Liters per minute  2  5      Home at Rest Exercise Oxygen Prescription  Continuous  Pulsed      Liters per minute  2  2      Compliance with Home Oxygen Use  Yes  Yes        Goals/Expected Outcomes   Short Term Goals  To learn and exhibit compliance with exercise, home and travel O2 prescription;To learn and understand importance of maintaining oxygen saturations>88%;To learn and demonstrate proper use of respiratory medications;To learn and understand importance of monitoring SPO2 with pulse oximeter and demonstrate accurate use of the pulse oximeter.;To learn and demonstrate proper pursed lip breathing techniques or other breathing techniques.  To learn and exhibit compliance with exercise, home and travel O2 prescription;To learn and understand importance of maintaining oxygen saturations>88%;To learn and demonstrate proper use of respiratory medications;To learn and understand importance of monitoring SPO2 with pulse oximeter and demonstrate accurate use of the pulse oximeter.;To learn and demonstrate proper pursed lip breathing techniques or other breathing techniques.      Long  Term Goals  Exhibits compliance with exercise, home and travel O2 prescription;Verbalizes importance of monitoring SPO2 with pulse oximeter and return demonstration;Maintenance of O2 saturations>88%;Exhibits proper breathing techniques, such as pursed lip breathing or other method taught during program session;Compliance with respiratory medication;Demonstrates proper use of MDI's  Exhibits compliance with exercise, home and travel O2 prescription;Verbalizes importance of monitoring SPO2 with pulse oximeter and return demonstration;Maintenance of O2 saturations>88%;Exhibits proper breathing techniques, such as pursed lip breathing or other method taught during program session;Compliance with respiratory medication;Demonstrates proper use of MDI's      Goals/Expected Outcomes  -  compliance          Oxygen  Discharge (Final Oxygen Re-Evaluation): Oxygen Re-Evaluation - 05/31/18 0753      Program Oxygen Prescription   Program Oxygen Prescription  Continuous;E-Tanks    Liters per minute  4      Home Oxygen   Home Oxygen Device  Portable Concentrator    Sleep Oxygen Prescription  Continuous    Liters per minute  2    Home Exercise Oxygen Prescription  Pulsed    Liters per minute  5    Home at Rest Exercise Oxygen Prescription  Pulsed    Liters per minute  2    Compliance with Home Oxygen Use  Yes      Goals/Expected Outcomes   Short Term Goals  To learn and exhibit compliance with exercise, home and travel O2 prescription;To learn and understand importance of maintaining oxygen saturations>88%;To learn and demonstrate proper use of respiratory medications;To learn and understand importance of monitoring SPO2 with pulse oximeter and demonstrate accurate use of the pulse oximeter.;To learn and demonstrate proper pursed lip breathing techniques or other breathing techniques.    Long  Term Goals  Exhibits compliance with exercise, home and travel O2 prescription;Verbalizes importance of monitoring SPO2 with pulse oximeter and return demonstration;Maintenance of O2 saturations>88%;Exhibits proper breathing techniques, such as pursed lip breathing or other method taught during program session;Compliance with respiratory medication;Demonstrates proper use of MDI's    Goals/Expected Outcomes  compliance        Initial Exercise Prescription: Initial Exercise Prescription - 04/04/18 1600      Date of Initial Exercise RX and Referring Provider   Date  04/04/18    Referring Provider  Dr. Nelda Marseille      Oxygen   Oxygen  Continuous    Liters  4      Recumbant Bike   Level  2    Watts  10    Minutes  17      NuStep   Level  2    SPM  80    Minutes  17    METs  1.5      Track   Laps  5    Minutes  17      Prescription Details   Frequency (times per week)  2    Duration  Progress to 45  minutes of aerobic exercise without signs/symptoms of physical distress      Intensity   THRR 40-80% of Max Heartrate  59-118    Ratings of  Perceived Exertion  11-13    Perceived Dyspnea  0-4      Progression   Progression  Continue to progress workloads to maintain intensity without signs/symptoms of physical distress.      Resistance Training   Training Prescription  Yes    Weight  ORANGE BANDS    Reps  10-15       Perform Capillary Blood Glucose checks as needed.  Exercise Prescription Changes: Exercise Prescription Changes    Row Name 04/18/18 1300 05/02/18 1200 05/04/18 1200 05/16/18 1200 05/30/18 1200     Response to Exercise   Blood Pressure (Admit)  120/60  102/56  -  102/64  106/54   Blood Pressure (Exercise)  160/80  138/72  -  126/70  116/60   Blood Pressure (Exit)  106/72  108/56  -  100/60  118/60   Heart Rate (Admit)  64 bpm  88 bpm  -  73 bpm  81 bpm   Heart Rate (Exercise)  106 bpm  127 bpm  -  124 bpm  128 bpm   Heart Rate (Exit)  76 bpm  73 bpm  -  96 bpm  91 bpm   Oxygen Saturation (Admit)  99 %  97 %  -  97 %  95 %   Oxygen Saturation (Exercise)  83 %  89 %  -  91 %  89 %   Oxygen Saturation (Exit)  98 %  94 %  -  98 %  98 %   Rating of Perceived Exertion (Exercise)  12  14  -  15  17   Perceived Dyspnea (Exercise)  1  3  -  2  2   Duration  Continue with 45 min of aerobic exercise without signs/symptoms of physical distress.  Continue with 45 min of aerobic exercise without signs/symptoms of physical distress.  -  Progress to 45 minutes of aerobic exercise without signs/symptoms of physical distress  Progress to 45 minutes of aerobic exercise without signs/symptoms of physical distress   Intensity  THRR unchanged  THRR unchanged  -  THRR unchanged  THRR unchanged     Resistance Training   Training Prescription  Yes  Yes  -  Yes  Yes   Weight  ORANGE BANDS  ORANGE BANDS  -  orange bands  orange bands   Reps  10-15  10-15  -  10-15  10-15   Time  -  -  -   10 Minutes  10 Minutes     Oxygen   Oxygen  Continuous  Continuous  -  Continuous  Continuous   Liters  4  4  -  4  4     Recumbant Bike   Level  2  3  -  3  4   Watts  10  10  -  10  10   Minutes  17  17  -  17  17     NuStep   Level  2  3  -  4  4   SPM  80  80  -  80  80   Minutes  17  17  -  17  17   METs  2.3  3  -  3.1  2.5     Track   Laps  8  14  -  12  12   Minutes  17  17  -  17  17  Home Exercise Plan   Plans to continue exercise at  -  -  Home (comment)  -  -   Frequency  -  -  Add 2 additional days to program exercise sessions.  -  -      Exercise Comments: Exercise Comments    Row Name 05/04/18 1215           Exercise Comments  home exercise completed           Exercise Goals and Review: Exercise Goals    Row Name 04/03/18 1041             Exercise Goals   Increase Physical Activity  Yes       Intervention  Provide advice, education, support and counseling about physical activity/exercise needs.;Develop an individualized exercise prescription for aerobic and resistive training based on initial evaluation findings, risk stratification, comorbidities and participant's personal goals.       Expected Outcomes  Short Term: Attend rehab on a regular basis to increase amount of physical activity.;Long Term: Exercising regularly at least 3-5 days a week.;Long Term: Add in home exercise to make exercise part of routine and to increase amount of physical activity.       Increase Strength and Stamina  Yes       Intervention  Provide advice, education, support and counseling about physical activity/exercise needs.;Develop an individualized exercise prescription for aerobic and resistive training based on initial evaluation findings, risk stratification, comorbidities and participant's personal goals.       Expected Outcomes  Short Term: Increase workloads from initial exercise prescription for resistance, speed, and METs.;Short Term: Perform resistance  training exercises routinely during rehab and add in resistance training at home;Long Term: Improve cardiorespiratory fitness, muscular endurance and strength as measured by increased METs and functional capacity (6MWT)       Able to understand and use rate of perceived exertion (RPE) scale  Yes       Intervention  Provide education and explanation on how to use RPE scale       Expected Outcomes  Short Term: Able to use RPE daily in rehab to express subjective intensity level;Long Term:  Able to use RPE to guide intensity level when exercising independently       Able to understand and use Dyspnea scale  Yes       Intervention  Provide education and explanation on how to use Dyspnea scale       Expected Outcomes  Short Term: Able to use Dyspnea scale daily in rehab to express subjective sense of shortness of breath during exertion;Long Term: Able to use Dyspnea scale to guide intensity level when exercising independently       Knowledge and understanding of Target Heart Rate Range (THRR)  Yes       Intervention  Provide education and explanation of THRR including how the numbers were predicted and where they are located for reference       Expected Outcomes  Short Term: Able to state/look up THRR;Long Term: Able to use THRR to govern intensity when exercising independently;Short Term: Able to use daily as guideline for intensity in rehab       Understanding of Exercise Prescription  Yes       Intervention  Provide education, explanation, and written materials on patient's individual exercise prescription       Expected Outcomes  Short Term: Able to explain program exercise prescription;Long Term: Able to explain home exercise prescription to exercise independently  Exercise Goals Re-Evaluation : Exercise Goals Re-Evaluation    Row Name 05/02/18 1555 05/31/18 0754           Exercise Goal Re-Evaluation   Exercise Goals Review  Increase Physical Activity;Increase Strength and  Stamina;Able to understand and use rate of perceived exertion (RPE) scale;Able to understand and use Dyspnea scale;Knowledge and understanding of Target Heart Rate Range (THRR);Understanding of Exercise Prescription  Increase Physical Activity;Increase Strength and Stamina;Able to understand and use rate of perceived exertion (RPE) scale;Able to understand and use Dyspnea scale;Knowledge and understanding of Target Heart Rate Range (THRR);Understanding of Exercise Prescription      Comments  Patient has only completed 5 rehab sessions. He is progressing well and seems motivated to make workload changes. He is able to walk 14 laps (200 ft each) in 15 minutes. Will cont to monitor and progress patient as able.  Pt has progressed well and is motivated and open to workload increases. Pt is exercising at low to moderate MET levels (2.6-3.1). Pt is walking regularly at home as well. Pt continues to walk 12 laps around the track (1 lap=200 ft) in 15 minutes. Will continue to monitor and progress as able.       Expected Outcomes  Through exercise at rehab and at home, patient will increase functional ability, have a decrease in sob with ADL's, and establish an exercise regimine at home.  Through exercise at rehab and at home, patient will increase functional ability, have a decrease in sob with ADL's, and establish an exercise regimine at home.         Discharge Exercise Prescription (Final Exercise Prescription Changes): Exercise Prescription Changes - 05/30/18 1200      Response to Exercise   Blood Pressure (Admit)  106/54    Blood Pressure (Exercise)  116/60    Blood Pressure (Exit)  118/60    Heart Rate (Admit)  81 bpm    Heart Rate (Exercise)  128 bpm    Heart Rate (Exit)  91 bpm    Oxygen Saturation (Admit)  95 %    Oxygen Saturation (Exercise)  89 %    Oxygen Saturation (Exit)  98 %    Rating of Perceived Exertion (Exercise)  17    Perceived Dyspnea (Exercise)  2    Duration  Progress to 45  minutes of aerobic exercise without signs/symptoms of physical distress    Intensity  THRR unchanged      Resistance Training   Training Prescription  Yes    Weight  orange bands    Reps  10-15    Time  10 Minutes      Oxygen   Oxygen  Continuous    Liters  4      Recumbant Bike   Level  4    Watts  10    Minutes  17      NuStep   Level  4    SPM  80    Minutes  17    METs  2.5      Track   Laps  12    Minutes  17       Nutrition:  Target Goals: Understanding of nutrition guidelines, daily intake of sodium <1556m, cholesterol <2081m calories 30% from fat and 7% or less from saturated fats, daily to have 5 or more servings of fruits and vegetables.  Biometrics:    Nutrition Therapy Plan and Nutrition Goals: Nutrition Therapy & Goals - 05/11/18 1439  Nutrition Therapy   Diet  general healthful, low sodium      Personal Nutrition Goals   Nutrition Goal  Pt to identify and limit food sources of sodium    Personal Goal #2  Pt able to name foods that affect blood glucose       Intervention Plan   Intervention  Prescribe, educate and counsel regarding individualized specific dietary modifications aiming towards targeted core components such as weight, hypertension, lipid management, diabetes, heart failure and other comorbidities.    Expected Outcomes  Short Term Goal: Understand basic principles of dietary content, such as calories, fat, sodium, cholesterol and nutrients.;Long Term Goal: Adherence to prescribed nutrition plan.       Nutrition Assessments: Nutrition Assessments - 04/03/18 1447      Rate Your Plate Scores   Pre Score  47       Nutrition Goals Re-Evaluation:   Nutrition Goals Discharge (Final Nutrition Goals Re-Evaluation):   Psychosocial: Target Goals: Acknowledge presence or absence of significant depression and/or stress, maximize coping skills, provide positive support system. Participant is able to verbalize types and ability to  use techniques and skills needed for reducing stress and depression.  Initial Review & Psychosocial Screening: Initial Psych Review & Screening - 04/03/18 1046      Initial Review   Current issues with  Current Anxiety/Panic      Family Dynamics   Good Support System?  Yes    Comments  anxious about getting pneumonia so he stays inside for the winter months - grey days he feels more depressed.  happily married for 6 years      Barriers   Psychosocial barriers to participate in program  The patient should benefit from training in stress management and relaxation.      Screening Interventions   Interventions  Encouraged to exercise    Expected Outcomes  Long Term goal: The participant improves quality of Life and PHQ9 Scores as seen by post scores and/or verbalization of changes;Short Term goal: Identification and review with participant of any Quality of Life or Depression concerns found by scoring the questionnaire.       Quality of Life Scores:  Scores of 19 and below usually indicate a poorer quality of life in these areas.  A difference of  2-3 points is a clinically meaningful difference.  A difference of 2-3 points in the total score of the Quality of Life Index has been associated with significant improvement in overall quality of life, self-image, physical symptoms, and general health in studies assessing change in quality of life.  PHQ-9: Recent Review Flowsheet Data    Depression screen [facility] 2/9 04/05/2018 04/03/2018 11/23/2017 11/23/2017 04/14/2017   Decreased Interest 0 0 0 0 0   Down, Depressed, Hopeless 0 1 0 0 0   PHQ - 2 Score 0 1 0 0 0   Altered sleeping - 3 - - -   Tired, decreased energy - 3  - - -   Change in appetite - 0 - - -   Feeling bad or failure about yourself  - 1 - - -   Trouble concentrating - 0 - - -   Moving slowly or fidgety/restless - 0 - - -   Suicidal thoughts - 0 - - -   PHQ-9 Score - 8 - - -   Difficult doing work/chores - Not difficult at all - - -      Interpretation of Total Score  Total Score Depression Severity:  1-4 = Minimal depression, 5-9 = Mild depression, 10-14 = Moderate depression, 15-19 = Moderately severe depression, 20-27 = Severe depression   Psychosocial Evaluation and Intervention: Psychosocial Evaluation - 05/31/18 0926      Psychosocial Evaluation & Interventions   Interventions  Encouraged to exercise with the program and follow exercise prescription;Relaxation education;Stress management education    Comments  no immediate barriers to participating in pulmonary rehab at this time due to the fall season weather may become a factor.  Pt is hesitant to venture out in the community for fear of getting sick.    Expected Outcomes  Pt will feel confident in the cold weather months and report less fear/anxiety regarding the possibility of getting cold    Continue Psychosocial Services   Follow up required by staff       Psychosocial Re-Evaluation: Psychosocial Re-Evaluation    [place] Name 05/03/18 1206 05/03/18 1207 05/31/18 3474         Psychosocial Re-Evaluation   Current issues with  Current Anxiety/Panic  -  Current Anxiety/Panic     Comments  -  For right now, pt does not have any anxiety regarding getting Pnumonia due to the fall season  For right now, pt does not have any anxiety regarding getting Pnumonia due to the fall season however during the next 30 days weather may become a factor. Reinforce sickness prevention tatics such as handwashing, mask  and not knowingly be around someone who is sick      Expected Outcomes  -  Pt will report decreased anxiety regarding fear of having Pneumonia during the winte months  Pt will report decreased anxiety regarding fear of having Pneumonia during the winte months     Interventions  -  Stress management education;Relaxation education;Encouraged to attend Pulmonary Rehabilitation for the exercise  Stress management education;Relaxation education;Encouraged to attend  Pulmonary Rehabilitation for the exercise     Continue Psychosocial Services   -  Follow up required by staff  Follow up required by staff        Psychosocial Discharge (Final Psychosocial Re-Evaluation): Psychosocial Re-Evaluation - 05/31/18 0927      Psychosocial Re-Evaluation   Current issues with  Current Anxiety/Panic    Comments  For right now, pt does not have any anxiety regarding getting Pnumonia due to the fall season however during the next 30 days weather may become a factor. Reinforce sickness prevention tatics such as handwashing, mask  and not knowingly be around someone who is sick     Expected Outcomes  Pt will report decreased anxiety regarding fear of having Pneumonia during the winte months    Interventions  Stress management education;Relaxation education;Encouraged to attend Pulmonary Rehabilitation for the exercise    Continue Psychosocial Services   Follow up required by staff       Education: Education Goals: Education classes will be provided on a weekly basis, covering required topics. Participant will state understanding/return demonstration of topics presented.  Learning Barriers/Preferences: Learning Barriers/Preferences - 04/03/18 1052      Learning Barriers/Preferences   Learning Barriers  Sight;Hearing    Learning Preferences  Skilled Demonstration;Group Instruction;Individual Instruction;Audio;Verbal Instruction       Education Topics: Risk Factor Reduction:  -Group instruction that is supported by a PowerPoint presentation. Instructor discusses the definition of a risk factor, different risk factors for pulmonary disease, and how the heart and lungs work together.     Nutrition for Pulmonary Patient:  -Group instruction provided by PowerPoint slides, verbal discussion,  and written materials to support subject matter. The instructor gives an explanation and review of healthy diet recommendations, which includes a discussion on weight management,  recommendations for fruit and vegetable consumption, as well as protein, fluid, caffeine, fiber, sodium, sugar, and alcohol. Tips for eating when patients are short of breath are discussed.   Pursed Lip Breathing:  -Group instruction that is supported by demonstration and informational handouts. Instructor discusses the benefits of pursed lip and diaphragmatic breathing and detailed demonstration on how to preform both.     Oxygen Safety:  -Group instruction provided by PowerPoint, verbal discussion, and written material to support subject matter. There is an overview of "What is Oxygen" and "Why do we need it".  Instructor also reviews how to create a safe environment for oxygen use, the importance of using oxygen as prescribed, and the risks of noncompliance. There is a brief discussion on traveling with oxygen and resources the patient may utilize.   PULMONARY REHAB CHRONIC OBSTRUCTIVE PULMONARY DISEASE from 05/25/2018 in [place]  Date  04/20/18  Educator  Cloyde Reams  Instruction Review Code  1- Verbalizes Understanding      Oxygen Equipment:  -Group instruction provided by [employer] utilizing handouts, written materials, and Insurance underwriter.   PULMONARY REHAB CHRONIC OBSTRUCTIVE PULMONARY DISEASE from 05/25/2018 in [place]  Date  04/27/18  Educator  Ace Gins  Instruction Review Code  1- Verbalizes Understanding      Signs and Symptoms:  -Group instruction provided by written material and verbal discussion to support subject matter. Warning signs and symptoms of infection, stroke, and heart attack are reviewed and when to call the physician/911 reinforced. Tips for preventing the spread of infection discussed.   Advanced Directives:  -Group instruction provided by verbal instruction and written material to support subject matter. Instructor reviews Advanced Directive laws and proper instruction for filling  out document.   Pulmonary Video:  -Group video education that reviews the importance of medication and oxygen compliance, exercise, good nutrition, pulmonary hygiene, and pursed lip and diaphragmatic breathing for the pulmonary patient.   Exercise for the Pulmonary Patient:  -Group instruction that is supported by a PowerPoint presentation. Instructor discusses benefits of exercise, core components of exercise, frequency, duration, and intensity of an exercise routine, importance of utilizing pulse oximetry during exercise, safety while exercising, and options of places to exercise outside of rehab.     PULMONARY REHAB CHRONIC OBSTRUCTIVE PULMONARY DISEASE from 05/25/2018 in [place]  Date  05/04/18  Educator  EP  Instruction Review Code  1- Verbalizes Understanding      Pulmonary Medications:  -Verbally interactive group education provided by instructor with focus on inhaled medications and proper administration.   Anatomy and Physiology of the Respiratory System and Intimacy:  -Group instruction provided by PowerPoint, verbal discussion, and written material to support subject matter. Instructor reviews respiratory cycle and anatomical components of the respiratory system and their functions. Instructor also reviews differences in obstructive and restrictive respiratory diseases with examples of each. Intimacy, Sex, and Sexuality differences are reviewed with a discussion on how relationships can change when diagnosed with pulmonary disease. Common sexual concerns are reviewed.   PULMONARY REHAB CHRONIC OBSTRUCTIVE PULMONARY DISEASE from 05/25/2018 in [place]  Date  05/25/18  Educator  RN  Instruction Review Code  2- Demonstrated Understanding      MD DAY -A group question and answer session with  a medical doctor that allows participants to ask questions that relate to their pulmonary disease state.   PULMONARY REHAB  CHRONIC OBSTRUCTIVE PULMONARY DISEASE from 05/25/2018 in [place]  Date  05/11/18  Educator  yacoub  Instruction Review Code  1- Verbalizes Understanding      OTHER EDUCATION -Group or individual verbal, written, or video instructions that support the educational goals of the pulmonary rehab program.   Holiday Eating Survival Tips:  -Group instruction provided by PowerPoint slides, verbal discussion, and written materials to support subject matter. The instructor gives patients tips, tricks, and techniques to help them not only survive but enjoy the holidays despite the onslaught of food that accompanies the holidays.   Knowledge Questionnaire Score:   Core Components/Risk Factors/Patient Goals at Admission: Personal Goals and Risk Factors at Admission - 04/03/18 1053      Core Components/Risk Factors/Patient Goals on Admission    Weight Management  Weight Loss;Yes    Intervention  Weight Management/Obesity: Establish reasonable short term and long term weight goals.;Weight Management: Provide education and appropriate resources to help participant work on and attain dietary goals.;Obesity: Provide education and appropriate resources to help participant work on and attain dietary goals.;Weight Management: Develop a combined nutrition and exercise program designed to reach desired caloric intake, while maintaining appropriate intake of nutrient and fiber, sodium and fats, and appropriate energy expenditure required for the weight goal.    Admit Weight  168 lb 10.4 oz (76.5 kg)    Goal Weight: Short Term  163 lb (73.9 kg)    Goal Weight: Long Term  158 lb (71.7 kg)    Expected Outcomes  Understanding of distribution of calorie intake throughout the day with the consumption of 4-5 meals/snacks;Understanding recommendations for meals to include 15-35% energy as protein, 25-35% energy from fat, 35-60% energy from carbohydrates, less than '200mg'$  of dietary  cholesterol, 20-35 gm of total fiber daily;Weight Loss: Understanding of general recommendations for a balanced deficit meal plan, which promotes 1-2 lb weight loss per week and includes a negative energy balance of [id number] kcal/d;Short Term: Continue to assess and modify interventions until short term weight is achieved;Long Term: Adherence to nutrition and physical activity/exercise program aimed toward attainment of established weight goal    Improve shortness of breath with ADL's  Yes    Intervention  Provide education, individualized exercise plan and daily activity instruction to help decrease symptoms of SOB with activities of daily living.    Expected Outcomes  Short Term: Improve cardiorespiratory fitness to achieve a reduction of symptoms when performing ADLs;Long Term: Be able to perform more ADLs without symptoms or delay the onset of symptoms    Hypertension  Yes    Intervention  Provide education on lifestyle modifcations including regular physical activity/exercise, weight management, moderate sodium restriction and increased consumption of fresh fruit, vegetables, and low fat dairy, alcohol moderation, and smoking cessation.;Monitor prescription use compliance.    Expected Outcomes  Short Term: Continued assessment and intervention until BP is < 140/66m HG in hypertensive participants. < 130/829mHG in hypertensive participants with diabetes, heart failure or chronic kidney disease.;Long Term: Maintenance of blood pressure at goal levels.    Lipids  Yes    Intervention  Provide education and support for participant on nutrition & aerobic/resistive exercise along with prescribed medications to achieve LDL '70mg'$ , HDL >'40mg'$ .    Expected Outcomes  Short Term: Participant states understanding of desired cholesterol values and is compliant with medications prescribed. Participant  is following exercise prescription and nutrition guidelines.;Long Term: Cholesterol controlled with medications as  prescribed, with individualized exercise RX and with personalized nutrition plan. Value goals: LDL < 30m, HDL > 40 mg.       Core Components/Risk Factors/Patient Goals Review:  Goals and Risk Factor Review    Row Name 05/03/18 1210 05/03/18 1308 05/31/18 0929         Core Components/Risk Factors/Patient Goals Review   Personal Goals Review  Weight Management/Obesity;Heart Failure;Develop more efficient breathing techniques such as purse lipped breathing and diaphragmatic breathing and practicing self-pacing with activity.;Increase knowledge of respiratory medications and ability to use respiratory devices properly.;Hypertension;Lipids;Improve shortness of breath with ADL's  -  Weight Management/Obesity;Heart Failure;Develop more efficient breathing techniques such as purse lipped breathing and diaphragmatic breathing and practicing self-pacing with activity.;Increase knowledge of respiratory medications and ability to use respiratory devices properly.;Improve shortness of breath with ADL's     Review  Pt is off to a great start.  Pt has completed 5 exercise sessions.  Pt weight remains unchanged from his first exercise session. Pt able to maintain oxygen saturations within rehab protocol with increase of his oxygen from 2L to 4L.  Pt saturation while on the track remains his lowest 88-91%  Pt requires coaching with his PLB techniques. I am hopeful that pt will become independent with continued practice.  Recumbent bike increase to level 3, nustep level 3 and averages 14 laps.  Pt reports stable lipid readings,  Will plan to resolve this goal at next 30 day review. BP readings are within normal limits with the expected increase in bp on exertion.  Continue to monitor pt progress toward his goal during the next 30 day review.   Pt is off to a great start.  Pt has completed 5 exercise sessions and 2 education classess.  Pt weight remains unchanged from his first exercise session. Pt able to maintain oxygen  saturations within rehab protocol with increase of his oxygen from 2L to 4L.  Pt saturation while on the track remains his lowest 88-91%  Pt requires coaching with his PLB techniques. I am hopeful that pt will become independent with continued practice.  Recumbent bike increase to level 3, nustep level 3 and averages 14 laps.  Pt reports stable lipid readings,  Will plan to resolve this goal at next 30 day review. BP readings are within normal limits with the expected increase in bp on exertion.  Continue to monitor pt progress toward his goal during the next 30 day review.     Pt has completed 12 exercise sessions and 4 education classess.  Pt weight with slight increase from his first exercise session - .5 kg  Pt able to maintain oxygen saturations within rehab protocol with increase of his oxygen from 2L to 4L.on exertion.  Pt saturation while on the track remains his lowest 88-91%  Pt requires coaching with his PLB techniques. I am hopeful that pt will become independent with continued practice.  Recumbent bike increase to level increase level 4, nustep increase to level 3 and averages 12-14 laps. Pt understands the importance of heart failure managment ie daily weights, low sodium diet and taking medications as prescribed.  Continue to monitor pt progress toward his goal during the next 30 day review.      Expected Outcomes  See Admission Goals/Oucomes  -  See Admission Goals/Oucomes        Core Components/Risk Factors/Patient Goals at Discharge (Final Review):  Goals and Risk  Factor Review - 05/31/18 0929      Core Components/Risk Factors/Patient Goals Review   Personal Goals Review  Weight Management/Obesity;Heart Failure;Develop more efficient breathing techniques such as purse lipped breathing and diaphragmatic breathing and practicing self-pacing with activity.;Increase knowledge of respiratory medications and ability to use respiratory devices properly.;Improve shortness of breath with ADL's     Review    Pt has completed 12 exercise sessions and 4 education classess.  Pt weight with slight increase from his first exercise session - .5 kg  Pt able to maintain oxygen saturations within rehab protocol with increase of his oxygen from 2L to 4L.on exertion.  Pt saturation while on the track remains his lowest 88-91%  Pt requires coaching with his PLB techniques. I am hopeful that pt will become independent with continued practice.  Recumbent bike increase to level increase level 4, nustep increase to level 3 and averages 12-14 laps. Pt understands the importance of heart failure managment ie daily weights, low sodium diet and taking medications as prescribed.  Continue to monitor pt progress toward his goal during the next 30 day review.     Expected Outcomes  See Admission Goals/Oucomes       ITP Comments: ITP Comments    Row Name 04/03/18 1013 05/03/18 1222 05/31/18 0925       ITP Comments  Dr. Manfred Arch, Medical Director, Pulmonary Rehab  Dr. Manfred Arch, Medical Director, Pulmonary Rehab  Dr. Manfred Arch, Medical Director, Pulmonary Rehab        Comments: Pt has completed 12 exercise sessions..  Continue to monitor his progress during the next 30 day assessment. Cherre Huger, BSN Cardiac and Training and development officer

## 2018-06-01 ENCOUNTER — Encounter (HOSPITAL_COMMUNITY)
Admission: RE | Admit: 2018-06-01 | Discharge: 2018-06-01 | Disposition: A | Discharge status: Home / Self Care | Payer: No Typology Code available for payment source | Source: Ambulatory Visit | Attending: Pulmonary Disease | Admitting: Pulmonary Disease

## 2018-06-01 ENCOUNTER — Encounter (HOSPITAL_COMMUNITY): Payer: Non-veteran care

## 2018-06-01 DIAGNOSIS — J439 Emphysema, unspecified: Secondary | ICD-10-CM | POA: Diagnosis not present

## 2018-06-01 DIAGNOSIS — J438 Other emphysema: Secondary | ICD-10-CM

## 2018-06-01 NOTE — Progress Notes (Signed)
Daily Session Note  Patient Details  Name: Michael Robertson. MRN: [id number] Date of Birth: [birth year] Referring Provider:     Pulmonary Rehab Walk Test from 04/04/2018 in [place]  Referring Provider  Dr. Nelda Marseille      Encounter Date: 06/01/2018  Check In: Session Check In - 06/01/18 1227      Check-In   Supervising physician immediately available to respond to emergencies  [facility] immediately available    Physician(s)  Dr. Maryland Pink    Location  [facility]    Staff Present  Hoy Register, MS, Exercise Physiologist;Carlette Wilber Oliphant, RN, Roque Cash, RN    Medication changes reported      No    Fall or balance concerns reported     No    Tobacco Cessation  No Change    Warm-up and Cool-down  Performed as group-led instruction    Resistance Training Performed  Yes    VAD Patient?  No    PAD/SET Patient?  No      Pain Assessment   Currently in Pain?  No/denies    Pain Score  0-No pain    Multiple Pain Sites  No       Capillary Blood Glucose: No results found for this or any previous visit (from the past 24 hour(s)).    Social History   Tobacco Use  Smoking Status Former Smoker  . Packs/day: 1.75  . Years: 40.00  . Pack years: 70.00  . Types: Cigarettes  . Last attempt to quit: 03/01/2004  . Years since quitting: 14.2  Smokeless Tobacco Never Used    Goals Met:  Proper associated with RPD/PD & O2 Sat Exercise tolerated well  Goals Unmet:  Not Applicable  Comments: Service time is from 1030 to 1205    Dr. Rush Farmer is Medical Director for Pulmonary Rehab at [facility].

## 2018-06-06 ENCOUNTER — Encounter (HOSPITAL_COMMUNITY)
Admission: RE | Admit: 2018-06-06 | Discharge: 2018-06-06 | Disposition: A | Discharge status: Home / Self Care | Payer: No Typology Code available for payment source | Source: Ambulatory Visit | Attending: Pulmonary Disease | Admitting: Pulmonary Disease

## 2018-06-06 ENCOUNTER — Encounter (HOSPITAL_COMMUNITY): Payer: Non-veteran care

## 2018-06-06 DIAGNOSIS — J439 Emphysema, unspecified: Secondary | ICD-10-CM | POA: Diagnosis not present

## 2018-06-06 DIAGNOSIS — J438 Other emphysema: Secondary | ICD-10-CM

## 2018-06-06 NOTE — Progress Notes (Signed)
Daily Session Note  Patient Details  Name: Michael Robertson. MRN: [id number] Date of Birth: Mar 12, 1946 Referring Provider:     Pulmonary Rehab Walk Test from 04/04/2018 in [place]  Referring Provider  Dr. Nelda Marseille      Encounter Date: 06/06/2018  Check In: Session Check In - 06/06/18 1235      Check-In   Supervising physician immediately available to respond to emergencies  [facility] immediately available    Physician(s)  Dr. Maryland Pink    Location  [facility]    Staff Present  Maurice Small, RN, Bjorn Loser, MS, Exercise Physiologist;Lisa Ysidro Evert, RN;Olinty Celesta Aver, MS, ACSM CEP, Exercise Physiologist    Medication changes reported      No    Fall or balance concerns reported     No    Resistance Training Performed  Yes    VAD Patient?  No    PAD/SET Patient?  No      Pain Assessment   Currently in Pain?  No/denies    Pain Score  0-No pain    Multiple Pain Sites  No       Capillary Blood Glucose: No results found for this or any previous visit (from the past 24 hour(s)).    Social History   Tobacco Use  Smoking Status Former Smoker  . Packs/day: 1.75  . Years: 40.00  . Pack years: 70.00  . Types: Cigarettes  . Last attempt to quit: 03/01/2004  . Years since quitting: 14.2  Smokeless Tobacco Never Used    Goals Met:  Proper associated with RPD/PD & O2 Sat Exercise tolerated well  Goals Unmet:  Not Applicable  Comments: Service time is from 1030 to 1210    Dr. Rush Farmer is Medical Director for Pulmonary Rehab at [facility].

## 2018-06-08 ENCOUNTER — Encounter (HOSPITAL_COMMUNITY): Payer: Non-veteran care

## 2018-06-08 ENCOUNTER — Encounter (HOSPITAL_COMMUNITY)
Admission: RE | Admit: 2018-06-08 | Discharge: 2018-06-08 | Disposition: A | Discharge status: Home / Self Care | Payer: No Typology Code available for payment source | Source: Ambulatory Visit | Attending: Pulmonary Disease | Admitting: Pulmonary Disease

## 2018-06-08 DIAGNOSIS — J438 Other emphysema: Secondary | ICD-10-CM

## 2018-06-08 DIAGNOSIS — J439 Emphysema, unspecified: Secondary | ICD-10-CM | POA: Diagnosis not present

## 2018-06-08 NOTE — Progress Notes (Signed)
Daily Session Note  Patient Details  Name: Michael Robertson. MRN: [id number] Date of Birth: [birth year] Referring Provider:     Pulmonary Rehab Walk Test from 04/04/2018 in [place]  Referring Provider  Dr. Nelda Marseille      Encounter Date: 06/08/2018  Check In: Session Check In - 06/08/18 1216      Check-In   Supervising physician immediately available to respond to emergencies  [facility] immediately available    Physician(s)  Dr. Starla Link    Location  [facility]    Staff Present  Maurice Small, RN, Bjorn Loser, MS, Exercise Physiologist;Lisa Ysidro Evert, RN    Medication changes reported      No    Fall or balance concerns reported     No    Tobacco Cessation  No Change    Warm-up and Cool-down  Performed as group-led instruction    Resistance Training Performed  Yes    VAD Patient?  No    PAD/SET Patient?  No      Pain Assessment   Currently in Pain?  No/denies    Pain Score  0-No pain    Multiple Pain Sites  No       Capillary Blood Glucose: No results found for this or any previous visit (from the past 24 hour(s)).    Social History   Tobacco Use  Smoking Status Former Smoker  . Packs/day: 1.75  . Years: 40.00  . Pack years: 70.00  . Types: Cigarettes  . Last attempt to quit: 03/01/2004  . Years since quitting: 14.2  Smokeless Tobacco Never Used    Goals Met:  Proper associated with RPD/PD & O2 Sat Exercise tolerated well  Goals Unmet:  Not Applicable  Comments: Service time is from 1030 to 1200    Dr. Rush Farmer is Medical Director for Pulmonary Rehab at [facility].

## 2018-06-13 ENCOUNTER — Encounter (HOSPITAL_COMMUNITY)
Admission: RE | Admit: 2018-06-13 | Discharge: 2018-06-13 | Disposition: A | Discharge status: Home / Self Care | Payer: No Typology Code available for payment source | Source: Ambulatory Visit | Attending: Pulmonary Disease | Admitting: Pulmonary Disease

## 2018-06-13 ENCOUNTER — Encounter (HOSPITAL_COMMUNITY): Payer: Non-veteran care

## 2018-06-13 DIAGNOSIS — J438 Other emphysema: Secondary | ICD-10-CM

## 2018-06-13 DIAGNOSIS — J439 Emphysema, unspecified: Secondary | ICD-10-CM | POA: Diagnosis not present

## 2018-06-13 NOTE — Progress Notes (Signed)
Daily Session Note  Patient Details  Name: Michael Robertson. MRN: [id number] Date of Birth: 1946-05-01 Referring Provider:     Pulmonary Rehab Walk Test from 04/04/2018 in [place]  Referring Provider  Dr. Nelda Marseille      Encounter Date: 06/13/2018  Check In: Session Check In - 06/13/18 1215      Check-In   Supervising physician immediately available to respond to emergencies  [facility] immediately available    Physician(s)  Dr. Starla Link    Location  [facility]    Staff Present  Maurice Small, RN, Bjorn Loser, MS, Exercise Physiologist;Lisa Ysidro Evert, RN    Medication changes reported      No    Fall or balance concerns reported     No    Tobacco Cessation  No Change    Warm-up and Cool-down  Performed as group-led instruction    Resistance Training Performed  Yes    VAD Patient?  No    PAD/SET Patient?  No      Pain Assessment   Currently in Pain?  No/denies    Pain Score  0-No pain    Multiple Pain Sites  No       Capillary Blood Glucose: No results found for this or any previous visit (from the past 24 hour(s)).  Exercise Prescription Changes - 06/13/18 1300      Response to Exercise   Blood Pressure (Admit)  120/72    Blood Pressure (Exercise)  126/70    Blood Pressure (Exit)  102/67    Heart Rate (Admit)  73 bpm    Heart Rate (Exercise)  141 bpm    Heart Rate (Exit)  86 bpm    Oxygen Saturation (Admit)  95 %    Oxygen Saturation (Exercise)  88 %    Oxygen Saturation (Exit)  97 %    Rating of Perceived Exertion (Exercise)  15    Perceived Dyspnea (Exercise)  3    Duration  Progress to 45 minutes of aerobic exercise without signs/symptoms of physical distress    Intensity  THRR unchanged      Resistance Training   Training Prescription  Yes    Weight  orange bands    Reps  10-15    Time  10 Minutes      Oxygen   Oxygen  Continuous    Liters  4      Recumbant Bike   Level  4    Watts  10     Minutes  17      NuStep   Level  4    SPM  80    Minutes  17    METs  2.7      Track   Laps  16    Minutes  17       Social History   Tobacco Use  Smoking Status Former Smoker  . Packs/day: 1.75  . Years: 40.00  . Pack years: 70.00  . Types: Cigarettes  . Last attempt to quit: 03/01/2004  . Years since quitting: 14.2  Smokeless Tobacco Never Used    Goals Met:  Proper associated with RPD/PD & O2 Sat Exercise tolerated well  Goals Unmet:  Not Applicable  Comments: Service time is from 1030 to 1210    Dr. Rush Farmer is Medical Director for Pulmonary Rehab at [facility].

## 2018-06-20 ENCOUNTER — Encounter (HOSPITAL_COMMUNITY): Payer: No Typology Code available for payment source

## 2018-06-20 ENCOUNTER — Encounter (HOSPITAL_COMMUNITY)
Admission: RE | Admit: 2018-06-20 | Discharge: 2018-06-20 | Disposition: A | Discharge status: Home / Self Care | Payer: No Typology Code available for payment source | Source: Ambulatory Visit | Attending: Pulmonary Disease | Admitting: Pulmonary Disease

## 2018-06-20 DIAGNOSIS — Z87891 Personal history of nicotine dependence: Secondary | ICD-10-CM | POA: Diagnosis not present

## 2018-06-20 DIAGNOSIS — N189 Chronic kidney disease, unspecified: Secondary | ICD-10-CM | POA: Insufficient documentation

## 2018-06-20 DIAGNOSIS — Z79899 Other long term (current) drug therapy: Secondary | ICD-10-CM | POA: Diagnosis not present

## 2018-06-20 DIAGNOSIS — E785 Hyperlipidemia, unspecified: Secondary | ICD-10-CM | POA: Insufficient documentation

## 2018-06-20 DIAGNOSIS — J449 Chronic obstructive pulmonary disease, unspecified: Secondary | ICD-10-CM | POA: Insufficient documentation

## 2018-06-20 DIAGNOSIS — J439 Emphysema, unspecified: Secondary | ICD-10-CM | POA: Diagnosis present

## 2018-06-20 DIAGNOSIS — J438 Other emphysema: Secondary | ICD-10-CM

## 2018-06-20 DIAGNOSIS — I129 Hypertensive chronic kidney disease with stage 1 through stage 4 chronic kidney disease, or unspecified chronic kidney disease: Secondary | ICD-10-CM | POA: Diagnosis not present

## 2018-06-20 NOTE — Progress Notes (Signed)
Daily Session Note  Patient Details  Name: Michael Robertson. MRN: [id number] Date of Birth: Jul 01, 1946 Referring Provider:     Pulmonary Rehab Walk Test from 04/04/2018 in [place]  Referring Provider  Dr. Nelda Marseille      Encounter Date: 06/20/2018  Check In: Session Check In - 06/20/18 1049      Check-In   Supervising physician immediately available to respond to emergencies  [facility] immediately available    Physician(s)  Dr. Bonner Puna    Location  [facility]    Staff Present  Rosebud Poles, RN, BSN;Carlette Wilber Oliphant, RN, Bjorn Loser, MS, Exercise Physiologist;Maram Bently Ysidro Evert, RN    Medication changes reported      No    Fall or balance concerns reported     No    Tobacco Cessation  No Change    Warm-up and Cool-down  Performed as group-led instruction    Resistance Training Performed  Yes    PAD/SET Patient?  No      Pain Assessment   Currently in Pain?  No/denies    Pain Score  0-No pain    Multiple Pain Sites  No       Capillary Blood Glucose: No results found for this or any previous visit (from the past 24 hour(s)).    Social History   Tobacco Use  Smoking Status Former Smoker  . Packs/day: 1.75  . Years: 40.00  . Pack years: 70.00  . Types: Cigarettes  . Last attempt to quit: 03/01/2004  . Years since quitting: 14.3  Smokeless Tobacco Never Used    Goals Met:  Exercise tolerated well No report of cardiac concerns or symptoms Strength training completed today  Goals Unmet:  Not Applicable  Comments: Service time is from 1030 to 1230    Dr. Rush Farmer is Medical Director for Pulmonary Rehab at [facility].

## 2018-06-22 ENCOUNTER — Encounter (HOSPITAL_COMMUNITY): Payer: No Typology Code available for payment source

## 2018-06-27 ENCOUNTER — Encounter (HOSPITAL_COMMUNITY)
Admission: RE | Admit: 2018-06-27 | Discharge: 2018-06-27 | Disposition: A | Discharge status: Home / Self Care | Payer: No Typology Code available for payment source | Source: Ambulatory Visit | Attending: Pulmonary Disease | Admitting: Pulmonary Disease

## 2018-06-27 ENCOUNTER — Encounter (HOSPITAL_COMMUNITY): Payer: No Typology Code available for payment source

## 2018-06-27 VITALS — Wt 165.8 lb

## 2018-06-27 DIAGNOSIS — J439 Emphysema, unspecified: Secondary | ICD-10-CM | POA: Diagnosis not present

## 2018-06-27 DIAGNOSIS — J438 Other emphysema: Secondary | ICD-10-CM

## 2018-06-27 NOTE — Progress Notes (Signed)
Daily Session Note  Patient Details  Name: Michael Robertson. MRN: [id number] Date of Birth: May 30, 1946 Referring Provider:     Pulmonary Rehab Walk Test from 04/04/2018 in [place]  Referring Provider  Dr. Nelda Marseille      Encounter Date: 06/27/2018  Check In: Session Check In - 06/27/18 1030      Check-In   Supervising physician immediately available to respond to emergencies  [facility] immediately available    Physician(s)  Dr. Wyline Copas    Location  [facility]    Staff Present  Rosebud Poles, RN, BSN;Carlette Wilber Oliphant, RN, Bjorn Loser, MS, Exercise Physiologist;Lisa Ysidro Evert, RN    Medication changes reported      No    Fall or balance concerns reported     No    Tobacco Cessation  No Change    Warm-up and Cool-down  Performed as group-led instruction    Resistance Training Performed  Yes    VAD Patient?  No    PAD/SET Patient?  No      Pain Assessment   Currently in Pain?  No/denies    Multiple Pain Sites  No       Capillary Blood Glucose: No results found for this or any previous visit (from the past 24 hour(s)).  Exercise Prescription Changes - 06/27/18 1200      Response to Exercise   Blood Pressure (Admit)  110/60    Blood Pressure (Exercise)  156/70    Blood Pressure (Exit)  108/58    Heart Rate (Admit)  76 bpm    Heart Rate (Exercise)  132 bpm    Heart Rate (Exit)  97 bpm    Oxygen Saturation (Admit)  97 %    Oxygen Saturation (Exercise)  88 %    Oxygen Saturation (Exit)  95 %    Rating of Perceived Exertion (Exercise)  13    Perceived Dyspnea (Exercise)  3    Duration  Progress to 45 minutes of aerobic exercise without signs/symptoms of physical distress    Intensity  THRR unchanged      Resistance Training   Training Prescription  Yes    Weight  orange bands    Reps  10-15    Time  10 Minutes      Oxygen   Oxygen  Continuous    Liters  4      Recumbant Bike   Level  4    Watts  10    Minutes  17      NuStep   Level  4    SPM  80    Minutes  17    METs  2.5      Track   Laps  12    Minutes  17       Social History   Tobacco Use  Smoking Status Former Smoker  . Packs/day: 1.75  . Years: 40.00  . Pack years: 70.00  . Types: Cigarettes  . Last attempt to quit: 03/01/2004  . Years since quitting: 14.3  Smokeless Tobacco Never Used    Goals Met:  Exercise tolerated well  Goals Unmet:  Not Applicable  Comments:    Dr. Rush Farmer is Medical Director for Pulmonary Rehab at [facility].

## 2018-06-28 ENCOUNTER — Encounter (HOSPITAL_COMMUNITY): Payer: Self-pay

## 2018-06-28 NOTE — Progress Notes (Signed)
Pulmonary Individual Treatment Plan  Patient Details  Name: Michael Robertson. MRN: [id number] Date of Birth: 1946/04/10 Referring Provider:     Pulmonary Rehab Walk Test from 04/04/2018 in [facility]  Referring Provider  Dr. Nelda Marseille      Initial Encounter Date:    Pulmonary Rehab Walk Test from 04/04/2018 in [place]  Date  04/04/18      Visit Diagnosis: Other emphysema ([place])  Patient's Home Medications on Admission:   Current Outpatient Medications:  .  azithromycin (ZITHROMAX) 500 MG tablet, TAKE ONE TABLET BY MOUTH ONCE DAILY WHEN  BREATHING  WORSENS (Patient taking differently: 3 (three) times a week. Mondays, Wednesdays and Fridays), Disp: 5 tablet, Rfl: 6 .  bisoprolol (ZEBETA) 5 MG tablet, TAKE 1 TABLET DAILY, Disp: 90 tablet, Rfl: 3 .  cholecalciferol (VITAMIN D) 1000 units tablet, Take 1,000 Units by mouth daily., Disp: , Rfl:  .  fluticasone (FLONASE) 50 MCG/ACT nasal spray, USE 2 SPRAYS NASALLY DAILY, Disp: 32 g, Rfl: 2 .  guaiFENesin-codeine (VIRTUSSIN A/C) 100-10 MG/5ML syrup, TAKE 5ML BY MOUTH TWICE DAILY AS NEEDED FOR COUGH, Disp: 120 mL, Rfl: 2 .  hydrochlorothiazide (HYDRODIURIL) 25 MG tablet, TAKE 1 TABLET DAILY, Disp: 90 tablet, Rfl: 3 .  lisinopril (PRINIVIL,ZESTRIL) 10 MG tablet, TAKE 1 TABLET DAILY, Disp: 90 tablet, Rfl: 3 .  mometasone (ASMANEX) 220 MCG/INH inhaler, Inhale 2 puffs into the lungs at bedtime., Disp: , Rfl:  .  Multiple Vitamins-Minerals (CENTRUM SILVER ADULT 50+ PO), Take by mouth., Disp: , Rfl:  .  omeprazole (PRILOSEC) 40 MG capsule, TAKE 1 CAPSULE DAILY, Disp: 90 capsule, Rfl: 3 .  pravastatin (PRAVACHOL) 40 MG tablet, TAKE 1 TABLET EVERY EVENING, Disp: 90 tablet, Rfl: 3 .  predniSONE (DELTASONE) 20 MG tablet, TAKE ONE TABLET BY MOUTH THREE TIMES DAILY (Patient not taking: Reported on 04/03/2018), Disp: 15 tablet, Rfl: 12 .  sildenafil (VIAGRA) 100 MG tablet, TAKE 1 TABLET AS NEEDED  FOR ERECTILE DYSFUNCTION, Disp: 18 tablet, Rfl: 2 .  tamsulosin (FLOMAX) 0.4 MG CAPS capsule, TAKE 1 CAPSULE DAILY FOR PROSTATE, Disp: 90 capsule, Rfl: 3 .  Tiotropium Bromide-Olodaterol (STIOLTO RESPIMAT) 2.5-2.5 MCG/ACT AERS, Inhale 2 puffs into the lungs daily., Disp: , Rfl:  .  traZODone (DESYREL) 100 MG tablet, TAKE 1 TABLET AT BEDTIME, Disp: 90 tablet, Rfl: 3 .  VENTOLIN HFA 108 (90 Base) MCG/ACT inhaler, USE 2 INHALATIONS EVERY 4 HOURS AS NEEDED. RESCUE FOR SHORTNESS OF BREATH, Disp: 54 g, Rfl: 1  Past Medical History: Past Medical History:  Diagnosis Date  . Chronic kidney disease   . COPD (chronic obstructive pulmonary disease) ([place])   . GERD (gastroesophageal reflux disease)   . Hyperlipidemia   . Hypertension   . Insomnia     Tobacco Use: Social History   Tobacco Use  Smoking Status Former Smoker  . Packs/day: 1.75  . Years: 40.00  . Pack years: 70.00  . Types: Cigarettes  . Last attempt to quit: 03/01/2004  . Years since quitting: 14.3  Smokeless Tobacco Never Used    Labs: Recent Chemical engineer    Labs for ITP Cardiac and Pulmonary Rehab Latest Ref Rng & Units 10/04/2014 10/23/2015 11/17/2016 10/29/2017 04/05/2018   Cholestrol 100 - 199 mg/dL 164 153 183 - -   LDLCALC 0 - 99 mg/dL 83 79 103(H) - -   LDLDIRECT mg/dL - - - - -   HDL >39 mg/dL 42 34(L) 46 - -  Trlycerides 0 - 149 mg/dL 194(H) 202(H) 170(H) - -   Hemoglobin A1c 0.0 - 7.0 % - - - 6.6 5.8   PHART 7.350 - 7.450 - - - - -   PCO2ART 35.0 - 45.0 mmHg - - - - -   HCO3 20.0 - 24.0 mEq/L - - - - -   TCO2 0 - 100 mmol/L - - - - -   O2SAT % - - - - -      Capillary Blood Glucose: No results found for: GLUCAP   Pulmonary Assessment Scores: Pulmonary Assessment Scores    Row Name 04/04/18 1641         ADL UCSD   ADL Phase  Entry       mMRC Score   mMRC Score  3        Pulmonary Function Assessment:   Exercise Target Goals: Exercise Program Goal: Individual exercise prescription set  using results from initial 6 min walk test and THRR while considering  patient's activity barriers and safety.   Exercise Prescription Goal: Initial exercise prescription builds to 30-45 minutes a day of aerobic activity, 2-3 days per week.  Home exercise guidelines will be given to patient during program as part of exercise prescription that the participant will acknowledge.  Activity Barriers & Risk Stratification: Activity Barriers & Cardiac Risk Stratification - 04/03/18 1159      Activity Barriers & Cardiac Risk Stratification   Activity Barriers  History of Falls;Shortness of Breath       6 Minute Walk: 6 Minute Walk    Row Name 04/04/18 1641         6 Minute Walk   Phase  Initial     Distance  800 feet     Walk Time  6 minutes     # of Rest Breaks  0     MPH  1.5     METS  2.15     RPE  13     Perceived Dyspnea   1     Symptoms  Yes (comment)     Comments  used wheelchair     Resting HR  81 bpm     Resting BP  128/66     Resting Oxygen Saturation   94 %     Exercise Oxygen Saturation  during 6 min walk  86 %     Max Ex. HR  121 bpm     Max Ex. BP  148/80       Interval HR   1 Minute HR  84     2 Minute HR  88     3 Minute HR  88     4 Minute HR  110     5 Minute HR  121     6 Minute HR  121     2 Minute Post HR  113     Interval Heart Rate?  Yes       Interval Oxygen   Interval Oxygen?  Yes     Baseline Oxygen Saturation %  94 %     1 Minute Oxygen Saturation %  94 %     1 Minute Liters of Oxygen  2 L     2 Minute Oxygen Saturation %  91 %     2 Minute Liters of Oxygen  2 L     3 Minute Oxygen Saturation %  91 %     3 Minute Liters of Oxygen  2 L     4 Minute Oxygen Saturation %  86 %     4 Minute Liters of Oxygen  2 L     5 Minute Oxygen Saturation %  86 %     5 Minute Liters of Oxygen  4 L     6 Minute Oxygen Saturation %  86 %     6 Minute Liters of Oxygen  4 L     2 Minute Post Oxygen Saturation %  90 %     2 Minute Post Liters of Oxygen  4 L         Oxygen Initial Assessment: Oxygen Initial Assessment - 04/04/18 1640      Home Oxygen   Home Oxygen Device  Portable Concentrator    Sleep Oxygen Prescription  Continuous    Liters per minute  2    Home Exercise Oxygen Prescription  Continuous    Liters per minute  2    Home at Rest Exercise Oxygen Prescription  Continuous    Liters per minute  2    Compliance with Home Oxygen Use  Yes      Initial 6 min Walk   Oxygen Used  Continuous;E-Tanks    Liters per minute  4      Program Oxygen Prescription   Program Oxygen Prescription  Continuous;E-Tanks    Liters per minute  4      Intervention   Short Term Goals  To learn and exhibit compliance with exercise, home and travel O2 prescription;To learn and understand importance of maintaining oxygen saturations>88%;To learn and demonstrate proper use of respiratory medications;To learn and understand importance of monitoring SPO2 with pulse oximeter and demonstrate accurate use of the pulse oximeter.;To learn and demonstrate proper pursed lip breathing techniques or other breathing techniques.    Long  Term Goals  Exhibits compliance with exercise, home and travel O2 prescription;Verbalizes importance of monitoring SPO2 with pulse oximeter and return demonstration;Maintenance of O2 saturations>88%;Exhibits proper breathing techniques, such as pursed lip breathing or other method taught during program session;Compliance with respiratory medication;Demonstrates proper use of MDI's       Oxygen Re-Evaluation: Oxygen Re-Evaluation    Row Name 05/02/18 1555 05/31/18 0753 06/27/18 1552         Program Oxygen Prescription   Program Oxygen Prescription  Continuous;E-Tanks  Continuous;E-Tanks  Continuous;E-Tanks     Liters per minute  '4  4  4       '$ Home Oxygen   Home Oxygen Device  Portable Concentrator  Portable Concentrator  Portable Concentrator     Sleep Oxygen Prescription  Continuous  Continuous  Continuous     Liters per  minute  '2  2  2     '$ Home Exercise Oxygen Prescription  Continuous  Pulsed  Pulsed     Liters per minute  '2  5  5     '$ Home at Rest Exercise Oxygen Prescription  Continuous  Pulsed  Pulsed     Liters per minute  '2  2  2     '$ Compliance with Home Oxygen Use  Yes  Yes  Yes       Goals/Expected Outcomes   Short Term Goals  To learn and exhibit compliance with exercise, home and travel O2 prescription;To learn and understand importance of maintaining oxygen saturations>88%;To learn and demonstrate proper use of respiratory medications;To learn and understand importance of monitoring SPO2 with pulse oximeter and demonstrate accurate use of the pulse oximeter.;To learn and  demonstrate proper pursed lip breathing techniques or other breathing techniques.  To learn and exhibit compliance with exercise, home and travel O2 prescription;To learn and understand importance of maintaining oxygen saturations>88%;To learn and demonstrate proper use of respiratory medications;To learn and understand importance of monitoring SPO2 with pulse oximeter and demonstrate accurate use of the pulse oximeter.;To learn and demonstrate proper pursed lip breathing techniques or other breathing techniques.  To learn and exhibit compliance with exercise, home and travel O2 prescription;To learn and understand importance of maintaining oxygen saturations>88%;To learn and demonstrate proper use of respiratory medications;To learn and understand importance of monitoring SPO2 with pulse oximeter and demonstrate accurate use of the pulse oximeter.;To learn and demonstrate proper pursed lip breathing techniques or other breathing techniques.     Long  Term Goals  Exhibits compliance with exercise, home and travel O2 prescription;Verbalizes importance of monitoring SPO2 with pulse oximeter and return demonstration;Maintenance of O2 saturations>88%;Exhibits proper breathing techniques, such as pursed lip breathing or other method taught during  program session;Compliance with respiratory medication;Demonstrates proper use of MDI's  Exhibits compliance with exercise, home and travel O2 prescription;Verbalizes importance of monitoring SPO2 with pulse oximeter and return demonstration;Maintenance of O2 saturations>88%;Exhibits proper breathing techniques, such as pursed lip breathing or other method taught during program session;Compliance with respiratory medication;Demonstrates proper use of MDI's  Exhibits compliance with exercise, home and travel O2 prescription;Verbalizes importance of monitoring SPO2 with pulse oximeter and return demonstration;Maintenance of O2 saturations>88%;Exhibits proper breathing techniques, such as pursed lip breathing or other method taught during program session;Compliance with respiratory medication;Demonstrates proper use of MDI's     Goals/Expected Outcomes  -  compliance   compliance         Oxygen Discharge (Final Oxygen Re-Evaluation): Oxygen Re-Evaluation - 06/27/18 1552      Program Oxygen Prescription   Program Oxygen Prescription  Continuous;E-Tanks    Liters per minute  4      Home Oxygen   Home Oxygen Device  Portable Concentrator    Sleep Oxygen Prescription  Continuous    Liters per minute  2    Home Exercise Oxygen Prescription  Pulsed    Liters per minute  5    Home at Rest Exercise Oxygen Prescription  Pulsed    Liters per minute  2    Compliance with Home Oxygen Use  Yes      Goals/Expected Outcomes   Short Term Goals  To learn and exhibit compliance with exercise, home and travel O2 prescription;To learn and understand importance of maintaining oxygen saturations>88%;To learn and demonstrate proper use of respiratory medications;To learn and understand importance of monitoring SPO2 with pulse oximeter and demonstrate accurate use of the pulse oximeter.;To learn and demonstrate proper pursed lip breathing techniques or other breathing techniques.    Long  Term Goals  Exhibits  compliance with exercise, home and travel O2 prescription;Verbalizes importance of monitoring SPO2 with pulse oximeter and return demonstration;Maintenance of O2 saturations>88%;Exhibits proper breathing techniques, such as pursed lip breathing or other method taught during program session;Compliance with respiratory medication;Demonstrates proper use of MDI's    Goals/Expected Outcomes  compliance        Initial Exercise Prescription: Initial Exercise Prescription - 04/04/18 1600      Date of Initial Exercise RX and Referring Provider   Date  04/04/18    Referring Provider  Dr. Nelda Marseille      Oxygen   Oxygen  Continuous    Liters  4      Recumbant Bike  Level  2    Watts  10    Minutes  17      NuStep   Level  2    SPM  80    Minutes  17    METs  1.5      Track   Laps  5    Minutes  17      Prescription Details   Frequency (times per week)  2    Duration  Progress to 45 minutes of aerobic exercise without signs/symptoms of physical distress      Intensity   THRR 40-80% of Max Heartrate  59-118    Ratings of Perceived Exertion  11-13    Perceived Dyspnea  0-4      Progression   Progression  Continue to progress workloads to maintain intensity without signs/symptoms of physical distress.      Resistance Training   Training Prescription  Yes    Weight  ORANGE BANDS    Reps  10-15       Perform Capillary Blood Glucose checks as needed.  Exercise Prescription Changes:  Exercise Prescription Changes    Row Name 04/18/18 1300 05/02/18 1200 05/04/18 1200 05/16/18 1200 05/30/18 1200     Response to Exercise   Blood Pressure (Admit)  120/60  102/56  -  102/64  106/54   Blood Pressure (Exercise)  160/80  138/72  -  126/70  116/60   Blood Pressure (Exit)  106/72  108/56  -  100/60  118/60   Heart Rate (Admit)  64 bpm  88 bpm  -  73 bpm  81 bpm   Heart Rate (Exercise)  106 bpm  127 bpm  -  124 bpm  128 bpm   Heart Rate (Exit)  76 bpm  73 bpm  -  96 bpm  91 bpm    Oxygen Saturation (Admit)  99 %  97 %  -  97 %  95 %   Oxygen Saturation (Exercise)  83 %  89 %  -  91 %  89 %   Oxygen Saturation (Exit)  98 %  94 %  -  98 %  98 %   Rating of Perceived Exertion (Exercise)  12  14  -  15  17   Perceived Dyspnea (Exercise)  1  3  -  2  2   Duration  Continue with 45 min of aerobic exercise without signs/symptoms of physical distress.  Continue with 45 min of aerobic exercise without signs/symptoms of physical distress.  -  Progress to 45 minutes of aerobic exercise without signs/symptoms of physical distress  Progress to 45 minutes of aerobic exercise without signs/symptoms of physical distress   Intensity  THRR unchanged  THRR unchanged  -  THRR unchanged  THRR unchanged     Resistance Training   Training Prescription  Yes  Yes  -  Yes  Yes   Weight  ORANGE BANDS  ORANGE BANDS  -  orange bands  orange bands   Reps  10-15  10-15  -  10-15  10-15   Time  -  -  -  10 Minutes  10 Minutes     Oxygen   Oxygen  Continuous  Continuous  -  Continuous  Continuous   Liters  4  4  -  4  4     Recumbant Bike   Level  2  3  -  3  4   Watts  10  10  -  10  10   Minutes  17  17  -  17  17     NuStep   Level  2  3  -  4  4   SPM  80  80  -  80  80   Minutes  17  17  -  17  17   METs  2.3  3  -  3.1  2.5     Track   Laps  8  14  -  12  12   Minutes  17  17  -  17  17     Home Exercise Plan   Plans to continue exercise at  -  -  Home (comment)  -  -   Frequency  -  -  Add 2 additional days to program exercise sessions.  -  -   Row Name 06/13/18 1300 06/27/18 1200           Response to Exercise   Blood Pressure (Admit)  120/72  110/60      Blood Pressure (Exercise)  126/70  156/70      Blood Pressure (Exit)  102/67  108/58      Heart Rate (Admit)  73 bpm  76 bpm      Heart Rate (Exercise)  141 bpm  132 bpm      Heart Rate (Exit)  86 bpm  97 bpm      Oxygen Saturation (Admit)  95 %  97 %      Oxygen Saturation (Exercise)  88 %  88 %      Oxygen  Saturation (Exit)  97 %  95 %      Rating of Perceived Exertion (Exercise)  15  13      Perceived Dyspnea (Exercise)  3  3      Duration  Progress to 45 minutes of aerobic exercise without signs/symptoms of physical distress  Progress to 45 minutes of aerobic exercise without signs/symptoms of physical distress      Intensity  THRR unchanged  THRR unchanged        Resistance Training   Training Prescription  Yes  Yes      Weight  orange bands  orange bands      Reps  10-15  10-15      Time  10 Minutes  10 Minutes        Oxygen   Oxygen  Continuous  Continuous      Liters  4  4        Recumbant Bike   Level  4  4      Watts  10  10      Minutes  17  17        NuStep   Level  4  4      SPM  80  80      Minutes  17  17      METs  2.7  2.5        Track   Laps  16  12      Minutes  17  17         Exercise Comments:  Exercise Comments    Row Name 05/04/18 1215           Exercise Comments  home exercise completed           Exercise Goals and Review:  Exercise Goals  [place]el[place] Name 04/03/18 1041             Exercise Goals   Increase Physical Activity  Yes       Intervention  Provide advice, education, support and counseling about physical activity/exercise needs.;Develop an individualized exercise prescription for aerobic and resistive training based on initial evaluation findings, risk stratification, comorbidities and participant's personal goals.       Expected Outcomes  Short Term: Attend rehab on a regular basis to increase amount of physical activity.;Long Term: Exercising regularly at least 3-5 days a week.;Long Term: Add in home exercise to make exercise part of routine and to increase amount of physical activity.       Increase Strength and Stamina  Yes       Intervention  Provide advice, education, support and counseling about physical activity/exercise needs.;Develop an individualized exercise prescription for aerobic and resistive training based on initial  evaluation findings, risk stratification, comorbidities and participant's personal goals.       Expected Outcomes  Short Term: Increase workloads from initial exercise prescription for resistance, speed, and METs.;Short Term: Perform resistance training exercises routinely during rehab and add in resistance training at home;Long Term: Improve cardiorespiratory fitness, muscular endurance and strength as measured by increased METs and functional capacity (6MWT)       Able to understand and use rate of perceived exertion (RPE) scale  Yes       Intervention  Provide education and explanation on how to use RPE scale       Expected Outcomes  Short Term: Able to use RPE daily in rehab to express subjective intensity level;Long Term:  Able to use RPE to guide intensity level when exercising independently       Able to understand and use Dyspnea scale  Yes       Intervention  Provide education and explanation on how to use Dyspnea scale       Expected Outcomes  Short Term: Able to use Dyspnea scale daily in rehab to express subjective sense of shortness of breath during exertion;Long Term: Able to use Dyspnea scale to guide intensity level when exercising independently       Knowledge and understanding of Target Heart Rate Range (THRR)  Yes       Intervention  Provide education and explanation of THRR including how the numbers were predicted and where they are located for reference       Expected Outcomes  Short Term: Able to state/look up THRR;Long Term: Able to use THRR to govern intensity when exercising independently;Short Term: Able to use daily as guideline for intensity in rehab       Understanding of Exercise Prescription  Yes       Intervention  Provide education, explanation, and written materials on patient's individual exercise prescription       Expected Outcomes  Short Term: Able to explain program exercise prescription;Long Term: Able to explain home exercise prescription to exercise  independently          Exercise Goals Re-Evaluation : Exercise Goals Re-Evaluation    Row Name 05/02/18 1555 05/31/18 0754 06/27/18 1552         Exercise Goal Re-Evaluation   Exercise Goals Review  Increase Physical Activity;Increase Strength and Stamina;Able to understand and use rate of perceived exertion (RPE) scale;Able to understand and use Dyspnea scale;Knowledge and understanding of Target Heart Rate Range (THRR);Understanding of Exercise Prescription  Increase Physical Activity;Increase Strength and Stamina;Able to understand and use rate of perceived  exertion (RPE) scale;Able to understand and use Dyspnea scale;Knowledge and understanding of Target Heart Rate Range (THRR);Understanding of Exercise Prescription  Increase Physical Activity;Increase Strength and Stamina;Able to understand and use rate of perceived exertion (RPE) scale;Able to understand and use Dyspnea scale;Knowledge and understanding of Target Heart Rate Range (THRR);Understanding of Exercise Prescription     Comments  Patient has only completed 5 rehab sessions. He is progressing well and seems motivated to make workload changes. He is able to walk 14 laps (200 ft each) in 15 minutes. Will cont to monitor and progress patient as able.  Pt has progressed well and is motivated and open to workload increases. Pt is exercising at low to moderate MET levels (2.6-3.1). Pt is walking regularly at home as well. Pt continues to walk 12 laps around the track (1 lap=200 ft) in 15 minutes. Will continue to monitor and progress as able.   Pt is continuing to slowly progress. He continues to exercise at low to moderate MET levels (2.6-3.0). He has increased his walking distance on the track to 15 laps (1 lap=200 ft) in 15 minutes. Will continue to monitor and progress as able.      Expected Outcomes  Through exercise at rehab and at home, patient will increase functional ability, have a decrease in sob with ADL's, and establish an exercise  regimine at home.  Through exercise at rehab and at home, patient will increase functional ability, have a decrease in sob with ADL's, and establish an exercise regimine at home.  Through exercise at rehab and at home, patient will increase functional ability, have a decrease in sob with ADL's, and establish an exercise regimine at home.        Discharge Exercise Prescription (Final Exercise Prescription Changes): Exercise Prescription Changes - 06/27/18 1200      Response to Exercise   Blood Pressure (Admit)  110/60    Blood Pressure (Exercise)  156/70    Blood Pressure (Exit)  108/58    Heart Rate (Admit)  76 bpm    Heart Rate (Exercise)  132 bpm    Heart Rate (Exit)  97 bpm    Oxygen Saturation (Admit)  97 %    Oxygen Saturation (Exercise)  88 %    Oxygen Saturation (Exit)  95 %    Rating of Perceived Exertion (Exercise)  13    Perceived Dyspnea (Exercise)  3    Duration  Progress to 45 minutes of aerobic exercise without signs/symptoms of physical distress    Intensity  THRR unchanged      Resistance Training   Training Prescription  Yes    Weight  orange bands    Reps  10-15    Time  10 Minutes      Oxygen   Oxygen  Continuous    Liters  4      Recumbant Bike   Level  4    Watts  10    Minutes  17      NuStep   Level  4    SPM  80    Minutes  17    METs  2.5      Track   Laps  12    Minutes  17       Nutrition:  Target Goals: Understanding of nutrition guidelines, daily intake of sodium '1500mg'$ , cholesterol '200mg'$ , calories 30% from fat and 7% or less from saturated fats, daily to have 5 or more servings of fruits and vegetables.  Biometrics:  Nutrition Therapy Plan and Nutrition Goals: Nutrition Therapy & Goals - 05/11/18 1439      Nutrition Therapy   Diet  general healthful, low sodium      Personal Nutrition Goals   Nutrition Goal  Pt to identify and limit food sources of sodium    Personal Goal #2  Pt able to name foods that affect blood  glucose       Intervention Plan   Intervention  Prescribe, educate and counsel regarding individualized specific dietary modifications aiming towards targeted core components such as weight, hypertension, lipid management, diabetes, heart failure and other comorbidities.    Expected Outcomes  Short Term Goal: Understand basic principles of dietary content, such as calories, fat, sodium, cholesterol and nutrients.;Long Term Goal: Adherence to prescribed nutrition plan.       Nutrition Assessments: Nutrition Assessments - 04/03/18 1447      Rate Your Plate Scores   Pre Score  47       Nutrition Goals Re-Evaluation:   Nutrition Goals Discharge (Final Nutrition Goals Re-Evaluation):   Psychosocial: Target Goals: Acknowledge presence or absence of significant depression and/or stress, maximize coping skills, provide positive support system. Participant is able to verbalize types and ability to use techniques and skills needed for reducing stress and depression.  Initial Review & Psychosocial Screening: Initial Psych Review & Screening - 04/03/18 1046      Initial Review   Current issues with  Current Anxiety/Panic      Family Dynamics   Good Support System?  Yes    Comments  anxious about getting pneumonia so he stays inside for the winter months - grey days he feels more depressed.  happily married for 6 years      Barriers   Psychosocial barriers to participate in program  The patient should benefit from training in stress management and relaxation.      Screening Interventions   Interventions  Encouraged to exercise    Expected Outcomes  Long Term goal: The participant improves quality of Life and PHQ9 Scores as seen by post scores and/or verbalization of changes;Short Term goal: Identification and review with participant of any Quality of Life or Depression concerns found by scoring the questionnaire.       Quality of Life Scores:  Scores of 19 and below usually indicate  a poorer quality of life in these areas.  A difference of  2-3 points is a clinically meaningful difference.  A difference of 2-3 points in the total score of the Quality of Life Index has been associated with significant improvement in overall quality of life, self-image, physical symptoms, and general health in studies assessing change in quality of life.  PHQ-9: Recent Review Flowsheet Data    Depression screen [facility] 2/9 04/05/2018 04/03/2018 11/23/2017 11/23/2017 04/14/2017   Decreased Interest 0 0 0 0 0   Down, Depressed, Hopeless 0 1 0 0 0   PHQ - 2 Score 0 1 0 0 0   Altered sleeping - 3 - - -   Tired, decreased energy - 3  - - -   Change in appetite - 0 - - -   Feeling bad or failure about yourself  - 1 - - -   Trouble concentrating - 0 - - -   Moving slowly or fidgety/restless - 0 - - -   Suicidal thoughts - 0 - - -   PHQ-9 Score - 8 - - -   Difficult doing work/chores - Not difficult at  all - - -     Interpretation of Total Score  Total Score Depression Severity:  1-4 = Minimal depression, 5-9 = Mild depression, 10-14 = Moderate depression, 15-19 = Moderately severe depression, 20-27 = Severe depression   Psychosocial Evaluation and Intervention: Psychosocial Evaluation - 06/28/18 1555      Psychosocial Evaluation & Interventions   Interventions  Encouraged to exercise with the program and follow exercise prescription;Relaxation education;Stress management education    Comments  no immediate barriers to participating in pulmonary rehab.  Pt has maintained good health with no reportable sickness.    Expected Outcomes  Pt will feel confident in the cold weather months and report less fear/anxiety regarding the possibility of getting cold    Continue Psychosocial Services   Follow up required by staff       Psychosocial Re-Evaluation: Psychosocial Re-Evaluation    [place] Name 05/03/18 1206 05/03/18 1207 05/31/18 0927 06/28/18 1556       Psychosocial Re-Evaluation   Current issues  with  Current Anxiety/Panic  -  Current Anxiety/Panic  Current Anxiety/Panic    Comments  -  For right now, pt does not have any anxiety regarding getting [place] due to the fall season  For right now, pt does not have any anxiety regarding getting [place] due to the fall season however during the next 30 days weather may become a factor. Reinforce sickness prevention tatics such as handwashing, mask  and not knowingly be around someone who is sick   For right now, pt does not have any anxiety regarding getting sick due to the fall season however winter temperatures are coming. Reinforce sickness prevention tatics such as handwashing, mask  and not knowingly be around someone who is sick     Expected Outcomes  -  Pt will report decreased anxiety regarding fear of having Pneumonia during the winte months  Pt will report decreased anxiety regarding fear of having Pneumonia during the winte months  Pt will report decreased anxiety regarding fear of having Pneumonia during the winte months    Interventions  -  Stress management education;Relaxation education;Encouraged to attend Pulmonary Rehabilitation for the exercise  Stress management education;Relaxation education;Encouraged to attend Pulmonary Rehabilitation for the exercise  Stress management education;Relaxation education;Encouraged to attend Pulmonary Rehabilitation for the exercise    Continue Psychosocial Services   -  Follow up required by staff  Follow up required by staff  Follow up required by staff       Psychosocial Discharge (Final Psychosocial Re-Evaluation): Psychosocial Re-Evaluation - 06/28/18 1556      Psychosocial Re-Evaluation   Current issues with  Current Anxiety/Panic    Comments  For right now, pt does not have any anxiety regarding getting sick due to the fall season however winter temperatures are coming. Reinforce sickness prevention tatics such as handwashing, mask  and not knowingly be around someone who is sick      Expected Outcomes  Pt will report decreased anxiety regarding fear of having Pneumonia during the winte months    Interventions  Stress management education;Relaxation education;Encouraged to attend Pulmonary Rehabilitation for the exercise    Continue Psychosocial Services   Follow up required by staff       Education: Education Goals: Education classes will be provided on a weekly basis, covering required topics. Participant will state understanding/return demonstration of topics presented.  Learning Barriers/Preferences: Learning Barriers/Preferences - 04/03/18 1052      Learning Barriers/Preferences   Learning Barriers  Sight;Hearing  Learning Preferences  Skilled Demonstration;Group Instruction;Individual Instruction;Audio;Verbal Instruction       Education Topics: Risk Factor Reduction:  -Group instruction that is supported by a PowerPoint presentation. Instructor discusses the definition of a risk factor, different risk factors for pulmonary disease, and how the heart and lungs work together.     Nutrition for Pulmonary Patient:  -Group instruction provided by PowerPoint slides, verbal discussion, and written materials to support subject matter. The instructor gives an explanation and review of healthy diet recommendations, which includes a discussion on weight management, recommendations for fruit and vegetable consumption, as well as protein, fluid, caffeine, fiber, sodium, sugar, and alcohol. Tips for eating when patients are short of breath are discussed.   Pursed Lip Breathing:  -Group instruction that is supported by demonstration and informational handouts. Instructor discusses the benefits of pursed lip and diaphragmatic breathing and detailed demonstration on how to preform both.     Oxygen Safety:  -Group instruction provided by PowerPoint, verbal discussion, and written material to support subject matter. There is an overview of "What is Oxygen" and "Why do we need  it".  Instructor also reviews how to create a safe environment for oxygen use, the importance of using oxygen as prescribed, and the risks of noncompliance. There is a brief discussion on traveling with oxygen and resources the patient may utilize.   PULMONARY REHAB CHRONIC OBSTRUCTIVE PULMONARY DISEASE from 06/08/2018 in [place]  Date  04/20/18  Educator  Cloyde Reams  Instruction Review Code  1- Verbalizes Understanding      Oxygen Equipment:  -Group instruction provided by [employer] utilizing handouts, written materials, and Insurance underwriter.   PULMONARY REHAB CHRONIC OBSTRUCTIVE PULMONARY DISEASE from 06/08/2018 in [place]  Date  04/27/18  Educator  Ace Gins  Instruction Review Code  1- Verbalizes Understanding      Signs and Symptoms:  -Group instruction provided by written material and verbal discussion to support subject matter. Warning signs and symptoms of infection, stroke, and heart attack are reviewed and when to call the physician/911 reinforced. Tips for preventing the spread of infection discussed.   Advanced Directives:  -Group instruction provided by verbal instruction and written material to support subject matter. Instructor reviews Advanced Directive laws and proper instruction for filling out document.   Pulmonary Video:  -Group video education that reviews the importance of medication and oxygen compliance, exercise, good nutrition, pulmonary hygiene, and pursed lip and diaphragmatic breathing for the pulmonary patient.   Exercise for the Pulmonary Patient:  -Group instruction that is supported by a PowerPoint presentation. Instructor discusses benefits of exercise, core components of exercise, frequency, duration, and intensity of an exercise routine, importance of utilizing pulse oximetry during exercise, safety while exercising, and options of places to exercise outside of rehab.      PULMONARY REHAB CHRONIC OBSTRUCTIVE PULMONARY DISEASE from 06/08/2018 in [place]  Date  05/04/18  Educator  EP  Instruction Review Code  1- Verbalizes Understanding      Pulmonary Medications:  -Verbally interactive group education provided by instructor with focus on inhaled medications and proper administration.   PULMONARY REHAB CHRONIC OBSTRUCTIVE PULMONARY DISEASE from 06/20/2018 in [place]  Date  06/20/18  Educator  Anderson Malta  Instruction Review Code  1- Verbalizes Understanding      Anatomy and Physiology of the Respiratory System and Intimacy:  -Group instruction provided by PowerPoint, verbal discussion, and written material  to support subject matter. Instructor reviews respiratory cycle and anatomical components of the respiratory system and their functions. Instructor also reviews differences in obstructive and restrictive respiratory diseases with examples of each. Intimacy, Sex, and Sexuality differences are reviewed with a discussion on how relationships can change when diagnosed with pulmonary disease. Common sexual concerns are reviewed.   PULMONARY REHAB CHRONIC OBSTRUCTIVE PULMONARY DISEASE from 06/08/2018 in [place]  Date  05/25/18  Educator  RN  Instruction Review Code  2- Demonstrated Understanding      MD DAY -A group question and answer session with a medical doctor that allows participants to ask questions that relate to their pulmonary disease state.   PULMONARY REHAB CHRONIC OBSTRUCTIVE PULMONARY DISEASE from 06/08/2018 in [place]  Date  05/11/18  Educator  yacoub  Instruction Review Code  1- Verbalizes Understanding      OTHER EDUCATION -Group or individual verbal, written, or video instructions that support the educational goals of the pulmonary rehab program.   PULMONARY REHAB CHRONIC OBSTRUCTIVE PULMONARY DISEASE  from 06/08/2018 in [place]  Date  06/08/18 [[place]]  Educator  Carlette  Instruction Review Code  1- Verbalizes Understanding      Holiday Eating Survival Tips:  -Group instruction provided by [employer], verbal discussion, and written materials to support subject matter. The instructor gives patients tips, tricks, and techniques to help them not only survive but enjoy the holidays despite the onslaught of food that accompanies the holidays.   PULMONARY REHAB CHRONIC OBSTRUCTIVE PULMONARY DISEASE from 06/08/2018 in [place]  Date  06/01/18  Educator  -- [RD]  Instruction Review Code  1- Verbalizes Understanding      Knowledge Questionnaire Score:   Core Components/Risk Factors/Patient Goals at Admission: Personal Goals and Risk Factors at Admission - 04/03/18 1053      Core Components/Risk Factors/Patient Goals on Admission    Weight Management  Weight Loss;Yes    Intervention  Weight Management/Obesity: Establish reasonable short term and long term weight goals.;Weight Management: Provide education and appropriate resources to help participant work on and attain dietary goals.;Obesity: Provide education and appropriate resources to help participant work on and attain dietary goals.;Weight Management: Develop a combined nutrition and exercise program designed to reach desired caloric intake, while maintaining appropriate intake of nutrient and fiber, sodium and fats, and appropriate energy expenditure required for the weight goal.    Admit Weight  168 lb 10.4 oz (76.5 kg)    Goal Weight: Short Term  163 lb (73.9 kg)    Goal Weight: Long Term  158 lb (71.7 kg)    Expected Outcomes  Understanding of distribution of calorie intake throughout the day with the consumption of 4-5 meals/snacks;Understanding recommendations for meals to include 15-35% energy as protein, 25-35% energy from fat, 35-60% energy from  carbohydrates, less than '200mg'$  of dietary cholesterol, 20-35 gm of total fiber daily;Weight Loss: Understanding of general recommendations for a balanced deficit meal plan, which promotes 1-2 lb weight loss per week and includes a negative energy balance of [phone] kcal/d;Short Term: Continue to assess and modify interventions until short term weight is achieved;Long Term: Adherence to nutrition and physical activity/exercise program aimed toward attainment of established weight goal    Improve shortness of breath with ADL's  Yes    Intervention  Provide education, individualized exercise plan and daily activity instruction to help decrease symptoms of SOB with activities of  daily living.    Expected Outcomes  Short Term: Improve cardiorespiratory fitness to achieve a reduction of symptoms when performing ADLs;Long Term: Be able to perform more ADLs without symptoms or delay the onset of symptoms    Hypertension  Yes    Intervention  Provide education on lifestyle modifcations including regular physical activity/exercise, weight management, moderate sodium restriction and increased consumption of fresh fruit, vegetables, and low fat dairy, alcohol moderation, and smoking cessation.;Monitor prescription use compliance.    Expected Outcomes  Short Term: Continued assessment and intervention until BP is < 140/95m HG in hypertensive participants. < 130/853mHG in hypertensive participants with diabetes, heart failure or chronic kidney disease.;Long Term: Maintenance of blood pressure at goal levels.    Lipids  Yes    Intervention  Provide education and support for participant on nutrition & aerobic/resistive exercise along with prescribed medications to achieve LDL '70mg'$ , HDL >'40mg'$ .    Expected Outcomes  Short Term: Participant states understanding of desired cholesterol values and is compliant with medications prescribed. Participant is following exercise prescription and nutrition guidelines.;Long Term:  Cholesterol controlled with medications as prescribed, with individualized exercise RX and with personalized nutrition plan. Value goals: LDL < '70mg'$ , HDL > 40 mg.       Core Components/Risk Factors/Patient Goals Review:  Goals and Risk Factor Review    Row Name 05/03/18 1210 05/03/18 1308 05/31/18 0929 06/28/18 1557       Core Components/Risk Factors/Patient Goals Review   Personal Goals Review  Weight Management/Obesity;Heart Failure;Develop more efficient breathing techniques such as purse lipped breathing and diaphragmatic breathing and practicing self-pacing with activity.;Increase knowledge of respiratory medications and ability to use respiratory devices properly.;Hypertension;Lipids;Improve shortness of breath with ADL's  -  Weight Management/Obesity;Heart Failure;Develop more efficient breathing techniques such as purse lipped breathing and diaphragmatic breathing and practicing self-pacing with activity.;Increase knowledge of respiratory medications and ability to use respiratory devices properly.;Improve shortness of breath with ADL's  Weight Management/Obesity;Develop more efficient breathing techniques such as purse lipped breathing and diaphragmatic breathing and practicing self-pacing with activity.;Increase knowledge of respiratory medications and ability to use respiratory devices properly.;Improve shortness of breath with ADL's    Review  Pt is off to a great start.  Pt has completed 5 exercise sessions.  Pt weight remains unchanged from his first exercise session. Pt able to maintain oxygen saturations within rehab protocol with increase of his oxygen from 2L to 4L.  Pt saturation while on the track remains his lowest 88-91%  Pt requires coaching with his PLB techniques. I am hopeful that pt will become independent with continued practice.  Recumbent bike increase to level 3, nustep level 3 and averages 14 laps.  Pt reports stable lipid readings,  Will plan to resolve this goal at next  30 day review. BP readings are within normal limits with the expected increase in bp on exertion.  Continue to monitor pt progress toward his goal during the next 30 day review.   Pt is off to a great start.  Pt has completed 5 exercise sessions and 2 education classess.  Pt weight remains unchanged from his first exercise session. Pt able to maintain oxygen saturations within rehab protocol with increase of his oxygen from 2L to 4L.  Pt saturation while on the track remains his lowest 88-91%  Pt requires coaching with his PLB techniques. I am hopeful that pt will become independent with continued practice.  Recumbent bike increase to level 3, nustep level 3 and averages 14 laps.  Pt reports stable lipid readings,  Will plan to resolve this goal at next 30 day review. BP readings are within normal limits with the expected increase in bp on exertion.  Continue to monitor pt progress toward his goal during the next 30 day review.     Pt has completed 12 exercise sessions and 4 education classess.  Pt weight with slight increase from his first exercise session - .5 kg  Pt able to maintain oxygen saturations within rehab protocol with increase of his oxygen from 2L to 4L.on exertion.  Pt saturation while on the track remains his lowest 88-91%  Pt requires coaching with his PLB techniques. I am hopeful that pt will become independent with continued practice.  Recumbent bike increase to level increase level 4, nustep increase to level 3 and averages 12-14 laps. Pt understands the importance of heart failure managment ie daily weights, low sodium diet and taking medications as prescribed.  Continue to monitor pt progress toward his goal during the next 30 day review.     Pt has completed 18 exercise sessions and 7 education classess.  Pt weight remains basically unchanged from the begining.  Pt able to maintain oxygen saturations within rehab protocol with increase of his oxygen from 2L to 4L.on exertion. Noted that he  does have an elevation in his heart rate on exertion.  Plan to ask for increase in target heart rate.   Pt saturation while on the track remains his lowest 88-91%  Pt observed showing independence with employing PLB.  pt is understanding the rationale for pacing his activity.   Recumbent bike maintain at  level 4, nustep increase to level 4 and averages 12-14 laps. Heart failure goal resolved.  Continue to monitor pt progress toward his goal during the next 30 day review.     Expected Outcomes  See Admission Goals/Oucomes  -  See Admission Goals/Oucomes  See Admission Goals/Oucomes       Core Components/Risk Factors/Patient Goals at Discharge (Final Review):  Goals and Risk Factor Review - 06/28/18 1557      Core Components/Risk Factors/Patient Goals Review   Personal Goals Review  Weight Management/Obesity;Develop more efficient breathing techniques such as purse lipped breathing and diaphragmatic breathing and practicing self-pacing with activity.;Increase knowledge of respiratory medications and ability to use respiratory devices properly.;Improve shortness of breath with ADL's    Review    Pt has completed 18 exercise sessions and 7 education classess.  Pt weight remains basically unchanged from the begining.  Pt able to maintain oxygen saturations within rehab protocol with increase of his oxygen from 2L to 4L.on exertion. Noted that he does have an elevation in his heart rate on exertion.  Plan to ask for increase in target heart rate.   Pt saturation while on the track remains his lowest 88-91%  Pt observed showing independence with employing PLB.  pt is understanding the rationale for pacing his activity.   Recumbent bike maintain at  level 4, nustep increase to level 4 and averages 12-14 laps. Heart failure goal resolved.  Continue to monitor pt progress toward his goal during the next 30 day review.     Expected Outcomes  See Admission Goals/Oucomes       ITP Comments: ITP Comments    Row  Name 04/03/18 1013 05/03/18 1222 05/31/18 0925 06/28/18 1555     ITP Comments  Dr. Manfred Arch, Medical Director, Pulmonary Rehab  Dr. Manfred Arch, Medical Director, Pulmonary Rehab  Dr. Manfred Arch,  Medical Director, Pulmonary Rehab  Dr. Manfred Arch, Medical Director, Pulmonary Rehab       Comments:  ITP REVIEW Pt is making expected progress toward personal goals after completing  18 sessions.   Recommend continued exercise, life style modification, education, and utilization of breathing texhniques to increase stamina and strength and decrease shortness of breath with exertion. Cherre Huger, BSN Cardiac and Pulmonary Rehab Nurse Navigator      Psychologist, clinical, BSN Cardiac and Training and development officer

## 2018-06-29 ENCOUNTER — Encounter (HOSPITAL_COMMUNITY): Payer: No Typology Code available for payment source

## 2018-06-29 ENCOUNTER — Encounter (HOSPITAL_COMMUNITY)
Admission: RE | Admit: 2018-06-29 | Discharge: 2018-06-29 | Disposition: A | Discharge status: Home / Self Care | Payer: No Typology Code available for payment source | Source: Ambulatory Visit | Attending: Pulmonary Disease | Admitting: Pulmonary Disease

## 2018-06-29 DIAGNOSIS — J439 Emphysema, unspecified: Secondary | ICD-10-CM | POA: Diagnosis not present

## 2018-06-29 DIAGNOSIS — J438 Other emphysema: Secondary | ICD-10-CM

## 2018-06-29 NOTE — Progress Notes (Signed)
Daily Session Note  Patient Details  Name: Michael Robertson. MRN: [id number] Date of Birth: 1945-12-11 Referring Provider:     Pulmonary Rehab Walk Test from 04/04/2018 in [place]  Referring Provider  Dr. Nelda Marseille      Encounter Date: 06/29/2018  Check In: Session Check In - 06/29/18 1105      Check-In   Supervising physician immediately available to respond to emergencies  [facility] immediately available    Physician(s)  Dr. Lonny Prude    Location  [facility]    Staff Present  Rosebud Poles, RN, BSN;Carlette Wilber Oliphant, RN, Bjorn Loser, MS, Exercise Physiologist    Medication changes reported      No    Fall or balance concerns reported     No    Tobacco Cessation  No Change    Warm-up and Cool-down  Performed as group-led instruction    Resistance Training Performed  Yes    VAD Patient?  No    PAD/SET Patient?  No      Pain Assessment   Currently in Pain?  No/denies    Pain Score  0-No pain    Multiple Pain Sites  No       Capillary Blood Glucose: No results found for this or any previous visit (from the past 24 hour(s)).    Social History   Tobacco Use  Smoking Status Former Smoker  . Packs/day: 1.75  . Years: 40.00  . Pack years: 70.00  . Types: Cigarettes  . Last attempt to quit: 03/01/2004  . Years since quitting: 14.3  Smokeless Tobacco Never Used    Goals Met:  Exercise tolerated well No report of cardiac concerns or symptoms Strength training completed today  Goals Unmet:  Not Applicable  Comments: Service time is from 1030 to 1215    Dr. Rush Farmer is Medical Director for Pulmonary Rehab at [facility]

## 2018-07-04 ENCOUNTER — Encounter (HOSPITAL_COMMUNITY)
Admission: RE | Admit: 2018-07-04 | Discharge: 2018-07-04 | Disposition: A | Discharge status: Home / Self Care | Payer: No Typology Code available for payment source | Source: Ambulatory Visit | Attending: Pulmonary Disease | Admitting: Pulmonary Disease

## 2018-07-04 ENCOUNTER — Encounter (HOSPITAL_COMMUNITY): Payer: No Typology Code available for payment source

## 2018-07-04 DIAGNOSIS — J438 Other emphysema: Secondary | ICD-10-CM

## 2018-07-04 DIAGNOSIS — J439 Emphysema, unspecified: Secondary | ICD-10-CM | POA: Diagnosis not present

## 2018-07-04 NOTE — Progress Notes (Signed)
Daily Session Note  Patient Details  Name: Michael Robertson. MRN: [id number] Date of Birth: May 04, 1946 Referring Provider:     Pulmonary Rehab Walk Test from 04/04/2018 in [place]  Referring Provider  Dr. Nelda Marseille      Encounter Date: 07/04/2018  Check In: Session Check In - 07/04/18 1024      Check-In   Supervising physician immediately available to respond to emergencies  See telemetry face sheet for immediately available MD    Physician(s)  Dr. Nevada Crane    Location  [facility]    Staff Present  Joycelyn Man RN, BSN;Dalton Kris Mouton, MS, Exercise Physiologist;Jossue Rubenstein Leonia Reeves, RN, BSN;Carlette Wilber Oliphant, RN, Roque Cash, RN    Medication changes reported      No    Fall or balance concerns reported     No    Tobacco Cessation  No Change    Warm-up and Cool-down  Performed as group-led instruction    Resistance Training Performed  Yes    VAD Patient?  No    PAD/SET Patient?  No      Pain Assessment   Currently in Pain?  No/denies    Multiple Pain Sites  No       Capillary Blood Glucose: No results found for this or any previous visit (from the past 24 hour(s)).    Social History   Tobacco Use  Smoking Status Former Smoker  . Packs/day: 1.75  . Years: 40.00  . Pack years: 70.00  . Types: Cigarettes  . Last attempt to quit: 03/01/2004  . Years since quitting: 14.3  Smokeless Tobacco Never Used    Goals Met:  Proper associated with RPD/PD & O2 Sat Using PLB without cueing & demonstrates good technique Exercise tolerated well Strength training completed today  Goals Unmet:  Not Applicable  Comments: Service time is from 1030 to [place]    Dr. Rush Farmer is Medical Director for Pulmonary Rehab at [facility]

## 2018-07-04 NOTE — Progress Notes (Signed)
Daily Session Note  Patient Details  Name: Michael Robertson. MRN: [id number] Date of Birth: Dec 01, 1945 Referring Provider:     Pulmonary Rehab Walk Test from 04/04/2018 in [place]  Referring Provider  Dr. Nelda Marseille      Encounter Date: 07/04/2018  Check In: Session Check In - 07/04/18 1024      Check-In   Supervising physician immediately available to respond to emergencies  See telemetry face sheet for immediately available MD    Physician(s)  Dr. Nevada Crane    Location  [facility]    Staff Present  Joycelyn Man RN, BSN;Dalton Kris Mouton, MS, Exercise Physiologist;Paras Kreider Leonia Reeves, RN, BSN;Carlette Wilber Oliphant, RN, Roque Cash, RN    Medication changes reported      No    Fall or balance concerns reported     No    Tobacco Cessation  No Change    Warm-up and Cool-down  Performed as group-led instruction    Resistance Training Performed  Yes    VAD Patient?  No    PAD/SET Patient?  No      Pain Assessment   Currently in Pain?  No/denies    Multiple Pain Sites  No       Capillary Blood Glucose: No results found for this or any previous visit (from the past 24 hour(s)).    Social History   Tobacco Use  Smoking Status Former Smoker  . Packs/day: 1.75  . Years: 40.00  . Pack years: 70.00  . Types: Cigarettes  . Last attempt to quit: 03/01/2004  . Years since quitting: 14.3  Smokeless Tobacco Never Used    Goals Met:  Proper associated with RPD/PD & O2 Sat Exercise tolerated well Strength training completed today  Goals Unmet:  Not Applicable  Comments: Service time is from 1030 to 1210    Dr. Rush Farmer is Medical Director for Pulmonary Rehab at [facility].

## 2018-07-06 ENCOUNTER — Encounter (HOSPITAL_COMMUNITY): Payer: No Typology Code available for payment source

## 2018-07-06 ENCOUNTER — Encounter (HOSPITAL_COMMUNITY)
Admission: RE | Admit: 2018-07-06 | Discharge: 2018-07-06 | Disposition: A | Discharge status: Home / Self Care | Payer: No Typology Code available for payment source | Source: Ambulatory Visit | Attending: Pulmonary Disease | Admitting: Pulmonary Disease

## 2018-07-06 VITALS — Wt 166.4 lb

## 2018-07-06 DIAGNOSIS — J438 Other emphysema: Secondary | ICD-10-CM

## 2018-07-06 DIAGNOSIS — J439 Emphysema, unspecified: Secondary | ICD-10-CM | POA: Diagnosis not present

## 2018-07-06 NOTE — Progress Notes (Signed)
Daily Session Note  Patient Details  Name: Michael Robertson. MRN: [id number] Date of Birth: 1945/11/06 Referring Provider:     Pulmonary Rehab Walk Test from 04/04/2018 in [place]  Referring Provider  Dr. Nelda Marseille      Encounter Date: 07/06/2018  Check In: Session Check In - 07/06/18 1052      Check-In   Supervising physician immediately available to respond to emergencies  [facility] immediately available    Physician(s)  Dr. Karleen Hampshire    Location  [facility]    Staff Present  Joycelyn Man RN, BSN;Dalton Kris Mouton, MS, Exercise Physiologist;Joan Leonia Reeves, RN, BSN;Carlette Wilber Oliphant, RN, Roque Cash, RN    Medication changes reported      No    Fall or balance concerns reported     No    Tobacco Cessation  No Change    Warm-up and Cool-down  Performed as group-led instruction    Resistance Training Performed  Yes    VAD Patient?  No    PAD/SET Patient?  No      Pain Assessment   Currently in Pain?  No/denies    Pain Score  0-No pain    Multiple Pain Sites  No       Capillary Blood Glucose: No results found for this or any previous visit (from the past 24 hour(s)).    Social History   Tobacco Use  Smoking Status Former Smoker  . Packs/day: 1.75  . Years: 40.00  . Pack years: 70.00  . Types: Cigarettes  . Last attempt to quit: 03/01/2004  . Years since quitting: 14.3  Smokeless Tobacco Never Used    Goals Met:  Proper associated with RPD/PD & O2 Sat Exercise tolerated well Strength training completed today  Goals Unmet:  Not Applicable  Comments: Service time from 1030 to 1215.   Dr. Rush Farmer is Medical Director for Pulmonary Rehab at [facility].

## 2018-07-06 NOTE — Progress Notes (Signed)
I have reviewed the pulmonary graduate packet with Lala LundHenry W Chenette Jr. Marland Kitchen. Sherilyn CooterHenry is scheduled to graduate on 07/18/2018. The patient was given an exercise prescription and stated that they planned to continue exercise at home and possibly the pulmonary maintenance program.  Sherilyn CooterHenry and I discussed how to progress their exercise prescription.  The patient stated that they understand the exercise prescription.  We reviewed exercise guidelines, target heart rate during exercise, RPE Scale, weather conditions, NTG use, endpoints for exercise, warmup and cool down.  The patient was also given information regarding the pulmonary maintenance program and given homework to complete and return before graduation. Patient is encouraged to come to me with any questions.

## 2018-07-11 ENCOUNTER — Encounter (HOSPITAL_COMMUNITY): Payer: No Typology Code available for payment source

## 2018-07-13 ENCOUNTER — Encounter (HOSPITAL_COMMUNITY): Payer: No Typology Code available for payment source

## 2018-07-18 ENCOUNTER — Encounter (HOSPITAL_COMMUNITY): Payer: No Typology Code available for payment source

## 2018-07-20 ENCOUNTER — Encounter (HOSPITAL_COMMUNITY)
Admission: RE | Admit: 2018-07-20 | Discharge: 2018-07-20 | Disposition: A | Discharge status: Home / Self Care | Payer: No Typology Code available for payment source | Source: Ambulatory Visit | Attending: Pulmonary Disease | Admitting: Pulmonary Disease

## 2018-07-20 ENCOUNTER — Encounter (HOSPITAL_COMMUNITY): Payer: No Typology Code available for payment source

## 2018-07-20 DIAGNOSIS — Z87891 Personal history of nicotine dependence: Secondary | ICD-10-CM | POA: Insufficient documentation

## 2018-07-20 DIAGNOSIS — J439 Emphysema, unspecified: Secondary | ICD-10-CM | POA: Diagnosis not present

## 2018-07-20 DIAGNOSIS — E785 Hyperlipidemia, unspecified: Secondary | ICD-10-CM | POA: Diagnosis not present

## 2018-07-20 DIAGNOSIS — J449 Chronic obstructive pulmonary disease, unspecified: Secondary | ICD-10-CM | POA: Insufficient documentation

## 2018-07-20 DIAGNOSIS — J438 Other emphysema: Secondary | ICD-10-CM

## 2018-07-20 DIAGNOSIS — N189 Chronic kidney disease, unspecified: Secondary | ICD-10-CM | POA: Insufficient documentation

## 2018-07-20 DIAGNOSIS — Z79899 Other long term (current) drug therapy: Secondary | ICD-10-CM | POA: Insufficient documentation

## 2018-07-20 DIAGNOSIS — I129 Hypertensive chronic kidney disease with stage 1 through stage 4 chronic kidney disease, or unspecified chronic kidney disease: Secondary | ICD-10-CM | POA: Diagnosis not present

## 2018-07-20 NOTE — Progress Notes (Signed)
Daily Session Note  Patient Details  Name: Michael Robertson. MRN: [id number] Date of Birth: 05-24-1946 Referring Provider:     Pulmonary Rehab Walk Test from 04/04/2018 in [place]  Referring Provider  Dr. Nelda Marseille      Encounter Date: 07/20/2018  Check In: Session Check In - 07/20/18 1340      Check-In   Supervising physician immediately available to respond to emergencies  [facility] immediately available    Physician(s)  Dr. Nevada Crane    Location  [facility]    Staff Present  Joycelyn Man RN, BSN;Carlette Wilber Oliphant, RN, Bjorn Loser, MS, Exercise Physiologist;Yacoub Diltz Ysidro Evert, RN;Joan Leonia Reeves, RN, BSN    Medication changes reported      No    Fall or balance concerns reported     No    Tobacco Cessation  No Change    Warm-up and Cool-down  Performed as group-led instruction    Resistance Training Performed  Yes    VAD Patient?  No    PAD/SET Patient?  No      Pain Assessment   Currently in Pain?  No/denies    Pain Score  0-No pain    Multiple Pain Sites  No       Capillary Blood Glucose: No results found for this or any previous visit (from the past 24 hour(s)).    Social History   Tobacco Use  Smoking Status Former Smoker  . Packs/day: 1.75  . Years: 40.00  . Pack years: 70.00  . Types: Cigarettes  . Last attempt to quit: 03/01/2004  . Years since quitting: 14.3  Smokeless Tobacco Never Used    Goals Met:  Exercise tolerated well No report of cardiac concerns or symptoms Strength training completed today  Goals Unmet:  Not Applicable  Comments: Service time is from 1030 to 1220    Dr. Rush Farmer is Medical Director for Pulmonary Rehab at [facility].

## 2018-07-25 ENCOUNTER — Encounter (HOSPITAL_COMMUNITY)
Admission: RE | Admit: 2018-07-25 | Discharge: 2018-07-25 | Disposition: A | Discharge status: Home / Self Care | Payer: No Typology Code available for payment source | Source: Ambulatory Visit | Attending: Pulmonary Disease | Admitting: Pulmonary Disease

## 2018-07-25 DIAGNOSIS — J438 Other emphysema: Secondary | ICD-10-CM

## 2018-07-25 DIAGNOSIS — J439 Emphysema, unspecified: Secondary | ICD-10-CM | POA: Diagnosis not present

## 2018-07-27 ENCOUNTER — Other Ambulatory Visit: Payer: Self-pay | Admitting: Family Medicine

## 2018-08-25 NOTE — Addendum Note (Signed)
Encounter addended by: Enid Skeens, RD on: 08/25/2018 9:15 AM  Actions taken: Flowsheet data copied forward, Visit Navigator Flowsheet section accepted

## 2018-08-31 NOTE — Addendum Note (Signed)
Encounter addended by: Drema PryBehrens, Vali Capano A, RN on: 08/31/2018 2:18 PM  Actions taken: Episode resolved

## 2018-09-02 ENCOUNTER — Other Ambulatory Visit: Payer: Self-pay | Admitting: Family Medicine

## 2018-09-02 DIAGNOSIS — N400 Enlarged prostate without lower urinary tract symptoms: Secondary | ICD-10-CM

## 2018-09-02 DIAGNOSIS — E7800 Pure hypercholesterolemia, unspecified: Secondary | ICD-10-CM

## 2018-09-05 ENCOUNTER — Telehealth (HOSPITAL_COMMUNITY): Payer: Self-pay | Admitting: *Deleted

## 2018-09-19 ENCOUNTER — Ambulatory Visit (HOSPITAL_COMMUNITY): Payer: Medicare Other

## 2018-09-20 ENCOUNTER — Encounter (HOSPITAL_COMMUNITY): Payer: Self-pay | Admitting: *Deleted

## 2018-09-20 NOTE — Progress Notes (Signed)
Michael Robertson was supposed to start the Pulmonary Maintenance Program 09/19/2018.  He has a bad cold and cough. Michael Robertson states that he has been sick for a week. I did encourage him to call his MD if he didn't  feel better. He is going to try to start on 09/26/2018 depending on how he is feeling.

## 2018-09-21 ENCOUNTER — Ambulatory Visit (HOSPITAL_COMMUNITY): Payer: Non-veteran care

## 2018-09-26 ENCOUNTER — Ambulatory Visit (HOSPITAL_COMMUNITY): Payer: Non-veteran care

## 2018-09-27 ENCOUNTER — Other Ambulatory Visit: Payer: Self-pay | Admitting: Family Medicine

## 2018-09-28 ENCOUNTER — Ambulatory Visit (HOSPITAL_COMMUNITY): Payer: Non-veteran care

## 2018-09-29 ENCOUNTER — Other Ambulatory Visit: Payer: Self-pay | Admitting: Family Medicine

## 2018-09-29 DIAGNOSIS — J439 Emphysema, unspecified: Secondary | ICD-10-CM

## 2018-10-02 ENCOUNTER — Telehealth (HOSPITAL_COMMUNITY): Payer: Self-pay

## 2018-10-02 ENCOUNTER — Encounter: Payer: Self-pay | Admitting: Family Medicine

## 2018-10-02 ENCOUNTER — Telehealth: Payer: Self-pay

## 2018-10-02 ENCOUNTER — Ambulatory Visit (INDEPENDENT_AMBULATORY_CARE_PROVIDER_SITE_OTHER): Payer: Medicare Other | Admitting: Family Medicine

## 2018-10-02 ENCOUNTER — Other Ambulatory Visit: Payer: Self-pay

## 2018-10-02 DIAGNOSIS — J439 Emphysema, unspecified: Secondary | ICD-10-CM | POA: Diagnosis not present

## 2018-10-02 DIAGNOSIS — J9611 Chronic respiratory failure with hypoxia: Secondary | ICD-10-CM

## 2018-10-02 DIAGNOSIS — I10 Essential (primary) hypertension: Secondary | ICD-10-CM | POA: Diagnosis not present

## 2018-10-02 MED ORDER — LOSARTAN POTASSIUM 50 MG PO TABS: 50.0000 mg | ORAL_TABLET | Freq: Every day | ORAL | 3 refills | Status: DC

## 2018-10-02 MED ORDER — PREDNISONE 20 MG PO TABS: 20.0000 mg | ORAL_TABLET | Freq: Three times a day (TID) | ORAL | 12 refills | Status: DC

## 2018-10-02 MED ORDER — AZITHROMYCIN 250 MG PO TABS: 250.0000 mg | ORAL_TABLET | Freq: Every day | ORAL | 3 refills | Status: DC

## 2018-10-02 NOTE — Telephone Encounter (Signed)
Pharmacy called to confirm does of Azithromycin. Taking daily instead of 3 days a week. Spoke to Dr. [name] to confirm dose. Notified pharmacy. Sunday Spillers, CMA

## 2018-10-02 NOTE — Patient Instructions (Signed)
Stay away from crowds. I refilled your prednisone I switch you to a lower dose of azithromicin to take every day. I also switched from lisinopril to losartan.  No cough with losartan.

## 2018-10-03 ENCOUNTER — Encounter (HOSPITAL_COMMUNITY): Payer: Self-pay | Admitting: *Deleted

## 2018-10-03 ENCOUNTER — Ambulatory Visit (HOSPITAL_COMMUNITY): Payer: Non-veteran care

## 2018-10-03 ENCOUNTER — Encounter: Payer: Self-pay | Admitting: Family Medicine

## 2018-10-03 NOTE — Assessment & Plan Note (Addendum)
Well controled.  Per pulm rec, will switch from ACE to ARB (hoping for less cough.)

## 2018-10-03 NOTE — Progress Notes (Signed)
I contacted Michael Robertson today to inform that the Pulmonary Rehab Maintenance has been cancelled the next two weeks. He voices understanding. I informed him that we would be back in contact with him to give him further updates on reopening.

## 2018-10-03 NOTE — Assessment & Plan Note (Signed)
Rx as exacerbation with prednisone.  Switch azithro to 250 mg daily.

## 2018-10-03 NOTE — Assessment & Plan Note (Signed)
Rx as exacerbation.  OK to go home.  Self isolate to avoid infection.

## 2018-10-03 NOTE — Progress Notes (Signed)
Established Patient Office Visit  Subjective:  Patient ID: Michael Robertson., male    DOB: [birth year]  Age: 73 y.o. MRN: [id number]  CC:  Chief Complaint  Patient presents with  . Breathing Problem    HPI Daymon Buchholtz. presents for worsening shortness of breath.  Patient with severe COPD with home O2 requirement having worse SOB x 5 days.  Out of refills on his prednisone (I trust him to self treat during flairs.)  Also, on chronic azithro, 500 3x/wk.  States he feels worse the times he skips two days.  Past Medical History:  Diagnosis Date  . Chronic kidney disease   . COPD (chronic obstructive pulmonary disease) (HCC)   . GERD (gastroesophageal reflux disease)   . Hyperlipidemia   . Hypertension   . Insomnia     History reviewed. No pertinent surgical history.  Family History  Problem Relation Age of Onset  . Alzheimer's disease Sister   . Diabetes Sister   . Stroke Mother   . Dementia Mother   . Diabetes Mother   . Cancer Father        colon  . Stroke Father   . Obesity Daughter   . Diabetes Daughter   . Hypertension Daughter     Social History   Socioeconomic History  . Marital status: Single    Spouse name: Not on file  . Number of children: 3  . Years of education: 2 college  . Highest education level: Not on file  Occupational History  . Occupation: Retired- Paediatric nurse: RETIRED  Social Needs  . Financial resource strain: Not on file  . Food insecurity:    Worry: Not on file    Inability: Not on file  . Transportation needs:    Medical: Not on file    Non-medical: Not on file  Tobacco Use  . Smoking status: Former Smoker    Packs/day: 1.75    Years: 40.00    Pack years: 70.00    Types: Cigarettes    Last attempt to quit: 03/01/2004    Years since quitting: 14.6  . Smokeless tobacco: Never Used  Substance and Sexual Activity  . Alcohol use: No  . Drug use: No  . Sexual activity: Yes  Lifestyle  . Physical activity:    Days  per week: Not on file    Minutes per session: Not on file  . Stress: Not on file  Relationships  . Social connections:    Talks on phone: Not on file    Gets together: Not on file    Attends religious service: Not on file    Active member of club or organization: Not on file    Attends meetings of clubs or organizations: Not on file    Relationship status: Not on file  . Intimate partner violence:    Fear of current or ex partner: Not on file    Emotionally abused: Not on file    Physically abused: Not on file    Forced sexual activity: Not on file  Other Topics Concern  . Not on file  Social History Narrative   Health Care POA:    Emergency Contact: Fiance: Dorthey Sawyer (c) [phone]   End of Life Plan:    Who lives with you: [place]   Any pets: Cat   Diet: Pt has a varied diet of protein, starch, and vegetables.   Exercise: Pt does not have regular exercise routine and  has a hard time exercising due to COPD, but is active.   Seatbelts: Pt reports wearing seatbelt when in vehicle.   Sun Exposure/Protection:    Hobbies: TV, washing/waxing car, visiting family.           Outpatient Medications Prior to Visit  Medication Sig Dispense Refill  . bisoprolol (ZEBETA) 5 MG tablet TAKE 1 TABLET DAILY 90 tablet 3  . cholecalciferol (VITAMIN D) 1000 units tablet Take 1,000 Units by mouth daily.    . fluticasone (FLONASE) 50 MCG/ACT nasal spray USE 2 SPRAYS NASALLY DAILY 32 g 2  . hydrochlorothiazide (HYDRODIURIL) 25 MG tablet TAKE 1 TABLET DAILY 90 tablet 4  . mometasone (ASMANEX) 220 MCG/INH inhaler Inhale 2 puffs into the lungs at bedtime.    . Multiple Vitamins-Minerals (CENTRUM SILVER ADULT 50+ PO) Take by mouth.    Marland Kitchen omeprazole (PRILOSEC) 40 MG capsule TAKE 1 CAPSULE DAILY 90 capsule 3  . pravastatin (PRAVACHOL) 40 MG tablet TAKE 1 TABLET EVERY EVENING 90 tablet 4  . sildenafil (VIAGRA) 100 MG tablet TAKE 1 TABLET AS NEEDED FOR ERECTILE DYSFUNCTION 18 tablet 2  . tamsulosin (FLOMAX)  0.4 MG CAPS capsule TAKE 1 CAPSULE DAILY FOR PROSTATE 90 capsule 4  . Tiotropium Bromide-Olodaterol (STIOLTO RESPIMAT) 2.5-2.5 MCG/ACT AERS Inhale 2 puffs into the lungs daily.    . traZODone (DESYREL) 100 MG tablet TAKE 1 TABLET AT BEDTIME 90 tablet 3  . VENTOLIN HFA 108 (90 Base) MCG/ACT inhaler USE 2 INHALATIONS EVERY 4 HOURS AS NEEDED. RESCUE FOR SHORTNESS OF BREATH 54 g 1  . VIRTUSSIN A/C 100-10 MG/5ML syrup TAKE 5 ML BY MOUTH  TWICE DAILY AS NEEDED FOR COUGH 120 mL 4  . azithromycin (ZITHROMAX) 500 MG tablet TAKE ONE TABLET BY MOUTH ONCE DAILY WHEN  BREATHING  WORSENS (Patient taking differently: 3 (three) times a week. Mondays, Wednesdays and Fridays) 5 tablet 6  . lisinopril (PRINIVIL,ZESTRIL) 10 MG tablet TAKE 1 TABLET DAILY 90 tablet 4  . predniSONE (DELTASONE) 20 MG tablet TAKE ONE TABLET BY MOUTH THREE TIMES DAILY (Patient not taking: Reported on 04/03/2018) 15 tablet 12   No facility-administered medications prior to visit.     Allergies  Allergen Reactions  . Doxycycline     REACTION: vomiting    ROS Review of Systems    Objective:    Physical Exam  BP 118/62   Pulse 76   Temp 98 F (36.7 C) (Oral)   Ht 5\' 11"  (1.803 m)   Wt 171 lb 6.4 oz (77.7 kg)   SpO2 96% Comment: @2L   BMI 23.91 kg/m  Wt Readings from Last 3 Encounters:  10/02/18 171 lb 6.4 oz (77.7 kg)  07/06/18 166 lb 7.2 oz (75.5 kg)  06/27/18 165 lb 12.6 oz (75.2 kg)   Poor air movement with diffuse wheeze.  No increase work of breathing at rest.  Cardiac RRR without m or g Ext no edema   Health Maintenance Due  Topic Date Due  . OPHTHALMOLOGY EXAM  10/27/1954    There are no preventive care reminders to display for this patient.  Lab Results  Component Value Date   TSH 1.606 04/10/2014   Lab Results  Component Value Date   WBC 7.1 11/17/2016   HGB 14.6 11/17/2016   HCT 43.7 11/17/2016   MCV 96 11/17/2016   PLT 221 11/17/2016   Lab Results  Component Value Date   NA 141 11/17/2016    K 4.2 11/17/2016   CO2 25 11/17/2016  GLUCOSE 88 11/17/2016   BUN 20 11/17/2016   CREATININE 1.47 (H) 11/17/2016   BILITOT 0.7 11/17/2016   ALKPHOS 60 11/17/2016   AST 18 11/17/2016   ALT 15 11/17/2016   PROT 6.7 11/17/2016   ALBUMIN 4.3 11/17/2016   CALCIUM 9.5 11/17/2016   Lab Results  Component Value Date   CHOL 183 11/17/2016   Lab Results  Component Value Date   HDL 46 11/17/2016   Lab Results  Component Value Date   LDLCALC 103 (H) 11/17/2016   Lab Results  Component Value Date   TRIG 170 (H) 11/17/2016   Lab Results  Component Value Date   CHOLHDL 4.0 11/17/2016   Lab Results  Component Value Date   HGBA1C 5.8 04/05/2018      Assessment & Plan:   Problem List Items Addressed This Visit    HYPERTENSION, BENIGN SYSTEMIC (Chronic)   Relevant Medications   losartan (COZAAR) 50 MG tablet   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 predniSONE (DELTASONE) 20 MG tablet    Sig: Take 1 tablet (20 mg total) by mouth 3 (three) times daily.    Dispense:  15 tablet    Refill:  12    Please consider 90 day supplies to promote better adherence  . azithromycin (ZITHROMAX) 250 MG tablet    Sig: Take 1 tablet (250 mg total) by mouth daily.    Dispense:  90 tablet    Refill:  3  . losartan (COZAAR) 50 MG tablet    Sig: Take 1 tablet (50 mg total) by mouth daily.    Dispense:  90 tablet    Refill:  3    Replaces lisinopril    Follow-up: No follow-ups on file.    Moses Manners, MD

## 2018-10-05 ENCOUNTER — Ambulatory Visit (HOSPITAL_COMMUNITY): Payer: Non-veteran care

## 2018-10-09 ENCOUNTER — Other Ambulatory Visit: Payer: Self-pay

## 2018-10-09 ENCOUNTER — Emergency Department (HOSPITAL_COMMUNITY)
Admission: EM | Admit: 2018-10-09 | Discharge: 2018-10-10 | Disposition: A | Discharge status: Home / Self Care | Payer: Medicare Other | Attending: Emergency Medicine | Admitting: Emergency Medicine

## 2018-10-09 ENCOUNTER — Telehealth: Payer: Self-pay | Admitting: Family Medicine

## 2018-10-09 ENCOUNTER — Encounter (HOSPITAL_COMMUNITY): Payer: Self-pay | Admitting: Emergency Medicine

## 2018-10-09 DIAGNOSIS — Z79899 Other long term (current) drug therapy: Secondary | ICD-10-CM | POA: Diagnosis not present

## 2018-10-09 DIAGNOSIS — N183 Chronic kidney disease, stage 3 (moderate): Secondary | ICD-10-CM | POA: Diagnosis not present

## 2018-10-09 DIAGNOSIS — Z87891 Personal history of nicotine dependence: Secondary | ICD-10-CM | POA: Insufficient documentation

## 2018-10-09 DIAGNOSIS — I129 Hypertensive chronic kidney disease with stage 1 through stage 4 chronic kidney disease, or unspecified chronic kidney disease: Secondary | ICD-10-CM | POA: Diagnosis not present

## 2018-10-09 DIAGNOSIS — J449 Chronic obstructive pulmonary disease, unspecified: Secondary | ICD-10-CM | POA: Insufficient documentation

## 2018-10-09 DIAGNOSIS — I480 Paroxysmal atrial fibrillation: Secondary | ICD-10-CM

## 2018-10-09 DIAGNOSIS — E1122 Type 2 diabetes mellitus with diabetic chronic kidney disease: Secondary | ICD-10-CM | POA: Diagnosis not present

## 2018-10-09 DIAGNOSIS — I4891 Unspecified atrial fibrillation: Secondary | ICD-10-CM | POA: Diagnosis present

## 2018-10-09 NOTE — ED Triage Notes (Signed)
Per EMS, past two weeks, pt has been adjusting his bp medications w/ his provider and treating his COPD.  Today he felt that his heart kept speeding up/slowing down, EMS found him at 160.  no hx of afib, converted while in route w/ EMS.  Denies any pain, sob, weakness, n/v.

## 2018-10-09 NOTE — ED Notes (Signed)
Wife Mariron [phone]

## 2018-10-09 NOTE — Telephone Encounter (Signed)
**  After Hours/ Emergency Line Call*  Received a call to report that Michael Robertson. is experiencing heart palpitations over the past several hours. He reports his pulse will go up to 155 and then come down to the 40s. He feels like his heart is skipping beats and "quivering in my chest". He reports baseline SOB, nothing worse than usual. He was just treated for a COPD flare with a steroid burst. He takes azithro 3 times a week. He is on 2L home oxygen. He denies chest pain. He denies feeling lightheaded or any syncopal episodes. He reports his oxygen level does not drop when his heart rate fluctuates. Recommended patient call EMS to go to ED as it sounds like he is in an abnormal rhythm, likely afib possibly with RVR. He is agreeable and plans to call 911. Will forward to PCP.  Tillman Sers, DO PGY-3, [facility]

## 2018-10-10 ENCOUNTER — Ambulatory Visit (HOSPITAL_COMMUNITY): Payer: Non-veteran care

## 2018-10-10 ENCOUNTER — Encounter (HOSPITAL_COMMUNITY): Payer: Self-pay

## 2018-10-10 DIAGNOSIS — I480 Paroxysmal atrial fibrillation: Secondary | ICD-10-CM | POA: Diagnosis not present

## 2018-10-10 LAB — CBC
HCT: 48.7 % (ref 39.0–52.0)
Hemoglobin: 15.5 g/dL (ref 13.0–17.0)
MCH: 30.6 pg (ref 26.0–34.0)
MCHC: 31.8 g/dL (ref 30.0–36.0)
MCV: 96.1 fL (ref 80.0–100.0)
Platelets: 396 10*3/uL (ref 150–400)
RBC: 5.07 MIL/uL (ref 4.22–5.81)
RDW: 11.9 % (ref 11.5–15.5)
WBC: 19.4 10*3/uL — ABNORMAL HIGH (ref 4.0–10.5)
nRBC: 0 % (ref 0.0–0.2)

## 2018-10-10 LAB — BASIC METABOLIC PANEL
Anion gap: 12 (ref 5–15)
BUN: 25 mg/dL — ABNORMAL HIGH (ref 8–23)
CO2: 25 mmol/L (ref 22–32)
Calcium: 9 mg/dL (ref 8.9–10.3)
Chloride: 100 mmol/L (ref 98–111)
Creatinine, Ser: 1.46 mg/dL — ABNORMAL HIGH (ref 0.61–1.24)
GFR calc Af Amer: 55 mL/min — ABNORMAL LOW (ref >60)
GFR calc non Af Amer: 47 mL/min — ABNORMAL LOW (ref >60)
Glucose, Bld: 109 mg/dL — ABNORMAL HIGH (ref 70–99)
Potassium: 3.6 mmol/L (ref 3.5–5.1)
Sodium: 137 mmol/L (ref 135–145)

## 2018-10-10 LAB — MAGNESIUM: Magnesium: 2.1 mg/dL (ref 1.7–2.4)

## 2018-10-10 MED ORDER — APIXABAN 5 MG PO TABS
5.0000 mg | ORAL_TABLET | Freq: Once | ORAL | Status: AC
  Administered 2018-10-10: 5 mg via ORAL
  Filled 2018-10-10: qty 1

## 2018-10-10 MED ORDER — APIXABAN 5 MG PO TABS: 5.0000 mg | ORAL_TABLET | Freq: Two times a day (BID) | ORAL | 0 refills | Status: DC

## 2018-10-10 NOTE — ED Provider Notes (Signed)
[facility] EMERGENCY DEPARTMENT Provider Note   CSN: [id number] Arrival date & time: 10/09/18  2325    History   Chief Complaint Chief Complaint  Patient presents with  . Atrial Fibrillation    HPI Michael Robertson. is a 73 y.o. male.     The history is provided by the patient.  Atrial Fibrillation  This is a new problem. The current episode started 12 to 24 hours ago. The problem occurs constantly. The problem has been resolved. Pertinent negatives include no chest pain and no shortness of breath. Nothing aggravates the symptoms. Nothing relieves the symptoms. Treatments tried: metoprolol. The treatment provided significant relief.  Patient with history of COPD on oxygen, chronic kidney disease, GERD presents with possible atrial fibrillation.  He reports for the past day he has been having his heart speeding up and slowing down.  It was noted to be 160 by EMS.  They were attempting to give adenosine, but prior to medication being given his symptoms resolved.  He did take a dose of his beta-blocker at home.  No known history of A. fib.  He now feels at baseline.  No new fevers or cough.  He had a recent COPD exacerbation is now improved, he was on prednisone No h/o GI Bleed He is not on anticoagulants Past Medical History:  Diagnosis Date  . Chronic kidney disease   . COPD (chronic obstructive pulmonary disease) (HCC)   . GERD (gastroesophageal reflux disease)   . Hyperlipidemia   . Hypertension   . Insomnia     Patient Active Problem List   Diagnosis Date Noted  . Constipation 11/23/2017  . Well controlled type 2 diabetes mellitus (HCC) 11/23/2017  . Eczema 04/14/2017  . Presbycusis of both ears 04/07/2016  . Chronic respiratory failure with hypoxia (HCC) 12/04/2015  . Allergic rhinitis 10/04/2014  . Peyronie disease 04/06/2013  . Rotator cuff syndrome 05/18/2012  . Hypercholesteremia 02/14/2012  . CKD (chronic kidney disease) stage 3, GFR 30-59  ml/min (HCC) 08/23/2011  . Palliative care encounter 08/20/2011  . Insomnia 04/09/2011  . ERECTILE DYSFUNCTION 12/03/2009  . G E R D 08/20/2009  . Benign prostatic hypertrophy (BPH) with weak urinary stream 08/20/2009  . HYPERTENSION, BENIGN SYSTEMIC 09/15/2006  . COPD (chronic obstructive pulmonary disease) with emphysema (HCC) 09/15/2006    History reviewed. No pertinent surgical history.      Home Medications    Prior to Admission medications   Medication Sig Start Date End Date Taking? Authorizing Provider  azithromycin (ZITHROMAX) 250 MG tablet Take 1 tablet (250 mg total) by mouth daily. 10/02/18   Moses Manners, MD  bisoprolol (ZEBETA) 5 MG tablet TAKE 1 TABLET DAILY 09/27/18   Moses Manners, MD  cholecalciferol (VITAMIN D) 1000 units tablet Take 1,000 Units by mouth daily.    [provider]  fluticasone (FLONASE) 50 MCG/ACT nasal spray USE 2 SPRAYS NASALLY DAILY 05/19/15   Moses Manners, MD  hydrochlorothiazide (HYDRODIURIL) 25 MG tablet TAKE 1 TABLET DAILY 07/27/18   Moses Manners, MD  losartan (COZAAR) 50 MG tablet Take 1 tablet (50 mg total) by mouth daily. 10/02/18   Moses Manners, MD  mometasone [facility]) 220 MCG/INH inhaler Inhale 2 puffs into the lungs at bedtime.    [provider]  Multiple Vitamins-Minerals (CENTRUM SILVER ADULT 50+ PO) Take by mouth.    [provider]  omeprazole (PRILOSEC) 40 MG capsule TAKE 1 CAPSULE DAILY 10/07/17   Doralee Albino  A, MD  pravastatin (PRAVACHOL) 40 MG tablet TAKE 1 TABLET EVERY EVENING 09/04/18   Hensel, Santiago Bumpers, MD  predniSONE (DELTASONE) 20 MG tablet Take 1 tablet (20 mg total) by mouth 3 (three) times daily. 10/02/18   Moses Manners, MD  sildenafil (VIAGRA) 100 MG tablet TAKE 1 TABLET AS NEEDED FOR ERECTILE DYSFUNCTION 04/05/18   Moses Manners, MD  tamsulosin (FLOMAX) 0.4 MG CAPS capsule TAKE 1 CAPSULE DAILY FOR PROSTATE 09/04/18   Moses Manners, MD  Tiotropium  Bromide-Olodaterol (STIOLTO RESPIMAT) 2.5-2.5 MCG/ACT AERS Inhale 2 puffs into the lungs daily.    [provider]  traZODone (DESYREL) 100 MG tablet TAKE 1 TABLET AT BEDTIME 09/27/18   Hensel, Santiago Bumpers, MD  VENTOLIN HFA 108 (90 Base) MCG/ACT inhaler USE 2 INHALATIONS EVERY 4 HOURS AS NEEDED. RESCUE FOR SHORTNESS OF BREATH 08/19/15   Nestor Ramp, MD  VIRTUSSIN A/C 100-10 MG/5ML syrup TAKE 5 ML BY MOUTH  TWICE DAILY AS NEEDED FOR COUGH 09/29/18   Moses Manners, MD    Family History Family History  Problem Relation Age of Onset  . Alzheimer's disease Sister   . Diabetes Sister   . Stroke Mother   . Dementia Mother   . Diabetes Mother   . Cancer Father        colon  . Stroke Father   . Obesity Daughter   . Diabetes Daughter   . Hypertension Daughter     Social History Social History   Tobacco Use  . Smoking status: Former Smoker    Packs/day: 1.75    Years: 40.00    Pack years: 70.00    Types: Cigarettes    Last attempt to quit: 03/01/2004    Years since quitting: 14.6  . Smokeless tobacco: Never Used  Substance Use Topics  . Alcohol use: No  . Drug use: No     Allergies   Doxycycline   Review of Systems Review of Systems  Constitutional: Negative for fever.  Respiratory: Negative for shortness of breath.   Cardiovascular: Positive for palpitations. Negative for chest pain.  Neurological: Negative for syncope.  All other systems reviewed and are negative.    Physical Exam Updated Vital Signs BP 117/74   Pulse 88   Temp 97.9 F (36.6 C) (Oral)   Resp 20   Ht 1.803 m (5\' 11" )   Wt 77.1 kg   SpO2 97%   BMI 23.71 kg/m   Physical Exam CONSTITUTIONAL: elderly, no acute distress HEAD: Normocephalic/atraumatic EYES: EOMI/PERRL ENMT: Mucous membranes moist NECK: supple no meningeal signs SPINE/BACK:entire spine nontender CV: S1/S2 noted, no murmurs/rubs/gallops noted LUNGS: Lungs are clear to auscultation bilaterally, no apparent distress  ABDOMEN: soft, nontender, no rebound or guarding, bowel sounds noted throughout abdomen GU:no cva tenderness NEURO: Pt is awake/alert/appropriate, moves all extremitiesx4.  No facial droop.   EXTREMITIES: pulses normal/equal, full ROM SKIN: warm, color normal PSYCH: no abnormalities of mood noted, alert and oriented to situation  ED Treatments / Results  Labs (all labs ordered are listed, but only abnormal results are displayed) Labs Reviewed  BASIC METABOLIC PANEL - Abnormal; Notable for the following components:      Result Value   Glucose, Bld 109 (*)    BUN 25 (*)    Creatinine, Ser 1.46 (*)    GFR calc non Af Amer 47 (*)    GFR calc Af Amer 55 (*)    All other components within normal limits  CBC - Abnormal;  Notable for the following components:   WBC 19.4 (*)    All other components within normal limits  MAGNESIUM    EKG EKG Interpretation  Date/Time:  Monday October 09 2018 23:27:18 EDT Ventricular Rate:  98 PR Interval:    QRS Duration: 89 QT Interval:  354 QTC Calculation: 452 R Axis:   -52 Text Interpretation:  Sinus rhythm Left anterior fascicular block Minimal ST elevation, anterior leads Interpretation limited secondary to artifact Confirmed by Zadie Rhine ([id number]) on 10/09/2018 11:48:00 PM         Radiology No results found.  Procedures Procedures   Medications Ordered in ED Medications  apixaban (ELIQUIS) tablet 5 mg (5 mg Oral Given 10/10/18 0143)     Initial Impression / Assessment and Plan / ED Course  I have reviewed the triage vital signs and the nursing notes.  Pertinent labs  results that were available during my care of the patient were reviewed by me and considered in my medical decision making (see chart for details).        This patients CHA2DS2-VASc Score and unadjusted Ischemic Stroke Rate (% per year) is equal to 2.2 % stroke rate/year from a score of 2  Above score calculated as 1 point each if present [CHF, HTN, DM,  Vascular=MI/PAD/Aortic Plaque, Age if 65-74, or Male] Above score calculated as 2 points each if present [Age > 75, or Stroke/TIA/TE]   1:21 AM Patient presents for palpitations that has since resolved.  EMS EKG does appear to be consistent with atrial fibrillation.  His current rhythm is sinus. He will continue his beta-blocker at home.  He will be started on oral anticoagulant and refer to cardiology. He is now at baseline with no acute complaints.  He denies any chest pain.    Patient felt improved in the  emergency department.  I reviewed the telemetry monitoring, and there were no signs of any runs of atrial fibrillation.  He continues to be in sinus rhythm.  However his prehospital EKG did show signs of atrial fibrillation.  He is high risk, therefore anticoagulation will be started.  He otherwise feels well. He has been referred to cardiology Final Clinical Impressions(s) / ED Diagnoses   Final diagnoses:  Paroxysmal atrial fibrillation [facility])    ED Discharge Orders         Ordered    apixaban (ELIQUIS) 5 MG TABS tablet  2 times daily     10/10/18 0143    Amb referral to AFIB Clinic     10/09/18 2347           Zadie Rhine, MD 10/10/18 0225

## 2018-10-10 NOTE — Telephone Encounter (Signed)
Noted and agree.  He was seen in the ER and was in A fib.   Excellent telephone management by Dr. Wonda Olds.

## 2018-10-10 NOTE — ED Notes (Signed)
Called pharmacy, requested that they come and educate first time Eliquis user.

## 2018-10-12 ENCOUNTER — Telehealth (HOSPITAL_COMMUNITY): Payer: Self-pay | Admitting: *Deleted

## 2018-10-12 ENCOUNTER — Ambulatory Visit (HOSPITAL_COMMUNITY): Payer: Non-veteran care

## 2018-10-13 ENCOUNTER — Other Ambulatory Visit: Payer: Self-pay

## 2018-10-13 ENCOUNTER — Ambulatory Visit (HOSPITAL_COMMUNITY)
Admission: RE | Admit: 2018-10-13 | Discharge: 2018-10-13 | Disposition: A | Discharge status: Home / Self Care | Payer: Medicare Other | Source: Ambulatory Visit | Attending: Nurse Practitioner | Admitting: Nurse Practitioner

## 2018-10-13 ENCOUNTER — Encounter (HOSPITAL_COMMUNITY): Payer: Self-pay | Admitting: Nurse Practitioner

## 2018-10-13 DIAGNOSIS — Z7901 Long term (current) use of anticoagulants: Secondary | ICD-10-CM

## 2018-10-13 DIAGNOSIS — Z79899 Other long term (current) drug therapy: Secondary | ICD-10-CM

## 2018-10-13 DIAGNOSIS — I4891 Unspecified atrial fibrillation: Secondary | ICD-10-CM

## 2018-10-13 NOTE — Progress Notes (Signed)
Electrophysiology TeleHealth Note   Due to national recommendations of social distancing due to COVID 19,video telehealth visit is felt to be most appropriate for this patient at this time.  See MyChart message/consent below from today for patient consent regarding telehealth for the Atrial Fibrillation Clinic.    Date:  10/13/2018   ID:  Michael Lund., DOB 02-20-1946, MRN [id number]  Location: home Provider location: [address] [place], [place] [address] Evaluation Performed: New patient consult  PCP:  Moses Manners, MD  Primary Cardiologist:  [facility] Primary Electrophysiologist: none  CC:New onset afib    History of Present Illness: Michael Iturralde. is a 73 y.o. male with h/o  COPD, HTN who presents via audio/video conferencing for a telehealth visit today. The patient is referred for new consultation regarding new onset afib per [facility] ER. He presented in SR, although EMS did document afib in the unit. He was having a flare of hs COPD and was taking prednisone at the time of onset. He watched his HR jump around for a few hours on his pulse ox, up to 160 bpm, per pt and thought to take an extra BB, which got him back into SR after 2 hours of taking. He as placed on eliquis 5 mg bid.   He looks well on video today and his lung basis is back to his norm. He does wear O2 via [place]. He does not leave the house unless very necessary and is very cautious now with Covid 19. His wife is a retired Engineer, civil (consulting). He has not had any further issues with irregular heart beat. EMS strips reviewed and do show afib. He does see a cardiologist at times st the [place] and reports an echo 3 months ago and was told things were stable. He is taking the eliquis 5 mg bid prescribed at the ER.  Today, he denies symptoms of palpitations, chest pain, , orthopnea, PND, lower extremity edema, claudication, dizziness, presyncope, syncope, bleeding, or neurologic sequela.  He does have chronic shortness of breath.The patient  is tolerating medications without difficulties and is otherwise without complaint today.   he denies symptoms of worsening  cough, fevers, chills, or new SOB worrisome for COVID 19.    Atrial Fibrillation Risk Factors:  he does not have symptoms or diagnosis of sleep apnea. .he does not have a history of rheumatic fever. he does not have a history of alcohol use. The patient does not have a history of early familial atrial fibrillation or other arrhythmias.   Past Medical History:  Diagnosis Date   Chronic kidney disease    COPD (chronic obstructive pulmonary disease) (HCC)    GERD (gastroesophageal reflux disease)    Hyperlipidemia    Hypertension    Insomnia    No past surgical history on file.   Current Outpatient Medications  Medication Sig Dispense Refill   apixaban (ELIQUIS) 5 MG TABS tablet Take 1 tablet (5 mg total) by mouth 2 (two) times daily for 30 days. 60 tablet 0   azithromycin (ZITHROMAX) 250 MG tablet Take 1 tablet (250 mg total) by mouth daily. 90 tablet 3   bisoprolol (ZEBETA) 5 MG tablet TAKE 1 TABLET DAILY (Patient taking differently: Take 5 mg by mouth daily. ) 90 tablet 3   cholecalciferol (VITAMIN D) 1000 units tablet Take 1,000 Units by mouth daily.     fluticasone (FLONASE) 50 MCG/ACT nasal spray USE 2 SPRAYS NASALLY DAILY (Patient taking differently: Place 2 sprays  into both nostrils daily. ) 32 g 2   hydrochlorothiazide (HYDRODIURIL) 25 MG tablet TAKE 1 TABLET DAILY (Patient taking differently: Take 25 mg by mouth daily. ) 90 tablet 4   losartan (COZAAR) 50 MG tablet Take 1 tablet (50 mg total) by mouth daily. 90 tablet 3   mometasone (ASMANEX) 220 MCG/INH inhaler Inhale 2 puffs into the lungs at bedtime.     Multiple Vitamins-Minerals (CENTRUM SILVER ADULT 50+ PO) Take 1 tablet by mouth daily.      omeprazole (PRILOSEC) 40 MG capsule TAKE 1 CAPSULE DAILY (Patient taking differently: Take 40 mg by mouth daily. ) 90 capsule 3   pravastatin  (PRAVACHOL) 40 MG tablet TAKE 1 TABLET EVERY EVENING (Patient taking differently: Take 40 mg by mouth every evening. ) 90 tablet 4   predniSONE (DELTASONE) 20 MG tablet Take 1 tablet (20 mg total) by mouth 3 (three) times daily. (Patient not taking: Reported on 10/10/2018) 15 tablet 12   sildenafil (VIAGRA) 100 MG tablet TAKE 1 TABLET AS NEEDED FOR ERECTILE DYSFUNCTION (Patient taking differently: Take 100 mg by mouth as needed for erectile dysfunction. ) 18 tablet 2   tamsulosin (FLOMAX) 0.4 MG CAPS capsule TAKE 1 CAPSULE DAILY FOR PROSTATE (Patient taking differently: Take 0.4 mg by mouth daily. ) 90 capsule 4   Tiotropium Bromide-Olodaterol (STIOLTO RESPIMAT) 2.5-2.5 MCG/ACT AERS Inhale 2 puffs into the lungs daily.     traZODone (DESYREL) 100 MG tablet TAKE 1 TABLET AT BEDTIME (Patient taking differently: Take 100 mg by mouth at bedtime. ) 90 tablet 3   VENTOLIN HFA 108 (90 Base) MCG/ACT inhaler USE 2 INHALATIONS EVERY 4 HOURS AS NEEDED. RESCUE FOR SHORTNESS OF BREATH (Patient taking differently: Inhale 2 puffs into the lungs every 4 (four) hours as needed for wheezing. ) 54 g 1   VIRTUSSIN A/C 100-10 MG/5ML syrup TAKE 5 ML BY MOUTH  TWICE DAILY AS NEEDED FOR COUGH (Patient not taking: Reported on 10/10/2018) 120 mL 4   No current facility-administered medications for this encounter.     Allergies:   Doxycycline   Social History:  The patient  reports that he quit smoking about 14 years ago. His smoking use included cigarettes. He has a 70.00 pack-year smoking history. He has never used smokeless tobacco. He reports that he does not drink alcohol or use drugs.   Family History:  The patient's  family history includes Alzheimer's disease in his sister; Cancer in his father; Dementia in his mother; Diabetes in his daughter, mother, and sister; Hypertension in his daughter; Obesity in his daughter; Stroke in his father and mother.    ROS:  Please see the history of present illness.   All  other systems are personally reviewed and negative.   Exam: Well appearing, alert and conversant, regular work of breathing,  good skin color, wearing O2 via nasal cannula  Recent Labs: 10/09/2018: BUN 25; Creatinine, Ser 1.46; Hemoglobin 15.5; Magnesium 2.1; Platelets 396; Potassium 3.6; Sodium 137  personally reviewed    Other studies personally reviewed: Additional studies/ records that were reviewed today include:ER visit, PCP notes, Labs, cxr, EMS strips (afib), EKG's (SR with LAFB  Prior radiographs: IMPRESSION: Severe emphysematous changes with increased interstitial densities at the lung bases. Findings raise concern for parenchymal lung disease and cannot exclude infection at the lung bases   Echo- per pt done at [place] 3 months ago and no specific concerns relayed to him      ASSESSMENT AND PLAN:  1. New onset  afib atrial fibrillation Now in SR In the setting of COPD exacerbation and prednisone use General education re afib and triggers  He was reminded that extra corgard in case afib returns(as he did initially) would be his first line to try to restore SR Continue Zebeta  daly   This patients CHA2DS2-VASc Score and unadjusted Ischemic Stroke Rate (% per year) is equal to 2.2 % stroke rate/year from a score of 2  Above score calculated as 1 point each if present [CHF, HTN, DM, Vascular=MI/PAD/Aortic Plaque, Age if 65-74, or Male] Above score calculated as 2 points each if present [Age > 75, or Stroke/TIA/TE]  Continue eliquis 5 mg bid Bleeding precautions discussed    COVID screen The patient does not have any symptoms that suggest any further testing/ screening at this time.  Social distancing reinforced today.    Follow-up:  Per [facility] or [facility] as needed  Current medicines are reviewed at length with the patient today.   The patient does not have concerns regarding his medicines.  The following changes were made today:  none  Labs/  tests ordered today include :none No orders of the defined types were placed in this encounter.   Patient Risk:  after full review of this patients clinical status, I feel that they are at high risk at this time.   Today, I have spent 25  minutes with the patient with telehealth technology discussing  New onset afib and anticoagulation .    Michael Perking NP  10/13/2018 11:38 AM  Afib Clinic [facility] [address] [place], [place] [address] [phone]   I hereby voluntarily request, consent and authorize the Atrial Fibrillation Clinic and its employed or contracted physicians, physician assistants, nurse practitioners or other licensed health care professionals (the Practitioner), to provide me with telemedicine health care services (the "Services") as deemed necessary by the treating Practitioner. I acknowledge and consent to receive the Services by the Practitioner via telemedicine. I understand that the telemedicine visit will involve communicating with the Practitioner through live audiovisual communication technology and the disclosure of certain medical information by electronic transmission. I acknowledge that I have been given the opportunity to request an in-person assessment or other available alternative prior to the telemedicine visit and am voluntarily participating in the telemedicine visit.   I understand that I have the right to withhold or withdraw my consent to the use of telemedicine in the course of my care at any time, without affecting my right to future care or treatment, and that the Practitioner or I may terminate the telemedicine visit at any time. I understand that I have the right to inspect all information obtained and/or recorded in the course of the telemedicine visit and may receive copies of available information for a reasonable fee.  I understand that some of the potential risks of receiving the Services via telemedicine include:   Delay  or interruption in medical evaluation due to technological equipment failure or disruption;  Information transmitted may not be sufficient (e.g. poor resolution of images) to allow for appropriate medical decision making by the Practitioner; and/or  In rare instances, security protocols could fail, causing a breach of personal health information.   Furthermore, I acknowledge that it is my responsibility to provide information about my medical history, conditions and care that is complete and accurate to the best of my ability. I acknowledge that Practitioner's advice, recommendations, and/or decision may be based on factors not within their  control, such as incomplete or inaccurate data provided by me or distortions of diagnostic images or specimens that may result from electronic transmissions. I understand that the practice of medicine is not an exact science and that Practitioner makes no warranties or guarantees regarding treatment outcomes. I acknowledge that I will receive a copy of this consent concurrently upon execution via email to the email address I last provided but may also request a printed copy by calling the office of the Atrial Fibrillation Clinic.  I understand that my insurance will be billed for this visit.   I have read or had this consent read to me.  I understand the contents of this consent, which adequately explains the benefits and risks of the Services being provided via telemedicine.  I have been provided ample opportunity to ask questions regarding this consent and the Services and have had my questions answered to my satisfaction.  I give my informed consent for the services to be provided through the use of telemedicine in my medical care  By participating in this telemedicine visit I agree to the above.

## 2018-10-17 ENCOUNTER — Ambulatory Visit (HOSPITAL_COMMUNITY): Payer: Non-veteran care

## 2018-10-19 ENCOUNTER — Ambulatory Visit (HOSPITAL_COMMUNITY): Payer: Non-veteran care

## 2018-10-24 ENCOUNTER — Ambulatory Visit (HOSPITAL_COMMUNITY): Payer: Non-veteran care

## 2018-10-26 ENCOUNTER — Ambulatory Visit (HOSPITAL_COMMUNITY): Payer: Non-veteran care

## 2018-10-30 ENCOUNTER — Other Ambulatory Visit: Payer: Self-pay

## 2018-10-30 MED ORDER — FLUTICASONE PROPIONATE 50 MCG/ACT NA SUSP: NASAL | 2 refills | Status: DC

## 2018-10-30 MED ORDER — APIXABAN 5 MG PO TABS: 5.0000 mg | ORAL_TABLET | Freq: Two times a day (BID) | ORAL | 3 refills | Status: DC

## 2018-10-30 NOTE — Telephone Encounter (Signed)
Pt called nurse line stating he was recently seen in ED for Afib. Pt stated he was started on Eliquis and given 1 month supply. Pt isn't sure if he is to continue this medication, if so he needs a refill sent to his pharmacy. Please advise.

## 2018-10-31 ENCOUNTER — Ambulatory Visit (HOSPITAL_COMMUNITY): Payer: Non-veteran care

## 2018-10-31 ENCOUNTER — Telehealth (HOSPITAL_COMMUNITY): Payer: Self-pay

## 2018-11-02 ENCOUNTER — Ambulatory Visit (HOSPITAL_COMMUNITY): Payer: Non-veteran care

## 2018-11-07 ENCOUNTER — Ambulatory Visit (HOSPITAL_COMMUNITY): Payer: Non-veteran care

## 2018-11-09 ENCOUNTER — Ambulatory Visit (HOSPITAL_COMMUNITY): Payer: Non-veteran care

## 2018-11-10 ENCOUNTER — Other Ambulatory Visit: Payer: Self-pay | Admitting: Family Medicine

## 2018-11-10 ENCOUNTER — Other Ambulatory Visit: Payer: Self-pay | Admitting: *Deleted

## 2018-11-10 DIAGNOSIS — I10 Essential (primary) hypertension: Secondary | ICD-10-CM

## 2018-11-10 MED ORDER — FLUTICASONE PROPIONATE 50 MCG/ACT NA SUSP: NASAL | 2 refills | Status: DC

## 2018-11-10 MED ORDER — LOSARTAN POTASSIUM 50 MG PO TABS: 50.0000 mg | ORAL_TABLET | Freq: Every day | ORAL | 1 refills | Status: DC

## 2018-11-10 NOTE — Telephone Encounter (Signed)
Pt has canceled these meds @ [facility].  [facility]. Jone Baseman, CMA

## 2018-11-14 ENCOUNTER — Ambulatory Visit (HOSPITAL_COMMUNITY): Payer: Non-veteran care

## 2018-11-16 ENCOUNTER — Ambulatory Visit (HOSPITAL_COMMUNITY): Payer: Non-veteran care

## 2018-11-21 ENCOUNTER — Ambulatory Visit (HOSPITAL_COMMUNITY): Payer: Non-veteran care

## 2018-11-23 ENCOUNTER — Ambulatory Visit (HOSPITAL_COMMUNITY): Payer: Non-veteran care

## 2018-11-28 ENCOUNTER — Ambulatory Visit (HOSPITAL_COMMUNITY): Payer: Non-veteran care

## 2018-11-30 ENCOUNTER — Ambulatory Visit (HOSPITAL_COMMUNITY): Payer: Non-veteran care

## 2018-12-05 ENCOUNTER — Ambulatory Visit (HOSPITAL_COMMUNITY): Payer: Non-veteran care

## 2018-12-07 ENCOUNTER — Ambulatory Visit (HOSPITAL_COMMUNITY): Payer: Non-veteran care

## 2018-12-12 ENCOUNTER — Ambulatory Visit (HOSPITAL_COMMUNITY): Payer: Non-veteran care

## 2018-12-14 ENCOUNTER — Ambulatory Visit (HOSPITAL_COMMUNITY): Payer: Non-veteran care

## 2018-12-19 ENCOUNTER — Ambulatory Visit (HOSPITAL_COMMUNITY): Payer: Non-veteran care

## 2018-12-21 ENCOUNTER — Ambulatory Visit (HOSPITAL_COMMUNITY): Payer: Non-veteran care

## 2018-12-26 ENCOUNTER — Ambulatory Visit (HOSPITAL_COMMUNITY): Payer: Non-veteran care

## 2018-12-28 ENCOUNTER — Ambulatory Visit (HOSPITAL_COMMUNITY): Payer: Non-veteran care

## 2019-01-02 ENCOUNTER — Ambulatory Visit (HOSPITAL_COMMUNITY): Payer: Non-veteran care

## 2019-01-04 ENCOUNTER — Ambulatory Visit (HOSPITAL_COMMUNITY): Payer: Non-veteran care

## 2019-01-09 ENCOUNTER — Ambulatory Visit (HOSPITAL_COMMUNITY): Payer: Non-veteran care

## 2019-01-11 ENCOUNTER — Ambulatory Visit (HOSPITAL_COMMUNITY): Payer: Non-veteran care

## 2019-01-13 ENCOUNTER — Other Ambulatory Visit: Payer: Self-pay | Admitting: Family Medicine

## 2019-01-13 DIAGNOSIS — N529 Male erectile dysfunction, unspecified: Secondary | ICD-10-CM

## 2019-01-16 ENCOUNTER — Ambulatory Visit (HOSPITAL_COMMUNITY): Payer: Non-veteran care

## 2019-01-18 ENCOUNTER — Ambulatory Visit (HOSPITAL_COMMUNITY): Payer: Non-veteran care

## 2019-01-23 ENCOUNTER — Ambulatory Visit (HOSPITAL_COMMUNITY): Payer: Non-veteran care

## 2019-01-25 ENCOUNTER — Ambulatory Visit (HOSPITAL_COMMUNITY): Payer: Non-veteran care

## 2019-01-30 ENCOUNTER — Ambulatory Visit (HOSPITAL_COMMUNITY): Payer: Non-veteran care

## 2019-02-01 ENCOUNTER — Ambulatory Visit (HOSPITAL_COMMUNITY): Payer: Non-veteran care

## 2019-02-06 ENCOUNTER — Ambulatory Visit (HOSPITAL_COMMUNITY): Payer: Non-veteran care

## 2019-02-08 ENCOUNTER — Telehealth (HOSPITAL_COMMUNITY): Payer: Self-pay | Admitting: *Deleted

## 2019-02-08 ENCOUNTER — Ambulatory Visit (HOSPITAL_COMMUNITY): Payer: Non-veteran care

## 2019-02-08 NOTE — Telephone Encounter (Signed)
I was able to reach Michael Robertson today to discuss the pulmonary maintenance program. I informed him that the maintenance program has remained closed due to COVID at present. I explained that we would keep him update as further decisions are made.

## 2019-02-13 ENCOUNTER — Ambulatory Visit (HOSPITAL_COMMUNITY): Payer: Non-veteran care

## 2019-02-15 ENCOUNTER — Ambulatory Visit (HOSPITAL_COMMUNITY): Payer: Non-veteran care

## 2019-02-20 ENCOUNTER — Ambulatory Visit (HOSPITAL_COMMUNITY): Payer: Non-veteran care

## 2019-02-21 ENCOUNTER — Other Ambulatory Visit: Payer: Self-pay

## 2019-02-21 ENCOUNTER — Ambulatory Visit (INDEPENDENT_AMBULATORY_CARE_PROVIDER_SITE_OTHER): Payer: Medicare Other

## 2019-02-21 VITALS — BP 131/65 | Ht 71.0 in | Wt 170.0 lb

## 2019-02-21 DIAGNOSIS — Z0000 Encounter for general adult medical examination without abnormal findings: Secondary | ICD-10-CM

## 2019-02-21 NOTE — Progress Notes (Signed)
Subjective:   Michael Thum. is a 73 y.o. male who presents for Medicare Annual preventive examination.  The patient consented to a virtual visit.   Review of Systems: Defer to PCP  Objective:    Vitals: BP 131/65 Comment: patient reported (telemed visit)  Ht '5\' 11"'  (1.803 m)   Wt 170 lb (77.1 kg)   BMI 23.71 kg/m   Body mass index is 23.71 kg/m.  Advanced Directives 02/21/2019 04/05/2018 11/23/2017 04/14/2017 11/17/2016 10/28/2016 04/07/2016  Does Patient Have a Medical Advance Directive? No No No No Yes Yes No  Type of Advance Directive - - - - Living will Living will -  Does patient want to make changes to medical advance directive? - - - - - No - Patient declined -  Copy of [place] in [place]  Would patient like information on creating a medical advance directive? Yes (MAU/Ambulatory/Procedural Areas - Information given) No - Patient declined No - Patient declined No - Patient declined - - Yes - Scientist, clinical (histocompatibility and immunogenetics) given    Tobacco Social History   Tobacco Use  Smoking Status Former Smoker  . Packs/day: 1.75  . Years: 40.00  . Pack years: 70.00  . Types: Cigarettes  . Quit date: 03/01/2004  . Years since quitting: 14.9  Smokeless Tobacco Never Used      Clinical Intake:  Pre-visit preparation completed: Yes  Pain Score: 0-No pain  How often do you need to have someone help you when you read instructions, pamphlets, or other written materials from your doctor or pharmacy?: 2 - Rarely What is the last grade level you completed in school?: College  Interpreter Needed?: No   Past Medical History:  Diagnosis Date  . Chronic kidney disease   . COPD (chronic obstructive pulmonary disease) ([place])   . GERD (gastroesophageal reflux disease)   . Hyperlipidemia   . Hypertension   . Insomnia   . 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 Family History  Problem Relation Age of Onset  . Alzheimer's  disease Sister   . Diabetes Sister   . Dementia Sister   . Stroke Mother   . Dementia Mother   . Diabetes Mother   . Cancer Father        colon  . Stroke Father   . Obesity Daughter   . Diabetes Daughter   . Hypertension Daughter   . Stroke Son    Social History   Socioeconomic History  . Marital status: Married    Spouse name: Rosaria Ferries   . Number of children: 3  . Years of education: 2 college  . Highest education level: Not on file  Occupational History  . Occupation: Retired- Actor: RETIRED  . Occupation: RetiredMagazine features editor  . Financial resource strain: Not hard at all  . Food insecurity    Worry: Never true    Inability: Never true  . Transportation needs    Medical: No    Non-medical: No  Tobacco Use  . Smoking status: Former Smoker    Packs/day: 1.75    Years: 40.00    Pack years: 70.00    Types: Cigarettes    Quit date: 03/01/2004    Years since quitting: 14.9  . Smokeless tobacco: Never Used  Substance and Sexual Activity  . Alcohol use: No  . Drug use: No  . Sexual  activity: Yes  Lifestyle  . Physical activity    Days per week: 0 days    Minutes per session: 0 min  . Stress: Only a little  Relationships  . Social connections    Talks on phone: More than three times a week    Gets together: Once a week    Attends religious service: Never    Active member of club or organization: No    Attends meetings of clubs or organizations: Never    Relationship status: Married  Other Topics Concern  . Not on file  Social History Narrative   Patient lives in [place] with his wife Rosaria Ferries.    Patient has 3 children. A daughter who lives in [place], a son in [place], and another daughter he has never met.    Patient has a dog named Simba.    Patient enjoys watching TV, hanging with his dog, and visiting family on Sundays.     Outpatient Encounter Medications as of 02/21/2019  Medication Sig  . apixaban (ELIQUIS) 5 MG TABS tablet  Take 1 tablet (5 mg total) by mouth 2 (two) times daily.  Marland Kitchen azithromycin (ZITHROMAX) 250 MG tablet Take 1 tablet (250 mg total) by mouth daily.  . bisoprolol (ZEBETA) 5 MG tablet TAKE 1 TABLET DAILY (Patient taking differently: Take 5 mg by mouth daily. )  . cholecalciferol (VITAMIN D) 1000 units tablet Take 1,000 Units by mouth daily.  . fluticasone (FLONASE) 50 MCG/ACT nasal spray USE 2 SPRAYS NASALLY DAILY  . hydrochlorothiazide (HYDRODIURIL) 25 MG tablet TAKE 1 TABLET DAILY (Patient taking differently: Take 25 mg by mouth daily. )  . losartan (COZAAR) 50 MG tablet Take 1 tablet (50 mg total) by mouth daily.  . mometasone (ASMANEX) 220 MCG/INH inhaler Inhale 2 puffs into the lungs at bedtime.  . Multiple Vitamins-Minerals (CENTRUM SILVER ADULT 50+ PO) Take 1 tablet by mouth daily.   Marland Kitchen omeprazole (PRILOSEC) 40 MG capsule TAKE 1 CAPSULE DAILY  . pravastatin (PRAVACHOL) 40 MG tablet TAKE 1 TABLET EVERY EVENING (Patient taking differently: Take 40 mg by mouth every evening. )  . tamsulosin (FLOMAX) 0.4 MG CAPS capsule TAKE 1 CAPSULE DAILY FOR PROSTATE (Patient taking differently: Take 0.4 mg by mouth daily. )  . Tiotropium Bromide-Olodaterol (STIOLTO RESPIMAT) 2.5-2.5 MCG/ACT AERS Inhale 2 puffs into the lungs daily.  . traZODone (DESYREL) 100 MG tablet TAKE 1 TABLET AT BEDTIME (Patient taking differently: Take 100 mg by mouth at bedtime. )  . VENTOLIN HFA 108 (90 Base) MCG/ACT inhaler USE 2 INHALATIONS EVERY 4 HOURS AS NEEDED. RESCUE FOR SHORTNESS OF BREATH (Patient taking differently: Inhale 2 puffs into the lungs every 4 (four) hours as needed for wheezing. )  . VIRTUSSIN A/C 100-10 MG/5ML syrup TAKE 5 ML BY MOUTH  TWICE DAILY AS NEEDED FOR COUGH  . sildenafil (VIAGRA) 100 MG tablet TAKE 1 TABLET AS NEEDED FOR ERECTILE DYSFUNCTION  . [DISCONTINUED] predniSONE (DELTASONE) 20 MG tablet Take 1 tablet (20 mg total) by mouth 3 (three) times daily. (Patient not taking: Reported on 10/10/2018)   No  facility-administered encounter medications on file as of 02/21/2019.     Activities of Daily Living In your present state of health, do you have any difficulty performing the following activities: 02/21/2019  Hearing? N  Vision? N  Difficulty concentrating or making decisions? N  Walking or climbing stairs? N  Dressing or bathing? N  Doing errands, shopping? N  Preparing Food and eating ? N  Using the Toilet?  N  In the past six months, have you accidently leaked urine? N  Do you have problems with loss of bowel control? N  Managing your Medications? N  Managing your Finances? N  Housekeeping or managing your Housekeeping? N  Some recent data might be hidden    Patient Care Team: Zenia Resides, MD as PCP - General   Assessment:   This is a routine wellness examination for Michael Robertson.  Exercise Activities and Dietary recommendations Patient has an elliptical at home. Patient and his wife have made a "pact" to work out [employer] for 15 minutes each time. Barriers-patient has COPD and becomes SOB easily. Precautions give will working out.   Goals    . Exercise 3x per week (15 min per time)     15 minutes to start off using home elliptical machine.  Will assess at next visit and consider increasing minutes.    . Weight < 160 lb (72.576 kg)       Fall Risk Fall Risk  02/21/2019 10/02/2018 04/05/2018 04/05/2018 04/03/2018  Falls in the past year? 0 0 No No -  Number falls in past yr: - 0 - - -  Injury with Fall? - 0 - - -  Risk for fall due to : - - - - History of fall(s);Impaired vision  Follow up - Falls evaluation completed - - -   Is the patient's home free of loose throw rugs in walkways, pet beds, electrical cords, etc?   yes      Grab bars in the bathroom? no      Handrails on the stairs?   yes      Adequate lighting?   yes  Patient rating of health (0-10): 8  Depression Screen PHQ 2/9 Scores 02/21/2019 10/02/2018 07/26/2018 04/05/2018  PHQ - 2 Score 0 0 0 0  PHQ- 9 Score - - 1 -     Cognitive Function MMSE - Mini Mental State Exam 03/02/2011  Orientation to time 5  Orientation to Place 5  Registration 3  Attention/ Calculation 5  Recall 3  Language- name 2 objects 2  Language- repeat 1  Language- follow 3 step command 3  Language- read & follow direction 1  Write a sentence 1  Copy design 0  Total score 29     6CIT Screen 02/21/2019  What Year? 0 points  What month? 0 points  What time? 0 points  Count back from 20 0 points  Months in reverse 2 points  Repeat phrase 0 points  Total Score 2    Immunization History  Administered Date(s) Administered  . Influenza Split 04/09/2011, 04/04/2012  . Influenza Whole 06/12/2007, 05/06/2008, 05/20/2009, 04/17/2010  . Influenza,inj,Quad PF,6+ Mos 04/06/2013, 04/10/2014, 04/03/2015, 04/07/2016, 04/14/2017, 04/05/2018  . Pneumococcal Conjugate-13 04/06/2013  . Pneumococcal Polysaccharide-23 06/19/2003, 10/08/2009, 04/04/2015  . Td 07/20/2003  . Tdap 09/28/2013    Screening Tests Health Maintenance  Topic Date Due  . OPHTHALMOLOGY EXAM  10/27/1955  . HEMOGLOBIN A1C  10/04/2018  . FOOT EXAM  11/24/2018  . INFLUENZA VACCINE  02/17/2019  . COLONOSCOPY  09/04/2019  . TETANUS/TDAP  09/29/2023  . Hepatitis C Screening  Completed  . PNA vac Low Risk Adult  Completed   Cancer Screenings: Lung: Low Dose CT Chest recommended if Age 52-80 years, 30 pack-year currently smoking OR have quit w/in 15years. Patient does not qualify. Colorectal: Due February 2021  Additional Screenings: Hepatitis C: Completed       Plan:  Look over  the advanced directive packet you have with your wife. Once completed we will notarize for you.  Start using the elliptical machine 3 days a week for 15 minutes each time. Be mindful if you become short of breath.   I have personally reviewed and noted the following in the patient's chart:   . Medical and social history . Use of alcohol, tobacco or illicit drugs  . Current  medications and supplements . Functional ability and status . Nutritional status . Physical activity . Advanced directives . List of other physicians . Hospitalizations, surgeries, and ER visits in previous 12 months . Vitals . Screenings to include cognitive, depression, and falls . Referrals and appointments  In addition, I have reviewed and discussed with patient certain preventive protocols, quality metrics, and best practice recommendations. A written personalized care plan for preventive services as well as general preventive health recommendations were provided to patient.  This visit was conducted virtually in the setting of the [place] pandemic.    Dorna Bloom, [place]  02/21/2019

## 2019-02-21 NOTE — Progress Notes (Signed)
Patient ID: Michael Robertson., male   DOB: [birth year], 73 y.o.   MRN: [id number]  I have reviewed this visit and agree with the documentation.

## 2019-02-21 NOTE — Patient Instructions (Addendum)
You spoke to Michael Robertson, [place] over the phone for your annual wellness visit.  We discussed goals: Goals    . Exercise 3x per week (15 min per time)     15 minutes to start off using home elliptical machine.  Will assess at next visit and consider increasing minutes.    . Weight < 160 lb (72.576 kg)      We also discussed recommended health maintenance. As discussed, you are due for a flu vaccine and diabetes followup. I have scheduled you an apt with Dr. Andria Frames for 9/24 '@1030am' .  Health Maintenance  Topic Date Due  . OPHTHALMOLOGY EXAM  [birth year]27/1955  . HEMOGLOBIN A1C  10/04/2018  . FOOT EXAM  11/24/2018  . INFLUENZA VACCINE  02/17/2019  . COLONOSCOPY  09/04/2019  . TETANUS/TDAP  09/29/2023  . Hepatitis C Screening  Completed  . PNA vac Low Risk Adult  Completed    We also discussed starting to work out 3x a week for 15 minutes each time. Be mindful if you start to become short of breath.  Complete the advanced directive packet and bring to next office visit.   Preventive Care 48 Years and Older, Male Preventive care refers to lifestyle choices and visits with your health care provider that can promote health and wellness. This includes:  A yearly physical exam. This is also called an annual well check.  Regular dental and eye exams.  Immunizations.  Screening for certain conditions.  Healthy lifestyle choices, such as diet and exercise. What can I expect for my preventive care visit? Physical exam Your health care provider will check:  Height and weight. These may be used to calculate body mass index (BMI), which is a measurement that tells if you are at a healthy weight.  Heart rate and blood pressure.  Your skin for abnormal spots. Counseling Your health care provider may ask you questions about:  Alcohol, tobacco, and drug use.  Emotional well-being.  Home and relationship well-being.  Sexual activity.  Eating habits.  History of falls.  Memory  and ability to understand (cognition).  Work and work Statistician. What immunizations do I need?  Influenza (flu) vaccine  This is recommended every year. Tetanus, diphtheria, and pertussis (Tdap) vaccine  You may need a Td booster every 10 years. Varicella (chickenpox) vaccine  You may need this vaccine if you have not already been vaccinated. Zoster (shingles) vaccine  You may need this after age 58. Pneumococcal conjugate (PCV13) vaccine  One dose is recommended after age 49. Pneumococcal polysaccharide (PPSV23) vaccine  One dose is recommended after age 39. Measles, mumps, and rubella (MMR) vaccine  You may need at least one dose of MMR if you were born in 1957 or later. You may also need a second dose. Meningococcal conjugate (MenACWY) vaccine  You may need this if you have certain conditions. Hepatitis A vaccine  You may need this if you have certain conditions or if you travel or work in places where you may be exposed to hepatitis A. Hepatitis B vaccine  You may need this if you have certain conditions or if you travel or work in places where you may be exposed to hepatitis B. Haemophilus influenzae type b (Hib) vaccine  You may need this if you have certain conditions. You may receive vaccines as individual doses or as more than one vaccine together in one shot (combination vaccines). Talk with your health care provider about the risks and benefits of combination vaccines. What  tests do I need? Blood tests  Lipid and cholesterol levels. These may be checked every 5 years, or more frequently depending on your overall health.  Hepatitis C test.  Hepatitis B test. Screening  Lung cancer screening. You may have this screening every year starting at age 51 if you have a 30-pack-year history of smoking and currently smoke or have quit within the past 15 years.  Colorectal cancer screening. All adults should have this screening starting at age 64 and continuing  until age 60. Your health care provider may recommend screening at age 38 if you are at increased risk. You will have tests every 1-10 years, depending on your results and the type of screening test.  Prostate cancer screening. Recommendations will vary depending on your family history and other risks.  Diabetes screening. This is done by checking your blood sugar (glucose) after you have not eaten for a while (fasting). You may have this done every 1-3 years.  Abdominal aortic aneurysm (AAA) screening. You may need this if you are a current or former smoker.  Sexually transmitted disease (STD) testing. Follow these instructions at home: Eating and drinking  Eat a diet that includes fresh fruits and vegetables, whole grains, lean protein, and low-fat dairy products. Limit your intake of foods with high amounts of sugar, saturated fats, and salt.  Take vitamin and mineral supplements as recommended by your health care provider.  Do not drink alcohol if your health care provider tells you not to drink.  If you drink alcohol: ? Limit how much you have to 0-2 drinks a day. ? Be aware of how much alcohol is in your drink. In the [place] one drink equals one 12 oz bottle of beer (355 mL), one 5 oz glass of wine (148 mL), or one 1 oz glass of hard liquor (44 mL). Lifestyle  Take daily care of your teeth and gums.  Stay active. Exercise for at least 30 minutes on 5 or more days each week.  Do not use any products that contain nicotine or tobacco, such as cigarettes, e-cigarettes, and chewing tobacco. If you need help quitting, ask your health care provider.  If you are sexually active, practice safe sex. Use a condom or other form of protection to prevent STIs (sexually transmitted infections).  Talk with your health care provider about taking a low-dose aspirin or statin. What's next?  Visit your health care provider once a year for a well check visit.  Ask your health care provider how  often you should have your eyes and teeth checked.  Stay up to date on all vaccines. This information is not intended to replace advice given to you by your health care provider. Make sure you discuss any questions you have with your health care provider. Document Released: 08/01/2015 Document Revised: 06/29/2018 Document Reviewed: 06/29/2018 Elsevier Patient Education  [employer].  Here is an example of what a healthy plate looks like:    ? Make half your plate fruits and vegetables.     ? Focus on whole fruits.     ? Vary your veggies.  ? Make half your grains whole grains. -     ? Look for the word "whole" at the beginning of the ingredients list    ? Some whole-grain ingredients include whole oats, whole-wheat flour,        whole-grain corn, whole-grain brown rice, and whole rye.  ? Move to low-fat and fat-free milk or yogurt.  ? Vary  your protein routine. - Meat, fish, poultry (chicken, [place]), eggs, beans (kidney, pinto), dairy.  ? Drink and eat less sodium, saturated fat, and added sugars.   Our clinic's number is [phone]. Please call with questions or concerns about what we discussed today.

## 2019-02-22 ENCOUNTER — Ambulatory Visit (HOSPITAL_COMMUNITY): Payer: Non-veteran care

## 2019-02-27 ENCOUNTER — Ambulatory Visit (HOSPITAL_COMMUNITY): Payer: Non-veteran care

## 2019-03-01 ENCOUNTER — Ambulatory Visit (HOSPITAL_COMMUNITY): Payer: Non-veteran care

## 2019-03-06 ENCOUNTER — Ambulatory Visit (HOSPITAL_COMMUNITY): Payer: Non-veteran care

## 2019-03-08 ENCOUNTER — Ambulatory Visit (HOSPITAL_COMMUNITY): Payer: Non-veteran care

## 2019-04-12 ENCOUNTER — Encounter: Payer: Self-pay | Admitting: Family Medicine

## 2019-04-12 ENCOUNTER — Ambulatory Visit (INDEPENDENT_AMBULATORY_CARE_PROVIDER_SITE_OTHER): Payer: Medicare Other | Admitting: Family Medicine

## 2019-04-12 ENCOUNTER — Other Ambulatory Visit: Payer: Self-pay

## 2019-04-12 VITALS — BP 108/60 | HR 84 | Wt 175.6 lb

## 2019-04-12 DIAGNOSIS — Z23 Encounter for immunization: Secondary | ICD-10-CM | POA: Diagnosis not present

## 2019-04-12 DIAGNOSIS — E119 Type 2 diabetes mellitus without complications: Secondary | ICD-10-CM | POA: Diagnosis not present

## 2019-04-12 DIAGNOSIS — T3695XA Adverse effect of unspecified systemic antibiotic, initial encounter: Secondary | ICD-10-CM | POA: Insufficient documentation

## 2019-04-12 DIAGNOSIS — K521 Toxic gastroenteritis and colitis: Secondary | ICD-10-CM

## 2019-04-12 DIAGNOSIS — K219 Gastro-esophageal reflux disease without esophagitis: Secondary | ICD-10-CM

## 2019-04-12 DIAGNOSIS — J439 Emphysema, unspecified: Secondary | ICD-10-CM

## 2019-04-12 LAB — POCT GLYCOSYLATED HEMOGLOBIN (HGB A1C): HbA1c, POC (controlled diabetic range): 6.1 % (ref 0.0–7.0)

## 2019-04-12 MED ORDER — OMEPRAZOLE 40 MG PO CPDR: 40.0000 mg | DELAYED_RELEASE_CAPSULE | Freq: Every day | ORAL | 3 refills | Status: DC

## 2019-04-12 NOTE — Patient Instructions (Addendum)
Thank you for coming by today!   Below, you will find instructions for the things we discussed:   Diarrhea: Go ahead and stop taking azithromycin as this seems to be causing the diarrhea you have been experiencing. If you can tolerate it, try to eat Activia Yogurt to get some of the good bacteria back in your GI tract. Call me back in 1 month to follow-up on this.   GERD:  I have included a prescription refill for your Omeprazole. Sorry you weren't able to get it refilled last month. Please let us know if you have any issues getting the prescription refilled.

## 2019-04-12 NOTE — Progress Notes (Signed)
    Subjective:  Michael Robertson. is a 73 y.o. male who presents to the [facility] today with a chief complaint of diarrhea.   HPI:  DIARRHEA Patient has experienced chronic diarrhea since March 2020. Patient states that symptoms began after he was started on daily azithromycin. States that he visited his pulmonologist in June, who switched his azithromycin regimen to Monday, Wednesday, Friday. He has experienced diarrhea on the days he takes his medication and reports relief when he is not taking it on the weekends. Stool quality is brown and "runny". No fever, chills, nausea, abdominal pain, vomiting, blood in stool noted.   GERD Patient also presents with heartburn for 1 day. Symptoms are worse when he lies down. Past medical history is significant for GERD and patient was unable to fill his prescription last month. He reports relief with over-the-counter Nexium.   Chief Complaint noted Review of Symptoms - see HPI PMH - Smoking status noted.    Objective:  Physical Exam: BP 108/60   Pulse 84   Wt 175 lb 9.6 oz (79.7 kg)   SpO2 94%   BMI 24.49 kg/m    Gen: NAD, resting comfortably CV: RRR with no murmurs appreciated GI: Soft, Nontender, Nondistended Psych: Normal affect and thought content  Results for orders placed or performed in visit on 04/12/19 (from the past 72 hour(s))  HgB A1c     Status: None   Collection Time: 04/12/19 10:55 AM  Result Value Ref Range   Hemoglobin A1C     HbA1c POC (<> result, manual entry)     HbA1c, POC (prediabetic range)     HbA1c, POC (controlled diabetic range) 6.1 0.0 - 7.0 %     Assessment/Plan:  G E R D Patient has a history of GERD that has been well-controlled with omeprazole. Patient states that he has experienced mild heartburn for the past 24 hours and was unable to fill his omeprazole prescription last month. He has been without the medication for the past 3 days. Patient provided a prescription for omeprazole today and told to  contact clinic if he has any issues having it refilled.   Antibiotic-associated diarrhea Patient reports diarrhea since starting daily azithromycin in March. Patient has long-standing COPD and is taking medication to reduce COPD exacerbations. Azithromycin regimen was switched to every other day by pulmonologist in June, and patient notes relief from diarrhea on the days he does not take azithromycin. Patient advised to stop taking azithromycin at this time and to supplement diet with Activia yogurt to re-constitute GI biome. Told to follow-up in 1 month to monitor symptoms and discuss re-starting azithromycin before the winter when his COPD flare-ups typically occur.     Gwynneth Albright, Medical Student

## 2019-04-12 NOTE — Assessment & Plan Note (Signed)
Patient reports diarrhea since starting daily azithromycin in March. Patient has long-standing COPD and is taking medication to reduce COPD exacerbations. Azithromycin regimen was switched to every other day by pulmonologist in June, and patient notes relief from diarrhea on the days he does not take azithromycin. Patient advised to stop taking azithromycin at this time and to supplement diet with Activia yogurt to re-constitute GI biome. Told to follow-up in 1 month to monitor symptoms and discuss re-starting azithromycin before the winter when his COPD flare-ups typically occur.

## 2019-04-12 NOTE — Assessment & Plan Note (Signed)
Patient has a history of GERD that has been well-controlled with omeprazole. Patient states that he has experienced mild heartburn for the past 24 hours and was unable to fill his omeprazole prescription last month. He has been without the medication for the past 3 days. Patient provided a prescription for omeprazole today and told to contact clinic if he has any issues having it refilled.

## 2019-04-13 ENCOUNTER — Encounter: Payer: Self-pay | Admitting: Family Medicine

## 2019-05-20 ENCOUNTER — Other Ambulatory Visit: Payer: Self-pay | Admitting: Family Medicine

## 2019-05-20 DIAGNOSIS — I10 Essential (primary) hypertension: Secondary | ICD-10-CM

## 2019-05-21 ENCOUNTER — Telehealth (INDEPENDENT_AMBULATORY_CARE_PROVIDER_SITE_OTHER): Payer: Medicare Other | Admitting: Family Medicine

## 2019-05-21 ENCOUNTER — Other Ambulatory Visit: Payer: Self-pay

## 2019-05-21 DIAGNOSIS — J439 Emphysema, unspecified: Secondary | ICD-10-CM | POA: Diagnosis not present

## 2019-05-21 NOTE — Progress Notes (Signed)
Agrees to phone call.  No video is available to him Staying isolated knows to stay isolated.   FU from 9/24.  Diarrhea definitely improved off antibiotics.  One minor flair of COPD off antibiotics stopped.  Did not need prednisone.   We concluded best to stay off chronic azithro.  He has a large supply at home and knows that 5 day course with prednisone is OK for flairs.   He remains very careful and isolated with COVID.   Duration of visit was 15 minutes.

## 2019-05-22 NOTE — Assessment & Plan Note (Signed)
DC chronic azithro.  OK to use with flairs.

## 2019-06-06 ENCOUNTER — Other Ambulatory Visit: Payer: Self-pay | Admitting: Family Medicine

## 2019-07-30 ENCOUNTER — Other Ambulatory Visit: Payer: Self-pay | Admitting: Family Medicine

## 2019-07-30 DIAGNOSIS — J439 Emphysema, unspecified: Secondary | ICD-10-CM

## 2019-09-12 ENCOUNTER — Other Ambulatory Visit: Payer: Self-pay | Admitting: *Deleted

## 2019-09-12 MED ORDER — AZITHROMYCIN 250 MG PO TABS: ORAL_TABLET | ORAL | 3 refills | Status: DC

## 2019-09-12 NOTE — Telephone Encounter (Signed)
Called patient and verified schedule (3 days per week.)  90 day Rx sent.

## 2019-09-12 NOTE — Telephone Encounter (Signed)
Received another fax that says-Refill request for full quantity of 90 for the Azithromycin. Traivon Morrical Zimmerman [place], CMA

## 2019-09-12 NOTE — Telephone Encounter (Signed)
Received fax requesting a refill to be authorized for Azithromycin, did not see this on current med list. Reyah Streeter Lamonte Sakai, CMA

## 2019-09-22 ENCOUNTER — Other Ambulatory Visit: Payer: Self-pay | Admitting: Family Medicine

## 2019-09-27 ENCOUNTER — Other Ambulatory Visit: Payer: Self-pay | Admitting: Family Medicine

## 2019-10-24 ENCOUNTER — Other Ambulatory Visit: Payer: Self-pay | Admitting: Family Medicine

## 2019-10-24 DIAGNOSIS — J439 Emphysema, unspecified: Secondary | ICD-10-CM

## 2019-11-12 ENCOUNTER — Ambulatory Visit (INDEPENDENT_AMBULATORY_CARE_PROVIDER_SITE_OTHER): Payer: Medicare Other | Admitting: Family Medicine

## 2019-11-12 ENCOUNTER — Encounter: Payer: Self-pay | Admitting: Family Medicine

## 2019-11-12 ENCOUNTER — Other Ambulatory Visit: Payer: Self-pay

## 2019-11-12 DIAGNOSIS — I10 Essential (primary) hypertension: Secondary | ICD-10-CM | POA: Diagnosis not present

## 2019-11-12 DIAGNOSIS — R7303 Prediabetes: Secondary | ICD-10-CM | POA: Diagnosis not present

## 2019-11-12 DIAGNOSIS — N1831 Chronic kidney disease, stage 3a: Secondary | ICD-10-CM

## 2019-11-12 DIAGNOSIS — J9611 Chronic respiratory failure with hypoxia: Secondary | ICD-10-CM

## 2019-11-12 DIAGNOSIS — E7800 Pure hypercholesterolemia, unspecified: Secondary | ICD-10-CM

## 2019-11-12 DIAGNOSIS — I480 Paroxysmal atrial fibrillation: Secondary | ICD-10-CM | POA: Diagnosis not present

## 2019-11-12 DIAGNOSIS — I4891 Unspecified atrial fibrillation: Secondary | ICD-10-CM | POA: Insufficient documentation

## 2019-11-12 DIAGNOSIS — E119 Type 2 diabetes mellitus without complications: Secondary | ICD-10-CM | POA: Diagnosis not present

## 2019-11-12 LAB — POCT GLYCOSYLATED HEMOGLOBIN (HGB A1C): HbA1c, POC (controlled diabetic range): 5.8 % (ref 0.0–7.0)

## 2019-11-12 NOTE — Assessment & Plan Note (Signed)
Stable on current meds 

## 2019-11-12 NOTE — Assessment & Plan Note (Signed)
Well controled.  Check lipids.

## 2019-11-12 NOTE — Patient Instructions (Signed)
I will call with the blood test results.   I am delighted that you have gotten both covid vaccines. Everything seems pretty steady to me. The one thing: please work on doing exercise. Please give your [facility] permission to seem the blood work.

## 2019-11-12 NOTE — Progress Notes (Signed)
    SUBJECTIVE:   CHIEF COMPLAINT / HPI:   FU chronic medical problems. 1. COPD.  Currently on day 3 of pred and defdinir for a flair [facility].  He has had both covid vaccines.  Was diligent about social isolation during covid.  The only down side is that he has exercised less over the past year. 2. A fib.  Follow by cards at [facility].  PAF.  He does not feel palpitations.  Occasionally an erratic pulse reading on his home pulse ox will alert him to a fib.  On apixiban.  No bleeding noted. 3. Diabetes.  I suspect this is not the correct dx - more prediabetic.  Current A1C=5.8 off all diabetic meds.   4. HPDP: Given the severity of his COPD, we jointly decided that he is not a candidate for colonoscopy for colon cancer screen.  Changed on his HM form.   OBJECTIVE:   BP 130/68   Pulse 72   Ht 5\' 11"  (1.803 m)   Wt 175 lb 12.8 oz (79.7 kg)   SpO2 91% Comment: @2L   BMI 24.52 kg/m   Lungs, no wheeze Barrel chested with decrease vital capcity.  No dyspnea on his usual home O2 dose. Cardiac RRR without m or g Ext no edema.  ASSESSMENT/PLAN:   Chronic respiratory failure with hypoxia (HCC) Stable on current meds  CKD (chronic kidney disease) stage 3, GFR 30-59 ml/min Recheck creat.    HYPERTENSION, BENIGN SYSTEMIC Well controled.  Check lipids.  Prediabetes Change to prediabetes as a problem  PAF (paroxysmal atrial fibrillation) (HCC) Asymptomatic on apixiban.     , MD [facility]

## 2019-11-12 NOTE — Assessment & Plan Note (Signed)
Recheck creat. 

## 2019-11-12 NOTE — Assessment & Plan Note (Signed)
Change to prediabetes as a problem

## 2019-11-12 NOTE — Assessment & Plan Note (Signed)
Asymptomatic on apixiban.

## 2019-11-13 LAB — CMP14+EGFR
ALT: 30 IU/L (ref 0–44)
AST: 29 IU/L (ref 0–40)
Albumin/Globulin Ratio: 2 (ref 1.2–2.2)
Albumin: 4.6 g/dL (ref 3.7–4.7)
Alkaline Phosphatase: 63 IU/L (ref 39–117)
BUN/Creatinine Ratio: 16 (ref 10–24)
BUN: 22 mg/dL (ref 8–27)
Bilirubin Total: 0.3 mg/dL (ref 0.0–1.2)
CO2: 23 mmol/L (ref 20–29)
Calcium: 9.7 mg/dL (ref 8.6–10.2)
Chloride: 101 mmol/L (ref 96–106)
Creatinine, Ser: 1.36 mg/dL — ABNORMAL HIGH (ref 0.76–1.27)
GFR calc Af Amer: 58 mL/min/{1.73_m2} — ABNORMAL LOW (ref >59)
GFR calc non Af Amer: 51 mL/min/{1.73_m2} — ABNORMAL LOW (ref >59)
Globulin, Total: 2.3 g/dL (ref 1.5–4.5)
Glucose: 95 mg/dL (ref 65–99)
Potassium: 4 mmol/L (ref 3.5–5.2)
Sodium: 142 mmol/L (ref 134–144)
Total Protein: 6.9 g/dL (ref 6.0–8.5)

## 2019-11-13 LAB — CBC
Hematocrit: 40.3 % (ref 37.5–51.0)
Hemoglobin: 13.3 g/dL (ref 13.0–17.7)
MCH: 31.2 pg (ref 26.6–33.0)
MCHC: 33 g/dL (ref 31.5–35.7)
MCV: 95 fL (ref 79–97)
Platelets: 318 10*3/uL (ref 150–450)
RBC: 4.26 x10E6/uL (ref 4.14–5.80)
RDW: 11.7 % (ref 11.6–15.4)
WBC: 10.1 10*3/uL (ref 3.4–10.8)

## 2019-11-13 LAB — LIPID PANEL
Chol/HDL Ratio: 3.9 ratio (ref 0.0–5.0)
Cholesterol, Total: 185 mg/dL (ref 100–199)
HDL: 47 mg/dL (ref >39)
LDL Chol Calc (NIH): 111 mg/dL — ABNORMAL HIGH (ref 0–99)
Triglycerides: 152 mg/dL — ABNORMAL HIGH (ref 0–149)
VLDL Cholesterol Cal: 27 mg/dL (ref 5–40)

## 2019-11-13 MED ORDER — ROSUVASTATIN CALCIUM 20 MG PO TABS: 20.0000 mg | ORAL_TABLET | Freq: Every day | ORAL | 3 refills | Status: DC

## 2019-11-13 NOTE — Addendum Note (Signed)
Addended by: Moses Manners on: 11/13/2019 03:28 PM   Modules accepted: Orders

## 2019-11-13 NOTE — Assessment & Plan Note (Signed)
Called with lab results.  We decided to switch from pravastatin to rosuvastatin to try to get better LDL lowering.

## 2019-11-26 ENCOUNTER — Other Ambulatory Visit: Payer: Self-pay | Admitting: Family Medicine

## 2019-11-26 DIAGNOSIS — N400 Enlarged prostate without lower urinary tract symptoms: Secondary | ICD-10-CM

## 2019-12-20 ENCOUNTER — Other Ambulatory Visit: Payer: Self-pay | Admitting: Family Medicine

## 2019-12-20 DIAGNOSIS — N529 Male erectile dysfunction, unspecified: Secondary | ICD-10-CM

## 2020-01-10 ENCOUNTER — Other Ambulatory Visit: Payer: Self-pay | Admitting: Family Medicine

## 2020-01-15 ENCOUNTER — Encounter: Payer: Self-pay | Admitting: Family Medicine

## 2020-01-24 ENCOUNTER — Other Ambulatory Visit: Payer: Self-pay

## 2020-01-24 ENCOUNTER — Encounter: Payer: Self-pay | Admitting: Family Medicine

## 2020-01-24 ENCOUNTER — Ambulatory Visit (INDEPENDENT_AMBULATORY_CARE_PROVIDER_SITE_OTHER): Payer: Medicare Other | Admitting: Family Medicine

## 2020-01-24 DIAGNOSIS — I10 Essential (primary) hypertension: Secondary | ICD-10-CM | POA: Diagnosis not present

## 2020-01-24 DIAGNOSIS — N1831 Chronic kidney disease, stage 3a: Secondary | ICD-10-CM | POA: Diagnosis not present

## 2020-01-24 DIAGNOSIS — I480 Paroxysmal atrial fibrillation: Secondary | ICD-10-CM

## 2020-01-24 DIAGNOSIS — J439 Emphysema, unspecified: Secondary | ICD-10-CM

## 2020-01-24 NOTE — Progress Notes (Signed)
    SUBJECTIVE:   CHIEF COMPLAINT / HPI:   Leg swelling, check kidneys. Mr. [place] has been doing quite well.  His breathing has been generally good.  Issues: 1. Had a long day car round trip, ate at fast food, and had leg swelling.  The leg swelling has subsequently resolved.  He only worries about his kidneys because his ARB dose was increased and renal function was not rechecked 2. COPD stable on home O2.  Severe.  Now on 5 mg pred daily and 3x/week azithro per pulm.   3. BP, feels fine on higher dose of ARB except for single episode of leg swelling. 4. PAF.  No palpitations.   OBJECTIVE:   BP (!) 146/74   Pulse 70   Ht 5\' 11"  (1.803 m)   Wt 174 lb 6.4 oz (79.1 kg)   SpO2 96%   BMI 24.32 kg/m   VS and weight noted. Lungs normal for him which is barrel chested with prolonged exp phase. Cardiac RRR without m or g Ext no edema.  ASSESSMENT/PLAN:   No problem-specific Assessment & Plan notes found for this encounter.     , MD [facility]

## 2020-01-24 NOTE — Assessment & Plan Note (Signed)
Stable on current meds.  Needs BMP since recent increase in ARB dose.

## 2020-01-24 NOTE — Patient Instructions (Signed)
I will call you with the lab test results.

## 2020-01-24 NOTE — Assessment & Plan Note (Signed)
Stable on current meds 

## 2020-01-24 NOTE — Assessment & Plan Note (Signed)
Now on daily pred.  He is at his symptom baseline.

## 2020-01-24 NOTE — Assessment & Plan Note (Signed)
Needs BMP with recent increase in ARB dose.

## 2020-01-25 LAB — BASIC METABOLIC PANEL
BUN/Creatinine Ratio: 14 (ref 10–24)
BUN: 18 mg/dL (ref 8–27)
CO2: 27 mmol/L (ref 20–29)
Calcium: 9.5 mg/dL (ref 8.6–10.2)
Chloride: 102 mmol/L (ref 96–106)
Creatinine, Ser: 1.25 mg/dL (ref 0.76–1.27)
GFR calc Af Amer: 65 mL/min/{1.73_m2} (ref >59)
GFR calc non Af Amer: 56 mL/min/{1.73_m2} — ABNORMAL LOW (ref >59)
Glucose: 100 mg/dL — ABNORMAL HIGH (ref 65–99)
Potassium: 4 mmol/L (ref 3.5–5.2)
Sodium: 144 mmol/L (ref 134–144)

## 2020-01-25 NOTE — Telephone Encounter (Signed)
Called and LM that renal function is normal.

## 2020-04-09 ENCOUNTER — Other Ambulatory Visit: Payer: Self-pay | Admitting: Family Medicine

## 2020-04-09 DIAGNOSIS — K219 Gastro-esophageal reflux disease without esophagitis: Secondary | ICD-10-CM

## 2020-04-20 ENCOUNTER — Other Ambulatory Visit: Payer: Self-pay | Admitting: Family Medicine

## 2020-05-08 ENCOUNTER — Other Ambulatory Visit: Payer: Self-pay

## 2020-05-08 ENCOUNTER — Encounter: Payer: Self-pay | Admitting: Family Medicine

## 2020-05-08 ENCOUNTER — Ambulatory Visit (INDEPENDENT_AMBULATORY_CARE_PROVIDER_SITE_OTHER): Payer: Medicare Other | Admitting: Family Medicine

## 2020-05-08 DIAGNOSIS — R7303 Prediabetes: Secondary | ICD-10-CM

## 2020-05-08 DIAGNOSIS — R5383 Other fatigue: Secondary | ICD-10-CM | POA: Diagnosis not present

## 2020-05-08 DIAGNOSIS — I10 Essential (primary) hypertension: Secondary | ICD-10-CM

## 2020-05-08 DIAGNOSIS — I480 Paroxysmal atrial fibrillation: Secondary | ICD-10-CM | POA: Diagnosis not present

## 2020-05-08 DIAGNOSIS — J439 Emphysema, unspecified: Secondary | ICD-10-CM

## 2020-05-08 LAB — POCT GLYCOSYLATED HEMOGLOBIN (HGB A1C): Hemoglobin A1C: 6.7 % — AB (ref 4.0–5.6)

## 2020-05-08 NOTE — Assessment & Plan Note (Signed)
Check CBC and TSH.  Likely just progression of age and chronic disease.

## 2020-05-08 NOTE — Assessment & Plan Note (Signed)
Given his noted intermitant bradycardia, I will not increase bisoprolol.  A fib spells are short lived and he is on anticoag.  No bad RVR.  No change.

## 2020-05-08 NOTE — Progress Notes (Signed)
    SUBJECTIVE:   CHIEF COMPLAINT / HPI:   FU COPD and HBP and a fib COPD.  Doing well.  Now and daily pred and antibiotics per pulm.  Asks what to do for flairs.  Wants flu shot. HBP.  BP today a bit low.  Has no lightheadedness.  No CP or SOB.   A fib parox.  States HR will flutuate from 40s to 120.  Only med is bisoprolol to control vent rate.  On anticoag. Fatigue.  Feels tired.  No other specific sx.  Just lacks energy to complete tasks.     OBJECTIVE:   BP (!) 124/58   Pulse 75   Ht 5\' 11"  (1.803 m)   Wt 178 lb 3.2 oz (80.8 kg)   SpO2 93% Comment: @2L   BMI 24.85 kg/m   Lung good air movement.  Does have end exp wheeze bilaterally Cardiac RRR without m or g Ext no edema.  ASSESSMENT/PLAN:   Prediabetes A1C is up to 6.7 in diabetic range but also on daily pred.  I am unclear whether to label as prediabetic or diabetic.  PAF (paroxysmal atrial fibrillation) (HCC) Given his noted intermitant bradycardia, I will not increase bisoprolol.  A fib spells are short lived and he is on anticoag.  No bad RVR.  No change.  HYPERTENSION, BENIGN SYSTEMIC I worry that I am overtreating.  He will check home BP and let me know.  COPD (chronic obstructive pulmonary disease) with emphysema (HCC) Stable on current meds.  No change.  Flu shot today.  Fatigue Check CBC and TSH.  Likely just progression of age and chronic disease.     , MD [facility]

## 2020-05-08 NOTE — Assessment & Plan Note (Signed)
Stable on current meds.  No change.  Flu shot today.

## 2020-05-08 NOTE — Patient Instructions (Addendum)
I will call with the results of the blood work. Please take the bisoprolol and losartan at night.   Keep on the HCTZ during the day. I may need to decrease your blood pressure doses.  Please check your blood pressure at home 3-4 times and let me know.  I especially worry if the bottom number is 60 or blood.   OK to take 40 mg of prednisone for 5 days if you have a flair. 8 five mg pills daily. Let me know if the depression becomes more of a problem.

## 2020-05-08 NOTE — Assessment & Plan Note (Signed)
A1C is up to 6.7 in diabetic range but also on daily pred.  I am unclear whether to label as prediabetic or diabetic.

## 2020-05-08 NOTE — Assessment & Plan Note (Signed)
I worry that I am overtreating.  He will check home BP and let me know.

## 2020-05-09 LAB — CBC
Hematocrit: 42.6 % (ref 37.5–51.0)
Hemoglobin: 14.1 g/dL (ref 13.0–17.7)
MCH: 32.3 pg (ref 26.6–33.0)
MCHC: 33.1 g/dL (ref 31.5–35.7)
MCV: 98 fL — ABNORMAL HIGH (ref 79–97)
Platelets: 301 10*3/uL (ref 150–450)
RBC: 4.36 x10E6/uL (ref 4.14–5.80)
RDW: 11.5 % — ABNORMAL LOW (ref 11.6–15.4)
WBC: 10.4 10*3/uL (ref 3.4–10.8)

## 2020-05-09 LAB — BASIC METABOLIC PANEL
BUN/Creatinine Ratio: 14 (ref 10–24)
BUN: 20 mg/dL (ref 8–27)
CO2: 24 mmol/L (ref 20–29)
Calcium: 9.4 mg/dL (ref 8.6–10.2)
Chloride: 100 mmol/L (ref 96–106)
Creatinine, Ser: 1.42 mg/dL — ABNORMAL HIGH (ref 0.76–1.27)
GFR calc Af Amer: 55 mL/min/{1.73_m2} — ABNORMAL LOW (ref >59)
GFR calc non Af Amer: 48 mL/min/{1.73_m2} — ABNORMAL LOW (ref >59)
Glucose: 105 mg/dL — ABNORMAL HIGH (ref 65–99)
Potassium: 3.8 mmol/L (ref 3.5–5.2)
Sodium: 141 mmol/L (ref 134–144)

## 2020-05-09 LAB — TSH: TSH: 1.73 u[IU]/mL (ref 0.450–4.500)

## 2020-05-13 ENCOUNTER — Encounter: Payer: Self-pay | Admitting: Family Medicine

## 2020-06-05 ENCOUNTER — Other Ambulatory Visit: Payer: Self-pay | Admitting: Family Medicine

## 2020-06-05 DIAGNOSIS — J439 Emphysema, unspecified: Secondary | ICD-10-CM

## 2020-06-05 DIAGNOSIS — K219 Gastro-esophageal reflux disease without esophagitis: Secondary | ICD-10-CM

## 2020-08-22 ENCOUNTER — Other Ambulatory Visit: Payer: Self-pay | Admitting: Family Medicine

## 2020-08-22 ENCOUNTER — Other Ambulatory Visit: Payer: Self-pay | Admitting: *Deleted

## 2020-08-22 DIAGNOSIS — I10 Essential (primary) hypertension: Secondary | ICD-10-CM

## 2020-08-22 DIAGNOSIS — K219 Gastro-esophageal reflux disease without esophagitis: Secondary | ICD-10-CM

## 2020-08-22 NOTE — Telephone Encounter (Signed)
Requesting a 90 day supply with 3 refills. Londen Lorge Zimmerman Rumple, CMA

## 2020-08-25 MED ORDER — OMEPRAZOLE 40 MG PO CPDR: 40.0000 mg | DELAYED_RELEASE_CAPSULE | Freq: Every day | ORAL | 3 refills | Status: DC

## 2020-09-18 ENCOUNTER — Other Ambulatory Visit: Payer: Self-pay | Admitting: Family Medicine

## 2020-10-02 ENCOUNTER — Other Ambulatory Visit: Payer: Self-pay | Admitting: Family Medicine

## 2020-10-02 DIAGNOSIS — E7800 Pure hypercholesterolemia, unspecified: Secondary | ICD-10-CM

## 2020-10-29 ENCOUNTER — Ambulatory Visit: Payer: TRICARE For Life (TFL) | Admitting: Family Medicine

## 2020-11-03 ENCOUNTER — Encounter: Payer: Self-pay | Admitting: Family Medicine

## 2020-11-03 ENCOUNTER — Other Ambulatory Visit: Payer: Self-pay

## 2020-11-03 ENCOUNTER — Ambulatory Visit (INDEPENDENT_AMBULATORY_CARE_PROVIDER_SITE_OTHER): Payer: Medicare Other | Admitting: Family Medicine

## 2020-11-03 VITALS — BP 128/78 | HR 71 | Ht 71.0 in | Wt 177.2 lb

## 2020-11-03 DIAGNOSIS — Z515 Encounter for palliative care: Secondary | ICD-10-CM

## 2020-11-03 DIAGNOSIS — J9611 Chronic respiratory failure with hypoxia: Secondary | ICD-10-CM | POA: Diagnosis not present

## 2020-11-03 DIAGNOSIS — E7800 Pure hypercholesterolemia, unspecified: Secondary | ICD-10-CM

## 2020-11-03 DIAGNOSIS — E1122 Type 2 diabetes mellitus with diabetic chronic kidney disease: Secondary | ICD-10-CM | POA: Diagnosis not present

## 2020-11-03 DIAGNOSIS — E119 Type 2 diabetes mellitus without complications: Secondary | ICD-10-CM

## 2020-11-03 DIAGNOSIS — J439 Emphysema, unspecified: Secondary | ICD-10-CM | POA: Diagnosis not present

## 2020-11-03 DIAGNOSIS — N1831 Chronic kidney disease, stage 3a: Secondary | ICD-10-CM | POA: Diagnosis not present

## 2020-11-03 DIAGNOSIS — I10 Essential (primary) hypertension: Secondary | ICD-10-CM | POA: Diagnosis not present

## 2020-11-03 LAB — POCT GLYCOSYLATED HEMOGLOBIN (HGB A1C): HbA1c, POC (controlled diabetic range): 6.6 % (ref 0.0–7.0)

## 2020-11-03 MED ORDER — VIRTUSSIN A/C 100-10 MG/5ML PO SOLN: ORAL | 5 refills | Status: DC

## 2020-11-03 NOTE — Patient Instructions (Addendum)
Call the nurse and tell her what eye drops you are taking so that I can put it on your list. Please do get the 4th Moderna shot.  It is recommended now.     I will call with lab test results.   Keep up the great work.   When I call with test results, I may want to start you on a new medication for diabetes, not because the diabetes is out control, but because it helps protect your kidneys and you do show some minor kidney damage.

## 2020-11-04 ENCOUNTER — Encounter: Payer: Self-pay | Admitting: Family Medicine

## 2020-11-04 LAB — CMP14+EGFR
ALT: 27 IU/L (ref 0–44)
AST: 21 IU/L (ref 0–40)
Albumin/Globulin Ratio: 1.9 (ref 1.2–2.2)
Albumin: 4.3 g/dL (ref 3.7–4.7)
Alkaline Phosphatase: 61 IU/L (ref 44–121)
BUN/Creatinine Ratio: 14 (ref 10–24)
BUN: 17 mg/dL (ref 8–27)
Bilirubin Total: 0.3 mg/dL (ref 0.0–1.2)
CO2: 26 mmol/L (ref 20–29)
Calcium: 9.3 mg/dL (ref 8.6–10.2)
Chloride: 100 mmol/L (ref 96–106)
Creatinine, Ser: 1.22 mg/dL (ref 0.76–1.27)
Globulin, Total: 2.3 g/dL (ref 1.5–4.5)
Glucose: 108 mg/dL — ABNORMAL HIGH (ref 65–99)
Potassium: 4.1 mmol/L (ref 3.5–5.2)
Sodium: 143 mmol/L (ref 134–144)
Total Protein: 6.6 g/dL (ref 6.0–8.5)
eGFR: 61 mL/min/{1.73_m2} (ref >59)

## 2020-11-04 LAB — CBC
Hematocrit: 40 % (ref 37.5–51.0)
Hemoglobin: 13 g/dL (ref 13.0–17.7)
MCH: 31.3 pg (ref 26.6–33.0)
MCHC: 32.5 g/dL (ref 31.5–35.7)
MCV: 96 fL (ref 79–97)
Platelets: 301 10*3/uL (ref 150–450)
RBC: 4.15 x10E6/uL (ref 4.14–5.80)
RDW: 11.7 % (ref 11.6–15.4)
WBC: 12.6 10*3/uL — ABNORMAL HIGH (ref 3.4–10.8)

## 2020-11-04 LAB — LDL CHOLESTEROL, DIRECT: LDL Direct: 77 mg/dL (ref 0–99)

## 2020-11-04 MED ORDER — EMPAGLIFLOZIN 10 MG PO TABS: 10.0000 mg | ORAL_TABLET | Freq: Every day | ORAL | 3 refills | Status: DC

## 2020-11-04 NOTE — Assessment & Plan Note (Signed)
Reaffirmed that he has plans and has discussed with his wife who is also his HCPOA

## 2020-11-04 NOTE — Assessment & Plan Note (Signed)
No change in current med management.

## 2020-11-04 NOTE — Progress Notes (Signed)
    SUBJECTIVE:   CHIEF COMPLAINT / HPI:   FU multiple problems 1. DM, well controled by diet.  A1C at goal.  See CKD below. 2. CKD 3a with DM.  On ARB.  Not on SGLT2. 3. COPD severe on home O2 and daily predinisone.  Knows it is getting worse.  If he over exerts himself, sats with drop into the 70s.  He knows to sit and recover.  Follow[facility]m at [facility].  Has flairs every 2-3 months which require higher dose pred.  Feels fine today. 4. Hypercholesterolemia with dM.  Goal LDL <70.  Last check was not at goal 5. Goals of care.  Has living will and HCPOA.  Would be OK with short term vent.  Does not want long term vent.  He is enjoying life and hopes to keep going.  He is realistic and knows that his COPD will progress and kill him   OBJECTIVE:   BP 128/78   Pulse 71   Ht 5\' 11"  (1.803 m)   Wt 177 lb 3.2 oz (80.4 kg)   SpO2 98% Comment: @3L  due to rain  BMI 24.71 kg/m   Lungs, prolonged exp phase and end exp wheeze. Cardiac RRR without m or g Ext no edema.  ASSESSMENT/PLAN:   CKD (chronic kidney disease) stage 3, GFR 30-59 ml/min Surprisingly improved.  Called and will start SGLT2 to preserve kidney function.    Chronic respiratory failure with hypoxia (HCC) No change in current med management.  Hypercholesteremia Recheck and called with result that he is at goal.  Palliative care encounter Reaffirmed that he has plans and has discussed with his wife who is also his HCPOA     , MD [facility]

## 2020-11-04 NOTE — Assessment & Plan Note (Signed)
Surprisingly improved.  Called and will start SGLT2 to preserve kidney function.

## 2020-11-04 NOTE — Assessment & Plan Note (Signed)
Recheck and called with result that he is at goal.

## 2020-11-10 ENCOUNTER — Telehealth: Payer: Self-pay

## 2020-11-10 DIAGNOSIS — J439 Emphysema, unspecified: Secondary | ICD-10-CM

## 2020-11-10 MED ORDER — GUAIFENESIN-CODEINE 100-10 MG/5ML PO SOLN: 5.0000 mL | Freq: Two times a day (BID) | ORAL | 0 refills | Status: DC | PRN

## 2020-11-10 NOTE — Telephone Encounter (Signed)
Received fax from [facility]. Virtussin AC Liquid is no longer manufactured. A possible alternative is Guaifen & Codeine Solution.   Sunday Spillers, CMA

## 2020-11-10 NOTE — Telephone Encounter (Signed)
Changed Virtussin AC Rx to guaifenesin + codine Disp 120 mg  RF zero

## 2020-11-10 NOTE — Addendum Note (Signed)
Addended byPerley Jain, Shamikia Linskey D on: 11/10/2020 11:01 AM   Modules accepted: Orders

## 2020-11-20 ENCOUNTER — Other Ambulatory Visit: Payer: Self-pay | Admitting: Family Medicine

## 2020-11-20 DIAGNOSIS — N400 Enlarged prostate without lower urinary tract symptoms: Secondary | ICD-10-CM

## 2020-12-05 DIAGNOSIS — H401131 Primary open-angle glaucoma, bilateral, mild stage: Secondary | ICD-10-CM | POA: Diagnosis not present

## 2020-12-05 DIAGNOSIS — H25811 Combined forms of age-related cataract, right eye: Secondary | ICD-10-CM | POA: Diagnosis not present

## 2020-12-05 DIAGNOSIS — H25812 Combined forms of age-related cataract, left eye: Secondary | ICD-10-CM | POA: Diagnosis not present

## 2020-12-30 DIAGNOSIS — Z01818 Encounter for other preprocedural examination: Secondary | ICD-10-CM | POA: Diagnosis not present

## 2020-12-30 DIAGNOSIS — H25812 Combined forms of age-related cataract, left eye: Secondary | ICD-10-CM | POA: Diagnosis not present

## 2020-12-30 DIAGNOSIS — E119 Type 2 diabetes mellitus without complications: Secondary | ICD-10-CM | POA: Diagnosis not present

## 2021-01-02 DIAGNOSIS — H401111 Primary open-angle glaucoma, right eye, mild stage: Secondary | ICD-10-CM | POA: Diagnosis not present

## 2021-01-02 DIAGNOSIS — H25812 Combined forms of age-related cataract, left eye: Secondary | ICD-10-CM | POA: Diagnosis not present

## 2021-01-02 DIAGNOSIS — H409 Unspecified glaucoma: Secondary | ICD-10-CM | POA: Diagnosis not present

## 2021-01-02 DIAGNOSIS — H401131 Primary open-angle glaucoma, bilateral, mild stage: Secondary | ICD-10-CM | POA: Diagnosis not present

## 2021-01-15 DIAGNOSIS — H409 Unspecified glaucoma: Secondary | ICD-10-CM | POA: Diagnosis not present

## 2021-01-15 DIAGNOSIS — H25811 Combined forms of age-related cataract, right eye: Secondary | ICD-10-CM | POA: Diagnosis not present

## 2021-01-15 DIAGNOSIS — H401131 Primary open-angle glaucoma, bilateral, mild stage: Secondary | ICD-10-CM | POA: Diagnosis not present

## 2021-01-15 DIAGNOSIS — H401111 Primary open-angle glaucoma, right eye, mild stage: Secondary | ICD-10-CM | POA: Diagnosis not present

## 2021-02-08 DIAGNOSIS — U071 COVID-19: Secondary | ICD-10-CM | POA: Diagnosis not present

## 2021-02-08 DIAGNOSIS — K219 Gastro-esophageal reflux disease without esophagitis: Secondary | ICD-10-CM | POA: Diagnosis not present

## 2021-02-08 DIAGNOSIS — E119 Type 2 diabetes mellitus without complications: Secondary | ICD-10-CM | POA: Diagnosis not present

## 2021-02-08 DIAGNOSIS — R918 Other nonspecific abnormal finding of lung field: Secondary | ICD-10-CM | POA: Diagnosis not present

## 2021-02-08 DIAGNOSIS — R0981 Nasal congestion: Secondary | ICD-10-CM | POA: Diagnosis not present

## 2021-02-08 DIAGNOSIS — I10 Essential (primary) hypertension: Secondary | ICD-10-CM | POA: Diagnosis not present

## 2021-02-08 DIAGNOSIS — R059 Cough, unspecified: Secondary | ICD-10-CM | POA: Diagnosis not present

## 2021-02-08 DIAGNOSIS — J449 Chronic obstructive pulmonary disease, unspecified: Secondary | ICD-10-CM | POA: Diagnosis not present

## 2021-02-11 ENCOUNTER — Telehealth: Payer: Self-pay

## 2021-02-11 NOTE — Telephone Encounter (Signed)
Patient LVM on nurse line to report testing positive for COVID on 7/24. Patient was started on Paxlovid. Patient has been taking for three days and has had improvement of his symptoms.   [place]. Patient will be returning to [place] area on Friday.  Patient just wanted to make provider aware.   Veronda Prude, RN

## 2021-02-12 NOTE — Telephone Encounter (Signed)
Noted.  I am glad that he is doing OK especially given his severe COPD

## 2021-02-14 DIAGNOSIS — Z20822 Contact with and (suspected) exposure to covid-19: Secondary | ICD-10-CM | POA: Diagnosis not present

## 2021-02-16 DIAGNOSIS — Z20822 Contact with and (suspected) exposure to covid-19: Secondary | ICD-10-CM | POA: Diagnosis not present

## 2021-02-18 ENCOUNTER — Other Ambulatory Visit: Payer: Self-pay | Admitting: Family Medicine

## 2021-02-18 DIAGNOSIS — J439 Emphysema, unspecified: Secondary | ICD-10-CM

## 2021-02-24 ENCOUNTER — Other Ambulatory Visit: Payer: Self-pay | Admitting: Family Medicine

## 2021-03-19 DIAGNOSIS — Z20822 Contact with and (suspected) exposure to covid-19: Secondary | ICD-10-CM | POA: Diagnosis not present

## 2021-04-18 DIAGNOSIS — Z20822 Contact with and (suspected) exposure to covid-19: Secondary | ICD-10-CM | POA: Diagnosis not present

## 2021-05-11 ENCOUNTER — Encounter: Payer: Self-pay | Admitting: Family Medicine

## 2021-05-11 ENCOUNTER — Other Ambulatory Visit: Payer: Self-pay

## 2021-05-11 ENCOUNTER — Ambulatory Visit (INDEPENDENT_AMBULATORY_CARE_PROVIDER_SITE_OTHER): Payer: Medicare Other | Admitting: Family Medicine

## 2021-05-11 VITALS — BP 156/84 | HR 86 | Wt 167.8 lb

## 2021-05-11 DIAGNOSIS — E7800 Pure hypercholesterolemia, unspecified: Secondary | ICD-10-CM

## 2021-05-11 DIAGNOSIS — E1122 Type 2 diabetes mellitus with diabetic chronic kidney disease: Secondary | ICD-10-CM | POA: Diagnosis not present

## 2021-05-11 DIAGNOSIS — N1831 Chronic kidney disease, stage 3a: Secondary | ICD-10-CM | POA: Diagnosis not present

## 2021-05-11 DIAGNOSIS — J439 Emphysema, unspecified: Secondary | ICD-10-CM | POA: Diagnosis not present

## 2021-05-11 DIAGNOSIS — Z23 Encounter for immunization: Secondary | ICD-10-CM | POA: Diagnosis not present

## 2021-05-11 DIAGNOSIS — I10 Essential (primary) hypertension: Secondary | ICD-10-CM

## 2021-05-11 NOTE — Assessment & Plan Note (Signed)
Check lipids today 

## 2021-05-11 NOTE — Assessment & Plan Note (Signed)
Stable now.   1. Flu shot today 2. COVID booster in one week. 3. He has gotten [facility] approval to reestablish with [place] pulm.  ENcouraged that appointment.

## 2021-05-11 NOTE — Assessment & Plan Note (Signed)
Check A1C.  On jardiance.

## 2021-05-11 NOTE — Assessment & Plan Note (Addendum)
REcheck BMP today.  On SGLT2 inhibitor to prevent progression.

## 2021-05-11 NOTE — Patient Instructions (Signed)
Flu shot today. I will call next with lab tests. Come in next week for a nurse visit for your COVID booster. I am glad you were able to give your wife her dream beach house. I hope the next six months is easier than the past six months.

## 2021-05-11 NOTE — Progress Notes (Signed)
    SUBJECTIVE:   CHIEF COMPLAINT / HPI:   FU COPD. "It has been a tough 6 months.  Pulmonologist, Advice worker, retired.  He had COVID in July.  He had a second, non COVID illness, in Sept.  Wife has liver cancer.  He helped her fulfill her dream of [place] beach - and was exhausted by the work of purchasing and moving into that house.    Seems OK now.  Still on O2 and daily prednisone.  Breathing is at his baseline.    Due for both Flu shot and COVID booster.  He would like to separate them, getting flu shot today.  Also due for labs  PERTINENT  PMH / PSH: Denies ankle swelling.  No CP.  DOE is at his baseline.  OBJECTIVE:   BP (!) 156/84   Pulse 86   Wt 167 lb 12.8 oz (76.1 kg)   SpO2 95%   BMI 23.40 kg/m   Lungs, poor air movement and prolonged exp phase. Cardiac RRR without m or g Ext no edema.  ASSESSMENT/PLAN:   COPD (chronic obstructive pulmonary disease) with emphysema (HCC) Stable now.   1. Flu shot today 2. COVID booster in one week. 3. He has gotten [facility] approval to reestablish with [place] pulm.  ENcouraged that appointment.  CKD (chronic kidney disease) stage 3, GFR 30-59 ml/min (HCC) REcheck BMP today.  On SGLT2 inhibitor to prevent progression.  DM (diabetes mellitus), type 2 with renal complications (HCC) Check A1C.  On jardiance.    Hypercholesteremia Check lipids today.     Moses Manners, MD [facility]

## 2021-05-12 ENCOUNTER — Other Ambulatory Visit: Payer: Self-pay

## 2021-05-12 ENCOUNTER — Other Ambulatory Visit (INDEPENDENT_AMBULATORY_CARE_PROVIDER_SITE_OTHER): Payer: Medicare Other

## 2021-05-12 DIAGNOSIS — N1831 Chronic kidney disease, stage 3a: Secondary | ICD-10-CM

## 2021-05-12 DIAGNOSIS — E1122 Type 2 diabetes mellitus with diabetic chronic kidney disease: Secondary | ICD-10-CM

## 2021-05-12 LAB — LIPID PANEL
Chol/HDL Ratio: 2.7 ratio (ref 0.0–5.0)
Cholesterol, Total: 150 mg/dL (ref 100–199)
HDL: 55 mg/dL (ref >39)
LDL Chol Calc (NIH): 67 mg/dL (ref 0–99)
Triglycerides: 164 mg/dL — ABNORMAL HIGH (ref 0–149)
VLDL Cholesterol Cal: 28 mg/dL (ref 5–40)

## 2021-05-12 LAB — BASIC METABOLIC PANEL
BUN/Creatinine Ratio: 11 (ref 10–24)
BUN: 13 mg/dL (ref 8–27)
CO2: 27 mmol/L (ref 20–29)
Calcium: 9.3 mg/dL (ref 8.6–10.2)
Chloride: 98 mmol/L (ref 96–106)
Creatinine, Ser: 1.21 mg/dL (ref 0.76–1.27)
Glucose: 108 mg/dL — ABNORMAL HIGH (ref 70–99)
Potassium: 3.6 mmol/L (ref 3.5–5.2)
Sodium: 140 mmol/L (ref 134–144)
eGFR: 62 mL/min/{1.73_m2} (ref >59)

## 2021-05-12 LAB — POCT GLYCOSYLATED HEMOGLOBIN (HGB A1C): Hemoglobin A1C: 6.8 % — AB (ref 4.0–5.6)

## 2021-05-18 ENCOUNTER — Ambulatory Visit: Payer: TRICARE For Life (TFL)

## 2021-05-18 ENCOUNTER — Telehealth: Payer: Self-pay | Admitting: Pulmonary Disease

## 2021-05-18 DIAGNOSIS — J449 Chronic obstructive pulmonary disease, unspecified: Secondary | ICD-10-CM

## 2021-05-18 NOTE — Telephone Encounter (Signed)
Patient scheduled COPD consult 05/25/21 with Dr. Francine Graven.  Papers received from [place] has no recent cbc.  ATC Patient, but home number busy, and mobile number VM not set up.

## 2021-05-19 ENCOUNTER — Telehealth: Payer: Self-pay | Admitting: Family Medicine

## 2021-05-19 DIAGNOSIS — Z20822 Contact with and (suspected) exposure to covid-19: Secondary | ICD-10-CM | POA: Diagnosis not present

## 2021-05-19 NOTE — Telephone Encounter (Signed)
Called and informed labs were fine. - no change in treatement.

## 2021-05-20 NOTE — Telephone Encounter (Signed)
Called and spoke with Patient.  Patient stated he is coming to office 05/21/21 for cbc with diff. Cbc order placed.  Nothing further at this time.

## 2021-05-20 NOTE — Addendum Note (Signed)
Addended by: Jacquiline Doe on: 05/20/2021 09:56 AM   Modules accepted: Orders

## 2021-05-21 ENCOUNTER — Other Ambulatory Visit (INDEPENDENT_AMBULATORY_CARE_PROVIDER_SITE_OTHER): Payer: Medicare Other

## 2021-05-21 DIAGNOSIS — J449 Chronic obstructive pulmonary disease, unspecified: Secondary | ICD-10-CM

## 2021-05-21 LAB — CBC WITH DIFFERENTIAL/PLATELET
Basophils Absolute: 0.1 10*3/uL (ref 0.0–0.1)
Basophils Relative: 0.6 % (ref 0.0–3.0)
Eosinophils Absolute: 0.2 10*3/uL (ref 0.0–0.7)
Eosinophils Relative: 2.4 % (ref 0.0–5.0)
HCT: 37 % — ABNORMAL LOW (ref 39.0–52.0)
Hemoglobin: 12.2 g/dL — ABNORMAL LOW (ref 13.0–17.0)
Lymphocytes Relative: 22.6 % (ref 12.0–46.0)
Lymphs Abs: 2.3 10*3/uL (ref 0.7–4.0)
MCHC: 33.1 g/dL (ref 30.0–36.0)
MCV: 96 fl (ref 78.0–100.0)
Monocytes Absolute: 1 10*3/uL (ref 0.1–1.0)
Monocytes Relative: 10.2 % (ref 3.0–12.0)
Neutro Abs: 6.6 10*3/uL (ref 1.4–7.7)
Neutrophils Relative %: 64.2 % (ref 43.0–77.0)
Platelets: 340 10*3/uL (ref 150.0–400.0)
RBC: 3.85 Mil/uL — ABNORMAL LOW (ref 4.22–5.81)
RDW: 13.3 % (ref 11.5–15.5)
WBC: 10.2 10*3/uL (ref 4.0–10.5)

## 2021-05-25 ENCOUNTER — Ambulatory Visit (INDEPENDENT_AMBULATORY_CARE_PROVIDER_SITE_OTHER): Payer: Medicare Other | Admitting: Pulmonary Disease

## 2021-05-25 ENCOUNTER — Encounter: Payer: Self-pay | Admitting: Pulmonary Disease

## 2021-05-25 ENCOUNTER — Other Ambulatory Visit: Payer: Self-pay

## 2021-05-25 VITALS — BP 138/64 | HR 75 | Temp 98.3°F | Ht 71.0 in | Wt 167.0 lb

## 2021-05-25 DIAGNOSIS — J449 Chronic obstructive pulmonary disease, unspecified: Secondary | ICD-10-CM | POA: Diagnosis not present

## 2021-05-25 DIAGNOSIS — R911 Solitary pulmonary nodule: Secondary | ICD-10-CM | POA: Diagnosis not present

## 2021-05-25 DIAGNOSIS — J9611 Chronic respiratory failure with hypoxia: Secondary | ICD-10-CM

## 2021-05-25 MED ORDER — MONTELUKAST SODIUM 10 MG PO TABS: 10.0000 mg | ORAL_TABLET | Freq: Every day | ORAL | 3 refills | Status: DC

## 2021-05-25 MED ORDER — PREDNISONE 10 MG PO TABS: ORAL_TABLET | ORAL | 0 refills | Status: AC

## 2021-05-25 NOTE — Patient Instructions (Addendum)
Prednisone Taper: 40mg  daily for 3 days 30 mg daily for 3 days 20mg  daily for 3 days Resume 10mg  daily as normal  Start montelukast 10mg  daily for sinus congestion and your breathing  We will order you a nebulizer machine via [employer] your oxygen company and albuterol solution to use every 4-6 hours as needed for shortness of breath, wheezing or cough  Continue azithromycin 3 days per week  We can consider referral to pulmonary rehab in the future.  We will schedule you for CT Chest scan for follow up of the lung nodule

## 2021-05-25 NOTE — Progress Notes (Signed)
Synopsis: Referred in November 2022 for COPD/Resp failure, previously followed at [facility] clinic  Subjective:   PATIENT ID: Michael Robertson. 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   He is currently on ICS/LAMA/LABA inhaler therapy, 70m prednisone daily, azithromycin 3 days per week and supplemental oxygen 2L at rest and 3L with exertion.  He is more short of breath over the last six months due to a covid infection in June and then another viral infection last month. He notices wheezing at night. He also has increased mucous production over the past couple of months.   Hot humid weather and allergies in the beginning of summer bother his breathing.   He is using flonase for sinus congestion and post nasal drainage as needed with good efficacy.    He has lost 10lbs in the last few months. He is more active due to helping his wife around the home more as she is dealing with a liver cancer diagnosis.   He has serial CT Chest scans to follow a pulmonary nodule which has been stable over the past year.   Past Medical History:  Diagnosis Date   Chronic kidney disease    COPD (chronic obstructive pulmonary disease) (HCC)    GERD (gastroesophageal reflux disease)    Hyperlipidemia    Hypertension    Insomnia    Substance abuse ([place] 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     Family History  Problem Relation Age of Onset   Alzheimer's disease Sister    Diabetes Sister    Dementia Sister    Stroke Mother    Dementia Mother    Diabetes Mother    Cancer Father        colon   Stroke Father    Obesity Daughter    Diabetes Daughter    Hypertension Daughter    Stroke Son      Social History   Socioeconomic History   Marital status:  Married    Spouse name: MRosaria Ferries   Number of children: 3   Years of education: 2 college   Highest education level: Not on file  Occupational History   Occupation: Retired- MActor RETIRED   Occupation: Retired-Ambulance person  Tobacco Use   Smoking status: Former    Packs/day: 1.75    Years: 40.00    Pack years: 70.00    Types: Cigarettes    Quit date: 03/01/2004    Years since quitting: 17.2   Smokeless tobacco: Never  Vaping Use   Vaping Use: Never used  Substance and Sexual Activity   Alcohol use: No   Drug use: No   Sexual activity: Yes  Other Topics Concern   Not on file  Social History Narrative   [place]    Patient has 3 children. [place] daughter [place] in [place] son [place] and another daughter he has never met.    Patient has a dog named Simba.    Patient enjoys watching TV, hanging with his dog, and visiting family on Sundays.    Social Determinants of Health   Financial Resource Strain: Not on file  Food Insecurity:  Not on file  Transportation Needs: Not on file  Physical Activity: Not on file  Stress: Not on file  Social Connections: Not on file  Intimate Partner Violence: Not on file     Allergies  Allergen Reactions   Doxycycline     REACTION: vomiting     Outpatient Medications Prior to Visit  Medication Sig Dispense Refill   azithromycin (ZITHROMAX) 250 MG tablet Take two tablets by mouth initially then one tablet daily for 4 more days. (Patient taking differently: Take 250 mg by mouth. Three times per week.) 6 tablet 0   bisoprolol (ZEBETA) 5 MG tablet TAKE 1 TABLET DAILY 90 tablet 3   cholecalciferol (VITAMIN D) 1000 units tablet Take 1,000 Units by mouth daily.     empagliflozin (JARDIANCE) 10 MG TABS tablet Take 1 tablet (10 mg total) by mouth daily. 90 tablet 3   fluticasone (FLONASE) 50 MCG/ACT nasal spray USE 2 SPRAYS NASALLY DAILY 32 g 5   guaiFENesin-codeine 100-10 MG/5ML syrup TAKE 5  MLS TWICE A DAY AS NEEDED FOR COUGH 120 mL 2   hydrochlorothiazide (HYDRODIURIL) 25 MG tablet TAKE 1 TABLET DAILY 90 tablet 3   mometasone (ASMANEX) 220 MCG/INH inhaler Inhale 2 puffs into the lungs at bedtime.     Multiple Vitamins-Minerals (CENTRUM SILVER ADULT 50+ PO) Take 1 tablet by mouth daily.      omeprazole (PRILOSEC) 40 MG capsule Take 1 capsule (40 mg total) by mouth daily. 90 capsule 3   predniSONE (DELTASONE) 5 MG tablet Take 5 mg by mouth daily with breakfast.     rosuvastatin (CRESTOR) 20 MG tablet TAKE 1 TABLET DAILY 90 tablet 3   sildenafil (VIAGRA) 100 MG tablet TAKE 1 TABLET AS NEEDED FOR ERECTILE DYSFUNCTION 18 tablet 3   tamsulosin (FLOMAX) 0.4 MG CAPS capsule TAKE 1 CAPSULE DAILY FOR PROSTATE 90 capsule 3   Tiotropium Bromide-Olodaterol 2.5-2.5 MCG/ACT AERS Inhale 2 puffs into the lungs daily.     traZODone (DESYREL) 100 MG tablet TAKE 1 TABLET AT BEDTIME 90 tablet 3   VENTOLIN HFA 108 (90 Base) MCG/ACT inhaler USE 2 INHALATIONS EVERY 4 HOURS AS NEEDED. RESCUE FOR SHORTNESS OF BREATH (Patient taking differently: Inhale 2 puffs into the lungs every 4 (four) hours as needed for wheezing.) 54 g 1   apixaban (ELIQUIS) 5 MG TABS tablet Take 1 tablet (5 mg total) by mouth 2 (two) times daily. 180 tablet 3   No facility-administered medications prior to visit.    Review of Systems  Constitutional:  Positive for weight loss. Negative for chills, fever and malaise/fatigue.  HENT:  Negative for congestion, sinus pain and sore throat.   Eyes: Negative.   Respiratory:  Positive for shortness of breath. Negative for cough, hemoptysis, sputum production and wheezing.   Cardiovascular:  Negative for chest pain, palpitations, orthopnea, claudication and leg swelling.  Gastrointestinal:  Negative for abdominal pain, heartburn, nausea and vomiting.  Genitourinary: Negative.   Musculoskeletal:  Negative for joint pain and myalgias.  Skin:  Negative for rash.  Neurological:  Negative for  weakness.  Endo/Heme/Allergies: Negative.   Psychiatric/Behavioral: Negative.     Objective:   Vitals:   05/25/21 1031  BP: 138/64  Pulse: 75  Temp: 98.3 F (36.8 C)  TempSrc: Oral  SpO2: 97%  Weight: 167 lb (75.8 kg)  Height: '5\' 11"'  (1.803 m)   Physical Exam Constitutional:      General: He is not in acute distress.    Comments: thin  HENT:  Head: Normocephalic and atraumatic.  Eyes:     Extraocular Movements: Extraocular movements intact.     Conjunctiva/sclera: Conjunctivae normal.     Pupils: Pupils are equal, round, and reactive to light.  Cardiovascular:     Rate and Rhythm: Normal rate and regular rhythm.     Pulses: Normal pulses.     Heart sounds: Normal heart sounds. No murmur heard. Pulmonary:     Effort: Pulmonary effort is normal.     Breath sounds: Decreased air movement present. Decreased breath sounds and wheezing (left base) present. No rhonchi or rales.  Abdominal:     General: Bowel sounds are normal.     Palpations: Abdomen is soft.  Musculoskeletal:     Right lower leg: No edema.     Left lower leg: No edema.  Lymphadenopathy:     Cervical: No cervical adenopathy.  Skin:    General: Skin is warm and dry.  Neurological:     General: No focal deficit present.     Mental Status: He is alert.  Psychiatric:        Mood and Affect: Mood normal.        Behavior: Behavior normal.        Thought Content: Thought content normal.        Judgment: Judgment normal.   CBC    Component Value Date/Time   WBC 10.2 05/21/2021 0934   RBC 3.85 (L) 05/21/2021 0934   HGB 12.2 (L) 05/21/2021 0934   HGB 13.0 11/03/2020 1033   HCT 37.0 (L) 05/21/2021 0934   HCT 40.0 11/03/2020 1033   PLT 340.0 05/21/2021 0934   PLT 301 11/03/2020 1033   MCV 96.0 05/21/2021 0934   MCV 96 11/03/2020 1033   MCH 31.3 11/03/2020 1033   MCH 30.6 10/09/2018 2349   MCHC 33.1 05/21/2021 0934   RDW 13.3 05/21/2021 0934   RDW 11.7 11/03/2020 1033   LYMPHSABS 2.3 05/21/2021  0934   MONOABS 1.0 05/21/2021 0934   EOSABS 0.2 05/21/2021 0934   BASOSABS 0.1 05/21/2021 0934   BMP Latest Ref Rng & Units 05/11/2021 11/03/2020 05/08/2020  Glucose 70 - 99 mg/dL 108(H) 108(H) 105(H)  BUN 8 - 27 mg/dL '13 17 20  ' Creatinine 0.76 - 1.27 mg/dL 1.21 1.22 1.42(H)  BUN/Creat Ratio 10 - '24 11 14 14  ' Sodium 134 - 144 mmol/L 140 143 141  Potassium 3.5 - 5.2 mmol/L 3.6 4.1 3.8  Chloride 96 - 106 mmol/L 98 100 100  CO2 20 - 29 mmol/L '27 26 24  ' Calcium 8.6 - 10.2 mg/dL 9.3 9.3 9.4   Chest imaging: CT Chest 2021 42m LLL nodule  CXR 2019 Emphysema, bullous changes  PFT: No flowsheet data found. 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   He is to continue on supplemental oxygen 2L at rest and 3L with exertion. He is to continue oxygen use at night when sleeping.   He is to continue on stiolto and asmanex inhalers daily. We will order him a nebulizer  machine and albuterol nebulizer solution to use PRN. We will consider changing him to long acting ICS/LAMA/LABA nebulizer treatments based on his previous PFT results as he has reduced FVC which may limit his ability to use inhalers. He is to continue azithromycin 3 days per week. We will provide him with steroid taper due to wheezing on exam start at 24m and will taper back down to his daily 169mdaily.   We will start patient on montelukast and monitor for any symptom improvement.   We discussed the possibility for pulmonary  rehab which he wants to hold off with at this time. His wife has been diagnosed with liver cancer so they are very busy between all of their doctors appointments.  We will schedule him for CT chest for lung nodule monitoring.   Follow up in 6 months.   JoFreda JacksonMD [facility] Office: [phone] Current Outpatient Medications:    azithromycin (ZITHROMAX) 250 MG tablet, Take two tablets by mouth initially then one tablet daily for 4 more days. (Patient taking differently: Take 250 mg by mouth. Three times per week.), Disp: 6 tablet, Rfl: 0   bisoprolol (ZEBETA) 5 MG tablet, TAKE 1 TABLET DAILY, Disp: 90 tablet, Rfl: 3   cholecalciferol (VITAMIN D) 1000 units tablet, Take 1,000 Units by mouth daily., Disp: , Rfl:    empagliflozin (JARDIANCE) 10 MG TABS tablet, Take 1 tablet (10 mg total) by mouth daily., Disp: 90 tablet, Rfl: 3   fluticasone (FLONASE) 50 MCG/ACT nasal spray, USE 2 SPRAYS NASALLY DAILY, Disp: 32 g, Rfl: 5   guaiFENesin-codeine 100-10 MG/5ML syrup, TAKE 5 MLS TWICE A DAY AS NEEDED FOR COUGH, Disp: 120 mL, Rfl: 2   hydrochlorothiazide (HYDRODIURIL) 25 MG tablet, TAKE 1 TABLET DAILY, Disp: 90 tablet, Rfl: 3   mometasone (ASMANEX) 220 MCG/INH inhaler, Inhale 2 puffs into the lungs at bedtime., Disp: , Rfl:    montelukast (SINGULAIR) 10 MG tablet, Take 1 tablet (10 mg total) by mouth at bedtime., Disp: 90 tablet, Rfl: 3   Multiple Vitamins-Minerals (CENTRUM SILVER ADULT 50+ PO), Take 1 tablet by mouth daily. , Disp: , Rfl:    omeprazole (PRILOSEC) 40 MG capsule, Take 1 capsule (40 mg total) by mouth daily., Disp: 90 capsule, Rfl: 3   predniSONE (DELTASONE) 10 MG tablet, Take 4 tablets (40 mg total) by mouth daily with breakfast for 3 days, THEN 3 tablets (30 mg total) daily with breakfast for 3 days, THEN 2 tablets (20 mg total) daily with breakfast for 3 days, THEN 1 tablet (10 mg total) daily with breakfast for 3 days., Disp: 30 tablet, Rfl: 0    predniSONE (DELTASONE) 5 MG tablet, Take 5 mg by mouth daily with breakfast., Disp: , Rfl:    rosuvastatin (CRESTOR) 20 MG tablet, TAKE 1 TABLET DAILY, Disp: 90 tablet, Rfl: 3   sildenafil (VIAGRA) 100 MG tablet, TAKE 1 TABLET AS NEEDED FOR ERECTILE DYSFUNCTION, Disp: 18 tablet, Rfl: 3   tamsulosin (FLOMAX) 0.4 MG CAPS capsule, TAKE 1 CAPSULE DAILY FOR PROSTATE, Disp: 90 capsule, Rfl: 3   Tiotropium Bromide-Olodaterol 2.5-2.5 MCG/ACT AERS, Inhale 2 puffs into the lungs daily., Disp: , Rfl:    traZODone (DESYREL) 100 MG tablet, TAKE 1 TABLET AT BEDTIME, Disp: 90 tablet, Rfl: 3   VENTOLIN HFA 108 (90 Base) MCG/ACT inhaler, USE 2 INHALATIONS EVERY 4 HOURS AS NEEDED. RESCUE FOR SHORTNESS OF BREATH (Patient taking differently: Inhale 2 puffs into the lungs every 4 (four) hours  as needed for wheezing.), Disp: 54 g, Rfl: 1   apixaban (ELIQUIS) 5 MG TABS tablet, Take 1 tablet (5 mg total) by mouth 2 (two) times daily., Disp: 180 tablet, Rfl: 3

## 2021-05-27 ENCOUNTER — Telehealth: Payer: Self-pay | Admitting: Pulmonary Disease

## 2021-05-27 NOTE — Telephone Encounter (Signed)
We will order you a nebulizer machine via [employer] your oxygen company and albuterol solution to use every 4-6 hours as needed for shortness of breath, wheezing or cough    I have attempted to call Michael Robertson at [facility] but was on hold for a long period of time.  Will try back later today.

## 2021-05-28 MED ORDER — ALBUTEROL SULFATE (2.5 MG/3ML) 0.083% IN NEBU: 2.5000 mg | INHALATION_SOLUTION | Freq: Four times a day (QID) | RESPIRATORY_TRACT | 6 refills | Status: DC | PRN

## 2021-05-28 NOTE — Telephone Encounter (Signed)
Called and spoke with Aurther Loft at [facility] and she stated that she will need an rx for the albuterol faxed to [phone] in order to get the meds out to the pt.  I have placed the order for the albuterol if someone can print it off and fax it.  thanks

## 2021-05-28 NOTE — Telephone Encounter (Signed)
Printed order and placed in Dr. Lanora Manis box awaiting signature.  Will route to Cherina to follow up with.

## 2021-05-29 NOTE — Telephone Encounter (Signed)
Terri states sent CMN for patient's medication. Needs doctor to sign. Terri phone number is [phone].

## 2021-05-29 NOTE — Telephone Encounter (Signed)
RX has been signed. Will fax to [facility] and close encounter.

## 2021-06-16 ENCOUNTER — Ambulatory Visit
Admission: RE | Admit: 2021-06-16 | Discharge: 2021-06-16 | Disposition: A | Discharge status: Home / Self Care | Payer: No Typology Code available for payment source | Source: Ambulatory Visit | Attending: Pulmonary Disease | Admitting: Pulmonary Disease

## 2021-06-16 ENCOUNTER — Other Ambulatory Visit: Payer: Self-pay

## 2021-06-16 DIAGNOSIS — R918 Other nonspecific abnormal finding of lung field: Secondary | ICD-10-CM | POA: Diagnosis not present

## 2021-06-16 DIAGNOSIS — R911 Solitary pulmonary nodule: Secondary | ICD-10-CM

## 2021-06-16 DIAGNOSIS — J439 Emphysema, unspecified: Secondary | ICD-10-CM | POA: Diagnosis not present

## 2021-06-16 DIAGNOSIS — I700 Atherosclerosis of aorta: Secondary | ICD-10-CM | POA: Diagnosis not present

## 2021-07-30 ENCOUNTER — Other Ambulatory Visit: Payer: Self-pay | Admitting: Family Medicine

## 2021-07-30 DIAGNOSIS — K219 Gastro-esophageal reflux disease without esophagitis: Secondary | ICD-10-CM

## 2021-09-18 DIAGNOSIS — J449 Chronic obstructive pulmonary disease, unspecified: Secondary | ICD-10-CM | POA: Diagnosis not present

## 2021-09-28 ENCOUNTER — Other Ambulatory Visit: Payer: Self-pay | Admitting: Family Medicine

## 2021-09-28 DIAGNOSIS — E7800 Pure hypercholesterolemia, unspecified: Secondary | ICD-10-CM

## 2021-09-30 ENCOUNTER — Ambulatory Visit (INDEPENDENT_AMBULATORY_CARE_PROVIDER_SITE_OTHER): Payer: Medicare PPO

## 2021-09-30 ENCOUNTER — Other Ambulatory Visit: Payer: Self-pay

## 2021-09-30 DIAGNOSIS — Z0000 Encounter for general adult medical examination without abnormal findings: Secondary | ICD-10-CM | POA: Diagnosis not present

## 2021-09-30 NOTE — Progress Notes (Signed)
? ?Subjective:  ? Michael Holthaus. is a 76 y.o. male who presents for Medicare Annual/Subsequent preventive examination. ? ?The patient consented to a virtual visit. ?Patient consented to have virtual visit and was identified by name and date of birth. ?Method of visit: Telephone ? ?Encounter participants: ?Patient: Michael Robertson. - located at Home ?Nurse/Provider: Dorna Bloom - located at [facility] ?Others (if applicable): NA ? ?Review of Systems: Defer to PCP ? ?Cardiac Risk Factors include: advanced age (>40men, >8 women);diabetes mellitus;hypertension;male gender ? ?Objective:  ? ?Vitals: There were no vitals taken for this visit.  There is no height or weight on file to calculate BMI. ? ?Advanced Directives 09/30/2021 05/11/2021 01/24/2020 04/12/2019 02/21/2019 04/05/2018 11/23/2017  ?Does Patient Have a Medical Advance Directive? Yes No No No No No No  ?Type of Paramedic of [place];Living will - - - - - -  ?Does patient want to make changes to medical advance directive? No - Patient declined - - - - - -  ?Copy of [place] in Chart? Yes - validated most recent copy scanned in chart (See row information) - - - - - -  ?Would patient like information on creating a medical advance directive? - No - Patient declined No - Patient declined No - Patient declined Yes (MAU/Ambulatory/Procedural Areas - Information given) No - Patient declined No - Patient declined  ? ?Tobacco ?Social History  ? ?Tobacco Use  ?Smoking Status Former  ? Packs/day: 1.75  ? Years: 40.00  ? Pack years: 70.00  ? Types: Cigarettes  ? Quit date: 03/01/2004  ? Years since quitting: 17.5  ? Passive exposure: Past  ?Smokeless Tobacco Never  ?   ?Counseling given: No plans to restart. ? ?Clinical Intake: ? ?Pre-visit preparation completed: Yes ? ?Diabetes: Yes ? ?How often do you need to have someone help you when you read instructions, pamphlets, or other written materials from your doctor or pharmacy?: 2 -  Rarely ?What is the last grade level you completed in school?: High School ? ?Past Medical History:  ?Diagnosis Date  ? Chronic kidney disease   ? COPD (chronic obstructive pulmonary disease) ([place])   ? GERD (gastroesophageal reflux disease)   ? Hyperlipidemia   ? Hypertension   ? Insomnia   ? Oxygen deficiency   ? Substance abuse ([place]) 1970  ? Heroin  ? ?History reviewed. No pertinent surgical history. ?Family History  ?Problem Relation Age of Onset  ? Stroke Mother   ? Dementia Mother   ? Diabetes Mother   ? Cancer Father   ?     colon  ? Stroke Father   ? Alzheimer's disease Sister   ? Diabetes Sister   ? Dementia Sister   ? Obesity Daughter   ? Diabetes Daughter   ? Hypertension Daughter   ? Stroke Son   ? ?Social History  ? ?Socioeconomic History  ? Marital status: Widowed  ?  Spouse name: Rosaria Ferries- passed away Sep 05, 2021  ? Number of children: 2  ? Years of education: 67- some college  ? Highest education level: 12th grade  ?Occupational History  ? Occupation: Retired- [employer]  ?  Employer: RETIRED  ? Occupation: RetiredAmbulance person   ?Tobacco Use  ? Smoking status: Former  ?  Packs/day: 1.75  ?  Years: 40.00  ?  Pack years: 70.00  ?  Types: Cigarettes  ?  Quit date: 03/01/2004  ?  Years since quitting: 17.5  ?  Passive exposure: Past  ? Smokeless tobacco: Never  ?Vaping Use  ? Vaping Use: Never used  ?Substance and Sexual Activity  ? Alcohol use: No  ? Drug use: No  ?  Comment: Hx of use  ? Sexual activity: Not Currently  ?Other Topics Concern  ? Not on file  ?Social History Narrative  ? Patient lives with his son.   ? His wife recent passed away in 13-Sep-2021.  ? Patient does drive occasionally. His son does must of his shopping.   ? Patient is on home oxygen. Patient uses a scooter when out of his home.   ? ?Social Determinants of Health  ? ?Financial Resource Strain: Low Risk   ? Difficulty of Paying Living Expenses: Not hard at all  ?Food Insecurity: No Food Insecurity  ? Worried About Charity fundraiser in the  Last Year: Never true  ? Ran Out of Food in the Last Year: Never true  ?Transportation Needs: No Transportation Needs  ? Lack of Transportation (Medical): No  ? Lack of Transportation (Non-Medical): No  ?Physical Activity: Inactive  ? Days of Exercise per Week: 0 days  ? Minutes of Exercise per Session: 0 min  ?Stress: Stress Concern Present  ? Feeling of Stress : To some extent  ?Social Connections: Socially Isolated  ? Frequency of Communication with Friends and Family: More than three times a week  ? Frequency of Social Gatherings with Friends and Family: More than three times a week  ? Attends Religious Services: Never  ? Active Member of Clubs or Organizations: No  ? Attends Archivist Meetings: Never  ? Marital Status: Widowed  ? ?Outpatient Encounter Medications as of 09/30/2021  ?Medication Sig  ? albuterol (PROVENTIL) (2.5 MG/3ML) 0.083% nebulizer solution Take 3 mLs (2.5 mg total) by nebulization every 6 (six) hours as needed for wheezing or shortness of breath.  ? azithromycin (ZITHROMAX) 250 MG tablet Take two tablets by mouth initially then one tablet daily for 4 more days. (Patient taking differently: Take 250 mg by mouth. Three times per week.)  ? bisoprolol (ZEBETA) 5 MG tablet TAKE 1 TABLET DAILY  ? cholecalciferol (VITAMIN D) 1000 units tablet Take 1,000 Units by mouth daily.  ? guaiFENesin-codeine 100-10 MG/5ML syrup TAKE 5 MLS TWICE A DAY AS NEEDED FOR COUGH  ? hydrochlorothiazide (HYDRODIURIL) 25 MG tablet TAKE 1 TABLET DAILY  ? mometasone (ASMANEX) 220 MCG/INH inhaler Inhale 2 puffs into the lungs at bedtime.  ? montelukast (SINGULAIR) 10 MG tablet Take 1 tablet (10 mg total) by mouth at bedtime.  ? Multiple Vitamins-Minerals (CENTRUM SILVER ADULT 50+ PO) Take 1 tablet by mouth daily.   ? omeprazole (PRILOSEC) 40 MG capsule TAKE 1 CAPSULE DAILY  ? predniSONE (DELTASONE) 5 MG tablet Take 5 mg by mouth daily with breakfast.  ? rosuvastatin (CRESTOR) 20 MG tablet TAKE 1 TABLET DAILY  ?  sildenafil (VIAGRA) 100 MG tablet TAKE 1 TABLET AS NEEDED FOR ERECTILE DYSFUNCTION  ? tamsulosin (FLOMAX) 0.4 MG CAPS capsule TAKE 1 CAPSULE DAILY FOR PROSTATE  ? Tiotropium Bromide-Olodaterol 2.5-2.5 MCG/ACT AERS Inhale 2 puffs into the lungs daily.  ? traZODone (DESYREL) 100 MG tablet TAKE 1 TABLET AT BEDTIME  ? VENTOLIN HFA 108 (90 Base) MCG/ACT inhaler USE 2 INHALATIONS EVERY 4 HOURS AS NEEDED. RESCUE FOR SHORTNESS OF BREATH (Patient taking differently: Inhale 2 puffs into the lungs every 4 (four) hours as needed for wheezing.)  ? apixaban (ELIQUIS) 5 MG TABS tablet Take 1  tablet (5 mg total) by mouth 2 (two) times daily.  ? empagliflozin (JARDIANCE) 10 MG TABS tablet Take 1 tablet (10 mg total) by mouth daily. (Patient not taking: Reported on 09/30/2021)  ? fluticasone (FLONASE) 50 MCG/ACT nasal spray USE 2 SPRAYS NASALLY DAILY (Patient not taking: Reported on 09/30/2021)  ? ?No facility-administered encounter medications on file as of 09/30/2021.  ? ?Activities of Daily Living ?In your present state of health, do you have any difficulty performing the following activities: 09/30/2021  ?Hearing? N  ?Vision? N  ?Difficulty concentrating or making decisions? N  ?Walking or climbing stairs? Y  ?Dressing or bathing? N  ?Doing errands, shopping? N  ?Preparing Food and eating ? N  ?Using the Toilet? N  ?In the past six months, have you accidently leaked urine? N  ?Do you have problems with loss of bowel control? N  ?Managing your Medications? N  ?Managing your Finances? N  ?Housekeeping or managing your Housekeeping? N  ?Some recent data might be hidden  ? ?Patient Care Team: ?Zenia Resides, MD as PCP - General ?Freddi Starr, MD as Consulting Physician (Pulmonary Disease) ?Awanda Mink, MD as Consulting Physician (Ophthalmology)   ?Patient see cardiology at the [place].  ?Assessment:  ? This is a routine wellness examination for Bb. ? ?Exercise Activities and Dietary recommendations ?Current Exercise  Habits: The patient does not participate in regular exercise at present, Exercise limited by: respiratory conditions(s);cardiac condition(s) ? ? Goals   ? ?  HEMOGLOBIN A1C < 7   ?  05/12/2021 A1c 6.8 ?  ?  Weigh

## 2021-09-30 NOTE — Progress Notes (Signed)
I have reviewed this visit and agree with the documentation.   

## 2021-09-30 NOTE — Patient Instructions (Addendum)
Thank you for taking time to come for your Medicare Wellness Visit. I appreciate your ongoing commitment to your health goals. Please review the following plan we discussed and let me know if I can assist you in the future.  ?  ?These are the goals we discussed: ? ? Goals   ? ?  HEMOGLOBIN A1C < 7   ?  05/12/2021 A1c 6.8 ?  ?  Weight < 160 lb (72.576 kg)   ? ?  ? ?We also discussed recommended health maintenance. As discussed, you are due for: ?Health Maintenance  ?Topic Date Due  ? OPHTHALMOLOGY EXAM  Never done  ? URINE MICROALBUMIN  Never done  ? FOOT EXAM  11/11/2020  ? HEMOGLOBIN A1C  11/10/2021  ? TETANUS/TDAP  09/29/2023  ? Pneumonia Vaccine 57+ Years old  Completed  ? INFLUENZA VACCINE  Completed  ? COVID-19 Vaccine  Completed  ? Hepatitis C Screening  Completed  ? Zoster Vaccines- Shingrix  Completed  ? HPV VACCINES  Aged Out  ? COLONOSCOPY (Pts 45-72yrs Insurance coverage will need to be confirmed)  Discontinued  ? ?PCP apt scheduled for 10/07/2021. ?[facility] apt 10/13/2021. ?Eye exam due- or need records from [place]. ? ?Preventive Care 54 Years and Older, Male ?Preventive care refers to lifestyle choices and visits with your health care provider that can promote health and wellness. Preventive care visits are also called wellness exams. ?What can I expect for my preventive care visit? ?Counseling ?During your preventive care visit, your health care provider may ask about your: ?Medical history, including: ?Past medical problems. ?Family medical history. ?History of falls. ?Current health, including: ?Emotional well-being. ?Home life and relationship well-being. ?Sexual activity. ?Memory and ability to understand (cognition). ?Lifestyle, including: ?Alcohol, nicotine or tobacco, and drug use. ?Access to firearms. ?Diet, exercise, and sleep habits. ?Work and work Astronomer. ?Sunscreen use. ?Safety issues such as seatbelt and bike helmet use. ?Physical exam ?Your health care provider will check your: ?Height and  weight. These may be used to calculate your BMI (body mass index). BMI is a measurement that tells if you are at a healthy weight. ?Waist circumference. This measures the distance around your waistline. This measurement also tells if you are at a healthy weight and may help predict your risk of certain diseases, such as type 2 diabetes and high blood pressure. ?Heart rate and blood pressure. ?Body temperature. ?Skin for abnormal spots. ?What immunizations do I need? ?Vaccines are usually given at various ages, according to a schedule. Your health care provider will recommend vaccines for you based on your age, medical history, and lifestyle or other factors, such as travel or where you work. ?What tests do I need? ?Screening ?Your health care provider may recommend screening tests for certain conditions. This may include: ?Lipid and cholesterol levels. ?Diabetes screening. This is done by checking your blood sugar (glucose) after you have not eaten for a while (fasting). ?Hepatitis C test. ?Hepatitis B test. ?HIV (human immunodeficiency virus) test. ?STI (sexually transmitted infection) testing, if you are at risk. ?Lung cancer screening. ?Colorectal cancer screening. ?Prostate cancer screening. ?Abdominal aortic aneurysm (AAA) screening. You may need this if you are a current or former smoker. ?Talk with your health care provider about your test results, treatment options, and if necessary, the need for more tests. ?Follow these instructions at home: ?Eating and drinking ? ?Eat a diet that includes fresh fruits and vegetables, whole grains, lean protein, and low-fat dairy products. Limit your intake of foods with high  amounts of sugar, saturated fats, and salt. ?Take vitamin and mineral supplements as recommended by your health care provider. ?Do not drink alcohol if your health care provider tells you not to drink. ?If you drink alcohol: ?Limit how much you have to 0-2 drinks a day. ?Know how much alcohol is in  your drink. In the [place] one drink equals one 12 oz bottle of beer (355 mL), one 5 oz glass of wine (148 mL), or one 1? oz glass of hard liquor (44 mL). ?Lifestyle ?Brush your teeth every morning and night with fluoride toothpaste. Floss one time each day. ?Exercise for at least 30 minutes 5 or more days each week. ?Do not use any products that contain nicotine or tobacco. These products include cigarettes, chewing tobacco, and vaping devices, such as e-cigarettes. If you need help quitting, ask your health care provider. ?Do not use drugs. ?If you are sexually active, practice safe sex. Use a condom or other form of protection to prevent STIs. ?Take aspirin only as told by your health care provider. Make sure that you understand how much to take and what form to take. Work with your health care provider to find out whether it is safe and beneficial for you to take aspirin daily. ?Ask your health care provider if you need to take a cholesterol-lowering medicine (statin). ?Find healthy ways to manage stress, such as: ?Meditation, yoga, or listening to music. ?Journaling. ?Talking to a trusted person. ?Spending time with friends and family. ?Safety ?Always wear your seat belt while driving or riding in a vehicle. ?Do not drive: ?If you have been drinking alcohol. Do not ride with someone who has been drinking. ?When you are tired or distracted. ?While texting. ?If you have been using any mind-altering substances or drugs. ?Wear a helmet and other protective equipment during sports activities. ?If you have firearms in your house, make sure you follow all gun safety procedures. ?Minimize exposure to UV radiation to reduce your risk of skin cancer. ?What's next? ?Visit your health care provider once a year for an annual wellness visit. ?Ask your health care provider how often you should have your eyes and teeth checked. ?Stay up to date on all vaccines. ?This information is not intended to replace advice given to you by  your health care provider. Make sure you discuss any questions you have with your health care provider. ?Document Revised: 12/31/2020 Document Reviewed: 12/31/2020 ?Elsevier Patient Education ? 2022 [employer] ? ?Our clinic's number is [phone]. Please call with questions or concerns about what we discussed today.  ?  ?

## 2021-10-07 ENCOUNTER — Other Ambulatory Visit: Payer: Self-pay

## 2021-10-07 ENCOUNTER — Ambulatory Visit (INDEPENDENT_AMBULATORY_CARE_PROVIDER_SITE_OTHER): Payer: Medicare PPO | Admitting: Family Medicine

## 2021-10-07 VITALS — BP 124/56 | HR 98 | Ht 71.0 in | Wt 159.6 lb

## 2021-10-07 DIAGNOSIS — N1831 Chronic kidney disease, stage 3a: Secondary | ICD-10-CM

## 2021-10-07 DIAGNOSIS — I480 Paroxysmal atrial fibrillation: Secondary | ICD-10-CM | POA: Diagnosis not present

## 2021-10-07 DIAGNOSIS — J9611 Chronic respiratory failure with hypoxia: Secondary | ICD-10-CM

## 2021-10-07 DIAGNOSIS — E1122 Type 2 diabetes mellitus with diabetic chronic kidney disease: Secondary | ICD-10-CM | POA: Diagnosis not present

## 2021-10-07 DIAGNOSIS — Z515 Encounter for palliative care: Secondary | ICD-10-CM | POA: Diagnosis not present

## 2021-10-07 DIAGNOSIS — I10 Essential (primary) hypertension: Secondary | ICD-10-CM

## 2021-10-07 DIAGNOSIS — J441 Chronic obstructive pulmonary disease with (acute) exacerbation: Secondary | ICD-10-CM | POA: Diagnosis not present

## 2021-10-07 LAB — POCT GLYCOSYLATED HEMOGLOBIN (HGB A1C): HbA1c, POC (controlled diabetic range): 7 % (ref 0.0–7.0)

## 2021-10-07 MED ORDER — PREDNISONE 20 MG PO TABS: 40.0000 mg | ORAL_TABLET | Freq: Every day | ORAL | 0 refills | Status: DC

## 2021-10-07 MED ORDER — LEVOFLOXACIN 500 MG PO TABS: 500.0000 mg | ORAL_TABLET | Freq: Every day | ORAL | 0 refills | Status: DC

## 2021-10-07 NOTE — Patient Instructions (Signed)
Stop the hydrochlorothiazide - a fluid/blood pressure pill.  With your weight loss, I don't think you need it anymore. ?I want to bump up your prednisone dose for five days.  Then go back on the prednisone 5 mg daily that you have been taking ?I want to switch you to a different antibiotic, levofloxicin, for one week.  Then go back to the azithromicin 3 days per week.  Stop the azithro while you are on the levofloxicin. ?See me next week.   ?

## 2021-10-08 ENCOUNTER — Encounter: Payer: Self-pay | Admitting: Family Medicine

## 2021-10-08 NOTE — Assessment & Plan Note (Signed)
Sounds like he is likely having more PAF spells.  Continue beta blocker for rate control.  Cont apixiban.   ?

## 2021-10-08 NOTE — Assessment & Plan Note (Signed)
Revisited goals of care which are unchanged.   ?

## 2021-10-08 NOTE — Progress Notes (Signed)
? ? ?  SUBJECTIVE:  ? ?CHIEF COMPLAINT / HPI:  ? ?While Mr. Brossman is generally upbeat, it has been a tough several months for him.  His wife died ~94month ago from liver cancer.  He admittedly did not attend to his own health as he cared for her in the dying process.  He has had worsening dyspnea on minimal exertion.  He is not eating well.  He is sleeping less well.  On med rec, he has not been taking his jardiance.  Because of polyuria, "It was not a good medicine for me." ?He notes having rapid heart beat more frequently.  Denies CP or ankle swelling. ?Of course, he is grieving the loss of his wife.  On a positive note, He has good family support. ?End-of-life planning: He continues to want brief supportive treatment but not prolonged aggressive support.  He has a living will.  His family members know his wishes.   ? ? ?OBJECTIVE:  ? ?BP (!) 124/56   Pulse 98   Ht 5\' 11"  (1.803 m)   Wt 159 lb 9.6 oz (72.4 kg)   SpO2 (!) 87%   BMI 22.26 kg/m?   ?On VS note lowish BP, low pulse ox, and wt loss.   ?Lungs quiet with poor air movement ?Cardiac RRR without m or g ?Ext no edema. ? ?ASSESSMENT/PLAN:  ? ?DM (diabetes mellitus), type 2 with renal complications ([place]) ?A1C= 7.0 off all diabetic meds.  I will not add anything due to end stage lung disease. ? ?HYPERTENSION, BENIGN SYSTEMIC ?Likely over treated now with weight loss.  Stop HCTZ ? ?COPD exacerbation ([place]) ?I am not sure how much hypoxia is an exacerbation versus progression to end stage lung disease.  Treat as an exacerbation with FU in one week.   ? ?Palliative care encounter ?Revisited goals of care which are unchanged.   ? ?PAF (paroxysmal atrial fibrillation) ([place]) ?Sounds like he is likely having more PAF spells.  Continue beta blocker for rate control.  Cont apixiban.   ?  ? ? ?Zenia Resides, MD ?[place]  ?

## 2021-10-08 NOTE — Assessment & Plan Note (Signed)
Likely over treated now with weight loss.  Stop HCTZ ?

## 2021-10-08 NOTE — Assessment & Plan Note (Signed)
I am not sure how much hypoxia is an exacerbation versus progression to end stage lung disease.  Treat as an exacerbation with FU in one week.   ?

## 2021-10-08 NOTE — Assessment & Plan Note (Signed)
A1C= 7.0 off all diabetic meds.  I will not add anything due to end stage lung disease. ?

## 2021-10-12 ENCOUNTER — Encounter: Payer: Self-pay | Admitting: Family Medicine

## 2021-10-12 ENCOUNTER — Other Ambulatory Visit: Payer: Self-pay

## 2021-10-12 ENCOUNTER — Ambulatory Visit (INDEPENDENT_AMBULATORY_CARE_PROVIDER_SITE_OTHER): Payer: Medicare PPO | Admitting: Family Medicine

## 2021-10-12 DIAGNOSIS — J439 Emphysema, unspecified: Secondary | ICD-10-CM | POA: Diagnosis not present

## 2021-10-12 NOTE — Progress Notes (Signed)
? ? ?  SUBJECTIVE:  ? ?CHIEF COMPLAINT / HPI:  ? ?FU COPD.  Seen last week with a presumed COPD exacerbation.  (The other possible explanation of his symptoms was that his COPD had progressed to the markedly symptomatic standpoint.)  Fortunately, he feels better this week.  Pulse ox is markedly improved.  He is happy to complete his prednisone today since the high dose makes him jittery.  He knows to go back on his chronic daily dose of prednisone.  He has two more days of levofloxin.  He is able to be more active.  He attributes his improvement primarily to the antibiotics.  Since both pred and levofloxin were started at the same time, it is impossible to be certain.  Looking forward to a two week visit with his daughter.  He continues to have good family support since the death of his wife. ? ? ? ?OBJECTIVE:  ? ?BP (!) 142/60   Pulse 86   Wt 160 lb 3.2 oz (72.7 kg)   SpO2 98%   BMI 22.34 kg/m?   ?Lungs Still poor air movement.  No tachypnea at rest.  Improved pulse ox at rest. ? ?ASSESSMENT/PLAN:  ? ?COPD (chronic obstructive pulmonary disease) with emphysema ([facility]) ?I am pleased with the improvement and still quite concerned about his severe COPD.  No change in therapy.  He knows to come in for early symptoms because he is at high risk for rapid decompensation. ?  ? ? ?Michael Manners, MD ?[facility]  ?

## 2021-10-12 NOTE — Assessment & Plan Note (Signed)
I am pleased with the improvement and still quite concerned about his severe COPD.  No change in therapy.  He knows to come in for early symptoms because he is at high risk for rapid decompensation. ?

## 2021-10-12 NOTE — Patient Instructions (Signed)
I am glad you are feeling better.   ?I hope you stay better as we get you back on your regular medications. ?See me anytime you feel yourself slipping backward.   ?Enjoy your better food with your daughter's visit.   ?

## 2021-10-19 DIAGNOSIS — J449 Chronic obstructive pulmonary disease, unspecified: Secondary | ICD-10-CM | POA: Diagnosis not present

## 2021-10-23 ENCOUNTER — Other Ambulatory Visit: Payer: Self-pay | Admitting: Family Medicine

## 2021-11-10 ENCOUNTER — Telehealth: Payer: Self-pay | Admitting: Pulmonary Disease

## 2021-11-10 NOTE — Telephone Encounter (Signed)
Received a fax from the [place] authorizing follow up visits for the patient. Authorization was approved on 11/07/21 and expires on 05/06/22. Authorization number: [id number].  ? ?Patient was last seen 05/25/22 and told to follow up in 6 months after a repeat CT scan.  ? ?I called the patient to see how he was doing but he did not answer. Left a message for him to call us back.  ? ?Dr. Francine Graven, do you still want him to have the repeat CT and F/U visit next month?  ?

## 2021-11-12 DIAGNOSIS — J449 Chronic obstructive pulmonary disease, unspecified: Secondary | ICD-10-CM | POA: Diagnosis not present

## 2021-11-18 DIAGNOSIS — J449 Chronic obstructive pulmonary disease, unspecified: Secondary | ICD-10-CM | POA: Diagnosis not present

## 2021-11-20 NOTE — Telephone Encounter (Signed)
Called patient but he did not answer. Left message for him to call back.  

## 2021-12-01 ENCOUNTER — Ambulatory Visit: Payer: TRICARE For Life (TFL) | Admitting: Pulmonary Disease

## 2021-12-16 ENCOUNTER — Ambulatory Visit (INDEPENDENT_AMBULATORY_CARE_PROVIDER_SITE_OTHER): Payer: Medicare PPO | Admitting: Pulmonary Disease

## 2021-12-16 ENCOUNTER — Encounter: Payer: Self-pay | Admitting: Pulmonary Disease

## 2021-12-16 VITALS — BP 118/72 | HR 85 | Ht 71.0 in | Wt 158.0 lb

## 2021-12-16 DIAGNOSIS — J449 Chronic obstructive pulmonary disease, unspecified: Secondary | ICD-10-CM

## 2021-12-16 DIAGNOSIS — J9611 Chronic respiratory failure with hypoxia: Secondary | ICD-10-CM

## 2021-12-16 NOTE — Patient Instructions (Addendum)
Continue to use asmanex inhaler 2 puffs at bedtime  Continue stiolto inhaler 2 puffs daily  Start montelukast 10mg  daily for sinus congestion and your breathing   Continue azithromycin 3 days per week  Follow up in 6 months

## 2021-12-16 NOTE — Progress Notes (Signed)
Synopsis: Referred in November 2022 for COPD/Resp failure, previously followed at [facility] clinic  Subjective:   PATIENT ID: Michael Robertson. 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   He is using 3-4L of O2. He reports his wife passed from liver cancer in 09-21-2022 and he had let himself go for a bit where he had a harder time breathing. He was treated with levaquin which helped his breathing.   His step son has moved in with him.   He continues on asmanex nightly and stiolto daily along with prednisone 10mg  daily and azithromycin 3 days per week.   He reports his breathing is roughly at baseline but overall very short of breath.  OV 05/25/21 He is currently on ICS/LAMA/LABA inhaler therapy, 10mg  prednisone daily, azithromycin 3 days per week and supplemental oxygen 2L at rest and 3L with exertion.  He is more short of breath over the last six months due to a covid infection in June and then another viral infection last month. He notices wheezing at night. He also has increased mucous production over the past couple of months.   Hot humid weather and allergies in the beginning of summer bother his breathing.   He is using flonase for sinus congestion and post nasal drainage as needed with good efficacy.    He has lost 10lbs in the last few months. He is more active due to helping his wife around the home more as she is dealing with a liver cancer diagnosis.   He has serial CT Chest scans to follow a pulmonary nodule which has been stable over the past year.   Past Medical History:  Diagnosis Date   Chronic kidney disease    COPD (chronic obstructive pulmonary disease) (HCC)    GERD (gastroesophageal reflux disease)    Hyperlipidemia    Hypertension     Insomnia    Oxygen deficiency    Substance abuse ([place]) 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     Family History  Problem Relation Age of Onset   Stroke Mother    Dementia Mother    Diabetes Mother    Cancer Father        colon   Stroke Father    Alzheimer's disease Sister    Diabetes Sister    Dementia Sister    Obesity Daughter    Diabetes Daughter    Hypertension Daughter    Stroke Son      Social History   Socioeconomic History   Marital status: Widowed    Spouse name: Rosaria Ferries- passed away 2021/09/21   Number of children: 2   Years of education: 14- some college   Highest education level: 12th grade  Occupational History   Occupation: Retired- Actor: RETIRED   Occupation: RetiredAmbulance person   Tobacco Use   Smoking status: Former    Packs/day: 1.75    Years: 40.00    Pack years: 70.00    Types: Cigarettes    Quit date: 03/01/2004    Years since quitting: 17.8    Passive exposure: Past   Smokeless tobacco: Never  Vaping Use   Vaping Use: Never used  Substance and Sexual Activity   Alcohol use: No   Drug use: No  Comment: Hx of use   Sexual activity: Not Currently  Other Topics Concern   Not on file  Social History Narrative   Patient lives with his son.    His wife recent passed away in 09-24-21.   Patient does drive occasionally. His son does must of his shopping.    Patient is on home oxygen. Patient uses a scooter when out of his home.    Social Determinants of Health   Financial Resource Strain: Low Risk    Difficulty of Paying Living Expenses: Not hard at all  Food Insecurity: No Food Insecurity   Worried About Charity fundraiser in the Last Year: Never true   [place] in the Last Year: Never true  Transportation Needs: No Transportation Needs   Lack of Transportation (Medical): No   Lack of Transportation (Non-Medical): No  Physical Activity: Inactive   Days of Exercise per Week: 0 days   Minutes of Exercise per Session: 0 min  Stress: Stress  Concern Present   Feeling of Stress : To some extent  Social Connections: Socially Isolated   Frequency of Communication with Friends and Family: More than three times a week   Frequency of Social Gatherings with Friends and Family: More than three times a week   Attends Religious Services: Never   Marine scientist or Organizations: No   Attends Archivist Meetings: Never   Marital Status: Widowed  Human resources officer Violence: Not At Risk   Fear of Current or Ex-Partner: No   Emotionally Abused: No   Physically Abused: No   Sexually Abused: No     Allergies  Allergen Reactions   Doxycycline     REACTION: vomiting     Outpatient Medications Prior to Visit  Medication Sig Dispense Refill   albuterol (PROVENTIL) (2.5 MG/3ML) 0.083% nebulizer solution Take 3 mLs (2.5 mg total) by nebulization every 6 (six) hours as needed for wheezing or shortness of breath. 120 mL 6   azithromycin (ZITHROMAX) 250 MG tablet Take two tablets by mouth initially then one tablet daily for 4 more days. (Patient taking differently: Take 250 mg by mouth. Three times per week.) 6 tablet 0   bisoprolol (ZEBETA) 5 MG tablet TAKE 1 TABLET DAILY 90 tablet 3   cholecalciferol (VITAMIN D) 1000 units tablet Take 1,000 Units by mouth daily.     guaiFENesin-codeine 100-10 MG/5ML syrup TAKE 5 MLS TWICE A DAY AS NEEDED FOR COUGH 120 mL 2   mometasone (ASMANEX) 220 MCG/INH inhaler Inhale 2 puffs into the lungs at bedtime.     montelukast (SINGULAIR) 10 MG tablet Take 1 tablet (10 mg total) by mouth at bedtime. 90 tablet 3   Multiple Vitamins-Minerals (CENTRUM SILVER ADULT 50+ PO) Take 1 tablet by mouth daily.      omeprazole (PRILOSEC) 40 MG capsule TAKE 1 CAPSULE DAILY 90 capsule 3   predniSONE (DELTASONE) 5 MG tablet Take 5 mg by mouth daily with breakfast.     rosuvastatin (CRESTOR) 20 MG tablet TAKE 1 TABLET DAILY 90 tablet 3   sildenafil (VIAGRA) 100 MG tablet TAKE 1 TABLET AS NEEDED FOR ERECTILE  DYSFUNCTION 18 tablet 3   tamsulosin (FLOMAX) 0.4 MG CAPS capsule TAKE 1 CAPSULE DAILY FOR PROSTATE 90 capsule 3   Tiotropium Bromide-Olodaterol 2.5-2.5 MCG/ACT AERS Inhale 2 puffs into the lungs daily.     traZODone (DESYREL) 100 MG tablet TAKE 1 TABLET AT BEDTIME 90 tablet 3   VENTOLIN HFA 108 (  90 Base) MCG/ACT inhaler USE 2 INHALATIONS EVERY 4 HOURS AS NEEDED. RESCUE FOR SHORTNESS OF BREATH (Patient taking differently: Inhale 2 puffs into the lungs every 4 (four) hours as needed for wheezing.) 54 g 1   apixaban (ELIQUIS) 5 MG TABS tablet Take 1 tablet (5 mg total) by mouth 2 (two) times daily. 180 tablet 3   levofloxacin (LEVAQUIN) 500 MG tablet Take 1 tablet (500 mg total) by mouth daily. 7 tablet 0   No facility-administered medications prior to visit.    Review of Systems  Constitutional:  Negative for chills, fever, malaise/fatigue and weight loss.  HENT:  Negative for congestion, sinus pain and sore throat.   Eyes: Negative.   Respiratory:  Positive for shortness of breath. Negative for cough, hemoptysis, sputum production and wheezing.   Cardiovascular:  Negative for chest pain, palpitations, orthopnea, claudication and leg swelling.  Gastrointestinal:  Negative for abdominal pain, heartburn, nausea and vomiting.  Genitourinary: Negative.   Musculoskeletal:  Negative for joint pain and myalgias.  Skin:  Negative for rash.  Neurological:  Negative for weakness.  Endo/Heme/Allergies: Negative.   Psychiatric/Behavioral: Negative.     Objective:   Vitals:   12/16/21 1415  BP: 118/72  Pulse: 85  SpO2: 97%  Weight: 158 lb (71.7 kg)  Height: 5\' 11"  (1.803 m)   Physical Exam Constitutional:      General: He is not in acute distress.    Comments: thin  HENT:     Head: Normocephalic and atraumatic.  Eyes:     Extraocular Movements: Extraocular movements intact.     Conjunctiva/sclera: Conjunctivae normal.     Pupils: Pupils are equal, round, and reactive to light.   Cardiovascular:     Rate and Rhythm: Normal rate and regular rhythm.     Pulses: Normal pulses.     Heart sounds: Normal heart sounds. No murmur heard. Pulmonary:     Effort: Pulmonary effort is normal.     Breath sounds: Decreased air movement present. Decreased breath sounds present. No wheezing, rhonchi or rales.  Abdominal:     General: Bowel sounds are normal.     Palpations: Abdomen is soft.  Musculoskeletal:     Right lower leg: No edema.     Left lower leg: No edema.  Lymphadenopathy:     Cervical: No cervical adenopathy.  Skin:    General: Skin is warm and dry.  Neurological:     General: No focal deficit present.     Mental Status: He is alert.  Psychiatric:        Mood and Affect: Mood normal.        Behavior: Behavior normal.        Thought Content: Thought content normal.        Judgment: Judgment normal.   CBC    Component Value Date/Time   WBC 10.2 05/21/2021 0934   RBC 3.85 (L) 05/21/2021 0934   HGB 12.2 (L) 05/21/2021 0934   HGB 13.0 11/03/2020 1033   HCT 37.0 (L) 05/21/2021 0934   HCT 40.0 11/03/2020 1033   PLT 340.0 05/21/2021 0934   PLT 301 11/03/2020 1033   MCV 96.0 05/21/2021 0934   MCV 96 11/03/2020 1033   MCH 31.3 11/03/2020 1033   MCH 30.6 10/09/2018 2349   MCHC 33.1 05/21/2021 0934   RDW 13.3 05/21/2021 0934   RDW 11.7 11/03/2020 1033   LYMPHSABS 2.3 05/21/2021 0934   MONOABS 1.0 05/21/2021 0934   EOSABS 0.2 05/21/2021 0934  BASOSABS 0.1 05/21/2021 0934      Latest Ref Rng & Units 05/11/2021   12:01 PM 11/03/2020   10:33 AM 05/08/2020    9:35 AM  BMP  Glucose 70 - 99 mg/dL 108   108   105    BUN 8 - 27 mg/dL 13   17   20     Creatinine 0.76 - 1.27 mg/dL 1.21   1.22   1.42    BUN/Creat Ratio 10 - 24 11   14   14     Sodium 134 - 144 mmol/L 140   143   141    Potassium 3.5 - 5.2 mmol/L 3.6   4.1   3.8    Chloride 96 - 106 mmol/L 98   100   100    CO2 20 - 29 mmol/L 27   26   24     Calcium 8.6 - 10.2 mg/dL 9.3   9.3   9.4      Chest imaging: CT Chest 06/16/21 8 mm irregular nodular density is noted anteriorly in the right upper lobe. Non-contrast chest CT at 6-12 months is recommended. If the nodule is stable at time of repeat CT, then future CT at 18-24 months (from today's scan) is considered optional for low-risk patients, but is recommended for high-risk patients.  CT Chest 2021 75mm LLL nodule  CXR 2019 Emphysema, bullous changes  PFT:     View : No data to display.         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  He is to continue on supplemental oxygen 3L at rest and 4L with exertion. He is to continue oxygen use at night when sleeping.   He is to continue on stiolto and asmanex inhalers daily. He can continue nebulizer treatments as needed. He is to continue azithromycin 3 days per week. He can continue prednisone 10mg  daily. Continue montelukast 10mg  daily.  Follow up in 6 months.   Freda Jackson, MD [facility] Office: [phone]   Current Outpatient Medications:    albuterol (PROVENTIL) (2.5 MG/3ML) 0.083% nebulizer solution, Take 3 mLs (2.5 mg total) by nebulization every 6 (six) hours as needed for wheezing or shortness of breath., Disp: 120 mL, Rfl: 6   azithromycin (ZITHROMAX) 250 MG tablet, Take two tablets by mouth initially then one tablet daily for 4 more days. (Patient taking differently: Take 250 mg by mouth. Three times per week.), Disp: 6 tablet, Rfl: 0   bisoprolol (ZEBETA) 5 MG tablet, TAKE 1 TABLET DAILY, Disp: 90 tablet, Rfl: 3   cholecalciferol (VITAMIN D) 1000  units tablet, Take 1,000 Units by mouth daily., Disp: , Rfl:    guaiFENesin-codeine 100-10 MG/5ML syrup, TAKE 5 MLS TWICE A DAY AS NEEDED FOR COUGH, Disp: 120 mL, Rfl: 2   mometasone (ASMANEX) 220 MCG/INH inhaler, Inhale 2 puffs into the lungs at bedtime., Disp: , Rfl:    montelukast (SINGULAIR) 10 MG tablet, Take 1 tablet (10 mg total) by mouth at bedtime., Disp: 90 tablet, Rfl: 3   Multiple Vitamins-Minerals (CENTRUM SILVER ADULT 50+ PO), Take 1 tablet by mouth daily. ,  Disp: , Rfl:    omeprazole (PRILOSEC) 40 MG capsule, TAKE 1 CAPSULE DAILY, Disp: 90 capsule, Rfl: 3   predniSONE (DELTASONE) 5 MG tablet, Take 5 mg by mouth daily with breakfast., Disp: , Rfl:    rosuvastatin (CRESTOR) 20 MG tablet, TAKE 1 TABLET DAILY, Disp: 90 tablet, Rfl: 3   sildenafil (VIAGRA) 100 MG tablet, TAKE 1 TABLET AS NEEDED FOR ERECTILE DYSFUNCTION, Disp: 18 tablet, Rfl: 3   tamsulosin (FLOMAX) 0.4 MG CAPS capsule, TAKE 1 CAPSULE DAILY FOR PROSTATE, Disp: 90 capsule, Rfl: 3   Tiotropium Bromide-Olodaterol 2.5-2.5 MCG/ACT AERS, Inhale 2 puffs into the lungs daily., Disp: , Rfl:    traZODone (DESYREL) 100 MG tablet, TAKE 1 TABLET AT BEDTIME, Disp: 90 tablet, Rfl: 3   VENTOLIN HFA 108 (90 Base) MCG/ACT inhaler, USE 2 INHALATIONS EVERY 4 HOURS AS NEEDED. RESCUE FOR SHORTNESS OF BREATH (Patient taking differently: Inhale 2 puffs into the lungs every 4 (four) hours as needed for wheezing.), Disp: 54 g, Rfl: 1   apixaban (ELIQUIS) 5 MG TABS tablet, Take 1 tablet (5 mg total) by mouth 2 (two) times daily., Disp: 180 tablet, Rfl: 3

## 2021-12-19 ENCOUNTER — Other Ambulatory Visit: Payer: Self-pay | Admitting: Family Medicine

## 2021-12-19 DIAGNOSIS — J449 Chronic obstructive pulmonary disease, unspecified: Secondary | ICD-10-CM | POA: Diagnosis not present

## 2021-12-19 DIAGNOSIS — N400 Enlarged prostate without lower urinary tract symptoms: Secondary | ICD-10-CM

## 2021-12-22 ENCOUNTER — Encounter: Payer: Self-pay | Admitting: *Deleted

## 2021-12-23 ENCOUNTER — Other Ambulatory Visit: Payer: Self-pay | Admitting: Family Medicine

## 2022-01-18 DIAGNOSIS — J449 Chronic obstructive pulmonary disease, unspecified: Secondary | ICD-10-CM | POA: Diagnosis not present

## 2022-02-11 ENCOUNTER — Other Ambulatory Visit: Payer: Self-pay | Admitting: Family Medicine

## 2022-02-11 DIAGNOSIS — J439 Emphysema, unspecified: Secondary | ICD-10-CM

## 2022-02-18 DIAGNOSIS — J449 Chronic obstructive pulmonary disease, unspecified: Secondary | ICD-10-CM | POA: Diagnosis not present

## 2022-03-02 DIAGNOSIS — J449 Chronic obstructive pulmonary disease, unspecified: Secondary | ICD-10-CM | POA: Diagnosis not present

## 2022-03-21 DIAGNOSIS — J449 Chronic obstructive pulmonary disease, unspecified: Secondary | ICD-10-CM | POA: Diagnosis not present

## 2022-03-29 ENCOUNTER — Emergency Department (HOSPITAL_COMMUNITY): Payer: TRICARE For Life (TFL)

## 2022-03-29 ENCOUNTER — Emergency Department (HOSPITAL_COMMUNITY)
Admission: EM | Admit: 2022-03-29 | Discharge: 2022-03-29 | Disposition: A | Discharge status: Home / Self Care | Payer: Medicare PPO | Attending: Emergency Medicine | Admitting: Emergency Medicine

## 2022-03-29 ENCOUNTER — Other Ambulatory Visit: Payer: Self-pay

## 2022-03-29 ENCOUNTER — Encounter (HOSPITAL_COMMUNITY): Payer: Self-pay

## 2022-03-29 DIAGNOSIS — R079 Chest pain, unspecified: Secondary | ICD-10-CM | POA: Diagnosis not present

## 2022-03-29 DIAGNOSIS — R0902 Hypoxemia: Secondary | ICD-10-CM | POA: Diagnosis not present

## 2022-03-29 DIAGNOSIS — I4891 Unspecified atrial fibrillation: Secondary | ICD-10-CM | POA: Diagnosis not present

## 2022-03-29 DIAGNOSIS — M79601 Pain in right arm: Secondary | ICD-10-CM | POA: Diagnosis not present

## 2022-03-29 DIAGNOSIS — R000 Tachycardia, unspecified: Secondary | ICD-10-CM | POA: Diagnosis not present

## 2022-03-29 DIAGNOSIS — I10 Essential (primary) hypertension: Secondary | ICD-10-CM | POA: Diagnosis not present

## 2022-03-29 DIAGNOSIS — R0789 Other chest pain: Secondary | ICD-10-CM | POA: Diagnosis not present

## 2022-03-29 DIAGNOSIS — Z7901 Long term (current) use of anticoagulants: Secondary | ICD-10-CM | POA: Diagnosis not present

## 2022-03-29 LAB — CBC WITH DIFFERENTIAL/PLATELET
Abs Immature Granulocytes: 0.08 10*3/uL — ABNORMAL HIGH (ref 0.00–0.07)
Basophils Absolute: 0 10*3/uL (ref 0.0–0.1)
Basophils Relative: 0 %
Eosinophils Absolute: 0.1 10*3/uL (ref 0.0–0.5)
Eosinophils Relative: 1 %
HCT: 35.2 % — ABNORMAL LOW (ref 39.0–52.0)
Hemoglobin: 10.1 g/dL — ABNORMAL LOW (ref 13.0–17.0)
Immature Granulocytes: 1 %
Lymphocytes Relative: 12 %
Lymphs Abs: 1.6 10*3/uL (ref 0.7–4.0)
MCH: 25 pg — ABNORMAL LOW (ref 26.0–34.0)
MCHC: 28.7 g/dL — ABNORMAL LOW (ref 30.0–36.0)
MCV: 87.1 fL (ref 80.0–100.0)
Monocytes Absolute: 1.2 10*3/uL — ABNORMAL HIGH (ref 0.1–1.0)
Monocytes Relative: 9 %
Neutro Abs: 10 10*3/uL — ABNORMAL HIGH (ref 1.7–7.7)
Neutrophils Relative %: 77 %
Platelets: 373 10*3/uL (ref 150–400)
RBC: 4.04 MIL/uL — ABNORMAL LOW (ref 4.22–5.81)
RDW: 15.9 % — ABNORMAL HIGH (ref 11.5–15.5)
WBC: 13 10*3/uL — ABNORMAL HIGH (ref 4.0–10.5)
nRBC: 0 % (ref 0.0–0.2)

## 2022-03-29 LAB — COMPREHENSIVE METABOLIC PANEL
ALT: 21 U/L (ref 0–44)
AST: 20 U/L (ref 15–41)
Albumin: 3.6 g/dL (ref 3.5–5.0)
Alkaline Phosphatase: 41 U/L (ref 38–126)
Anion gap: 8 (ref 5–15)
BUN: 16 mg/dL (ref 8–23)
CO2: 26 mmol/L (ref 22–32)
Calcium: 9.4 mg/dL (ref 8.9–10.3)
Chloride: 106 mmol/L (ref 98–111)
Creatinine, Ser: 1.18 mg/dL (ref 0.61–1.24)
GFR, Estimated: >60 mL/min (ref >60)
Glucose, Bld: 133 mg/dL — ABNORMAL HIGH (ref 70–99)
Potassium: 3.8 mmol/L (ref 3.5–5.1)
Sodium: 140 mmol/L (ref 135–145)
Total Bilirubin: 0.7 mg/dL (ref 0.3–1.2)
Total Protein: 6.4 g/dL — ABNORMAL LOW (ref 6.5–8.1)

## 2022-03-29 LAB — LIPASE, BLOOD: Lipase: 30 U/L (ref 11–51)

## 2022-03-29 LAB — BRAIN NATRIURETIC PEPTIDE: B Natriuretic Peptide: 120.9 pg/mL — ABNORMAL HIGH (ref 0.0–100.0)

## 2022-03-29 LAB — TROPONIN I (HIGH SENSITIVITY)
Troponin I (High Sensitivity): 12 ng/L (ref <18)
Troponin I (High Sensitivity): 11 ng/L (ref <18)

## 2022-03-29 MED ORDER — METOPROLOL TARTRATE 5 MG/5ML IV SOLN
5.0000 mg | Freq: Once | INTRAVENOUS | Status: AC
Start: 2022-03-29 — End: 2022-03-29
  Administered 2022-03-29: 5 mg via INTRAVENOUS
  Filled 2022-03-29: qty 5

## 2022-03-29 MED ORDER — LACTATED RINGERS IV BOLUS
1000.0000 mL | Freq: Once | INTRAVENOUS | Status: AC
  Administered 2022-03-29: 1000 mL via INTRAVENOUS

## 2022-03-29 NOTE — ED Provider Notes (Signed)
[facility] EMERGENCY DEPARTMENT Provider Note   CSN: [id number] Arrival date & time: 03/29/22  0342     History  Chief Complaint  Patient presents with   Chest Pain    Michael Robertson. is a 76 y.o. male.  76 year old male that presents the ER today with chest pain.  Patient states that he was woken from sleep with some right arm pain that he cannot really describe very well.  States it was not sharp not necessarily pressure just was uncomfortable.  He then started having some chest discomfort that was similar quality.  He put on his pulse ox and his heart rate was in A-fib and his oxygen saturations 98%.  He tried taking his home atrial fibrillation medicine something instructed to be that he is supposed to take 1 extra if in A-fib but this did not help so he called EMS.  Patient states that this time he has no discomfort at all.  No shortness of breath.  No lower extremity swelling.  He feels at baseline. Does not notice palpitations. Goes to the [place]. On eliquis. Compliant with all meds. No recent illnesses.    Chest Pain      Home Medications Prior to Admission medications   Medication Sig Start Date End Date Taking? Authorizing Provider  albuterol (PROVENTIL) (2.5 MG/3ML) 0.083% nebulizer solution Take 3 mLs (2.5 mg total) by nebulization every 6 (six) hours as needed for wheezing or shortness of breath. 05/28/21   Martina Sinner, MD  apixaban (ELIQUIS) 5 MG TABS tablet Take 1 tablet (5 mg total) by mouth 2 (two) times daily. 10/30/18 10/25/19  Moses Manners, MD  azithromycin (ZITHROMAX) 250 MG tablet Take two tablets by mouth initially then one tablet daily for 4 more days. Patient taking differently: Take 250 mg by mouth. Three times per week. 01/10/20   McDiarmid, Leighton Roach, MD  bisoprolol (ZEBETA) 5 MG tablet TAKE 1 TABLET DAILY 09/24/19   Moses Manners, MD  cholecalciferol (VITAMIN D) 1000 units tablet Take 1,000 Units by mouth daily.    [provider]  guaiFENesin-codeine 100-10 MG/5ML syrup TAKE 5 ML TWICE A DAY AS NEEDED FOR COUGH 02/11/22   Moses Manners, MD  mometasone [facility]) 220 MCG/INH inhaler Inhale 2 puffs into the lungs at bedtime.    [provider]  montelukast (SINGULAIR) 10 MG tablet Take 1 tablet (10 mg total) by mouth at bedtime. 05/25/21   Martina Sinner, MD  Multiple Vitamins-Minerals (CENTRUM SILVER ADULT 50+ PO) Take 1 tablet by mouth daily.     [provider]  omeprazole (PRILOSEC) 40 MG capsule TAKE 1 CAPSULE DAILY 07/31/21   Hensel, Santiago Bumpers, MD  predniSONE (DELTASONE) 5 MG tablet Take 5 mg by mouth daily with breakfast.    [provider]  rosuvastatin (CRESTOR) 20 MG tablet TAKE 1 TABLET DAILY 09/28/21   Moses Manners, MD  sildenafil (VIAGRA) 100 MG tablet TAKE 1 TABLET AS NEEDED FOR ERECTILE DYSFUNCTION 12/20/19   Moses Manners, MD  tamsulosin (FLOMAX) 0.4 MG CAPS capsule TAKE 1 CAPSULE DAILY FOR PROSTATE 12/21/21   Moses Manners, MD  Tiotropium Bromide-Olodaterol 2.5-2.5 MCG/ACT AERS Inhale 2 puffs into the lungs daily.    [provider]  traZODone (DESYREL) 100 MG tablet TAKE 1 TABLET AT BEDTIME 10/26/21   Hensel, Santiago Bumpers, MD  VENTOLIN HFA 108 (90 Base) MCG/ACT inhaler USE 2 INHALATIONS EVERY 4 HOURS AS NEEDED. RESCUE FOR SHORTNESS  OF BREATH Patient taking differently: Inhale 2 puffs into the lungs every 4 (four) hours as needed for wheezing. 08/19/15   Nestor Ramp, MD      Allergies    Doxycycline    Review of Systems   Review of Systems  Cardiovascular:  Positive for chest pain.    Physical Exam Updated Vital Signs BP 133/65 (BP Location: Right Arm)   Pulse 70   Temp 98.4 F (36.9 C) (Oral)   Resp 14   Ht 5\' 11"  (1.803 m)   Wt 71.7 kg   SpO2 95%   BMI 22.04 kg/m  Physical Exam Vitals and nursing note reviewed.  Constitutional:      Appearance: He is well-developed.  HENT:     Head: Normocephalic and atraumatic.   Cardiovascular:     Rate and Rhythm: Tachycardia present. Rhythm irregular.  Pulmonary:     Effort: Pulmonary effort is normal. No respiratory distress.  Abdominal:     General: There is no distension.  Musculoskeletal:        General: Normal range of motion.     Cervical back: Normal range of motion.     Right lower leg: No edema.     Left lower leg: No edema.  Neurological:     Mental Status: He is alert.     ED Results / Procedures / Treatments   Labs (all labs ordered are listed, but only abnormal results are displayed) Labs Reviewed  CBC WITH DIFFERENTIAL/PLATELET - Abnormal; Notable for the following components:      Result Value   WBC 13.0 (*)    RBC 4.04 (*)    Hemoglobin 10.1 (*)    HCT 35.2 (*)    MCH 25.0 (*)    MCHC 28.7 (*)    RDW 15.9 (*)    Neutro Abs 10.0 (*)    Monocytes Absolute 1.2 (*)    Abs Immature Granulocytes 0.08 (*)    All other components within normal limits  COMPREHENSIVE METABOLIC PANEL - Abnormal; Notable for the following components:   Glucose, Bld 133 (*)    Total Protein 6.4 (*)    All other components within normal limits  BRAIN NATRIURETIC PEPTIDE - Abnormal; Notable for the following components:   B Natriuretic Peptide 120.9 (*)    All other components within normal limits  LIPASE, BLOOD  TROPONIN I (HIGH SENSITIVITY)  TROPONIN I (HIGH SENSITIVITY)    EKG None  Radiology DG Chest 2 View  Result Date: 03/29/2022 CLINICAL DATA:  Chest pain EXAM: CHEST - 2 VIEW COMPARISON:  10/16/2010 FINDINGS: Lungs are markedly hyperexpanded. The lungs are clear without focal pneumonia, edema, pneumothorax or pleural effusion. Streaky linear opacities in the left mid lung and left lung base likely reflect chronic atelectasis or scarring. Calcified granuloma noted medial left upper lobe. The cardiopericardial silhouette is within normal limits for size. The visualized bony structures of the thorax are unremarkable. Telemetry leads overlie the  chest. IMPRESSION: Hyperexpansion consistent with emphysema. No acute cardiopulmonary findings. Electronically Signed   By: 10/18/2010 M.D.   On: 03/29/2022 05:00    Procedures Procedures   Medications Ordered in ED Medications  metoprolol tartrate (LOPRESSOR) injection 5 mg (5 mg Intravenous Given by Other 03/29/22 0447)  lactated ringers bolus 1,000 mL (0 mLs Intravenous Stopped 03/29/22 0705)    ED Course/ Medical Decision Making/ A&P  Medical Decision Making Amount and/or Complexity of Data Reviewed Labs: ordered. Radiology: ordered. ECG/medicine tests: ordered.  Risk Prescription drug management.  Offered sedation/cardioversion but patient was very hesitant and prefers medical treatment if possible. Will start with metoprolol as I think his home med is a beta blocker. Patient states he doesn't usually have chest or arm discomfort with his Afib so will rule out ACS as well.  Beta blocker put him back into sinus rhythm. No tachycardia. If troponins are reassuring, can likely be discharged.  Trops good. Pain free. Doubt ACS. Will see cardiologist on Friday. Here if worsening.   Final Clinical Impression(s) / ED Diagnoses Final diagnoses:  Nonspecific chest pain  Atrial fibrillation with RVR [facility])    Rx / DC Orders ED Discharge Orders     None         Shneur [place], Barbara Cower, MD 03/29/22 [phone]

## 2022-03-29 NOTE — ED Triage Notes (Signed)
EMS called to residence due to chest pain.  Upon arrival chest pain resolved, but patient was in intermitent afib with RVR.

## 2022-03-31 ENCOUNTER — Telehealth: Payer: Self-pay

## 2022-03-31 DIAGNOSIS — J449 Chronic obstructive pulmonary disease, unspecified: Secondary | ICD-10-CM | POA: Diagnosis not present

## 2022-03-31 NOTE — Patient Outreach (Signed)
  Care Coordination TOC Note Transition Care Management Follow-up Telephone Call Date of discharge and from where: [facility] 03/29/22 How have you been since you were released from the hospital? "Last night I felt my rhythm changed to afib, then it went back to normal". Any questions or concerns? Yes- Afib; Patient has appointment with the [facility] Cardiologist on Friday, in the mean time he will call [facility] to get in to see PCP.  Items Reviewed: Did the pt receive and understand the discharge instructions provided? Yes  Medications obtained and verified? Yes  Other? No  Any new allergies since your discharge? No  Dietary orders reviewed? Yes Do you have support at home? Yes   Home Care and Equipment/Supplies: Were home health services ordered? no If so, what is the name of the agency? N/A  Has the agency set up a time to come to the patient's home? not applicable Were any new equipment or medical supplies ordered?  No What is the name of the medical supply agency? N/A Were you able to get the supplies/equipment? not applicable Do you have any questions related to the use of the equipment or supplies? No  Functional Questionnaire: (I = Independent and D = Dependent) ADLs: I  Bathing/Dressing- I  Meal Prep- I  Eating- I  Maintaining continence- I  Transferring/Ambulation- I  Managing Meds- I  Follow up appointments reviewed:  PCP Hospital f/u appt confirmed? No   Specialist Hospital f/u appt confirmed? Yes  [facility]. Are transportation arrangements needed? No  If their condition worsens, is the pt aware to call PCP or go to the Emergency Dept.? Yes Was the patient provided with contact information for the PCP's office or ED? Yes Was to pt encouraged to call back with questions or concerns? Yes  SDOH assessments and interventions completed:   Yes  Care Coordination Interventions Activated:  Yes   Care Coordination Interventions:  PCP follow up  appointment requested    Encounter Outcome:  Pt. Visit Completed

## 2022-04-15 ENCOUNTER — Encounter: Payer: Self-pay | Admitting: Family Medicine

## 2022-04-15 ENCOUNTER — Ambulatory Visit (INDEPENDENT_AMBULATORY_CARE_PROVIDER_SITE_OTHER): Payer: Medicare PPO | Admitting: Family Medicine

## 2022-04-15 VITALS — BP 158/72 | HR 74 | Wt 157.2 lb

## 2022-04-15 DIAGNOSIS — I10 Essential (primary) hypertension: Secondary | ICD-10-CM

## 2022-04-15 DIAGNOSIS — J441 Chronic obstructive pulmonary disease with (acute) exacerbation: Secondary | ICD-10-CM | POA: Diagnosis not present

## 2022-04-15 DIAGNOSIS — Z23 Encounter for immunization: Secondary | ICD-10-CM | POA: Diagnosis not present

## 2022-04-15 DIAGNOSIS — N401 Enlarged prostate with lower urinary tract symptoms: Secondary | ICD-10-CM

## 2022-04-15 DIAGNOSIS — R3912 Poor urinary stream: Secondary | ICD-10-CM | POA: Diagnosis not present

## 2022-04-15 DIAGNOSIS — J439 Emphysema, unspecified: Secondary | ICD-10-CM | POA: Diagnosis not present

## 2022-04-15 DIAGNOSIS — N400 Enlarged prostate without lower urinary tract symptoms: Secondary | ICD-10-CM | POA: Diagnosis not present

## 2022-04-15 MED ORDER — HYDROCHLOROTHIAZIDE 25 MG PO TABS: 25.0000 mg | ORAL_TABLET | Freq: Every day | ORAL | 3 refills | Status: DC

## 2022-04-15 MED ORDER — TAMSULOSIN HCL 0.4 MG PO CAPS: 0.8000 mg | ORAL_CAPSULE | Freq: Every day | ORAL | 3 refills | Status: DC

## 2022-04-15 MED ORDER — PREDNISONE 50 MG PO TABS: ORAL_TABLET | ORAL | 5 refills | Status: DC

## 2022-04-15 MED ORDER — AZITHROMYCIN 500 MG PO TABS: ORAL_TABLET | ORAL | 5 refills | Status: DC

## 2022-04-15 NOTE — Assessment & Plan Note (Signed)
Blood pressure is not at goal: 158/72. Continue Bisoprolol 5mg  daily. Start HCTZ 25mg  daily. Continue to measure home BPs.

## 2022-04-15 NOTE — Progress Notes (Signed)
  Date of Visit: 04/15/2022   HPI:  Michael Robertson. Is a 76 y.o. here for f/u:  COPD Patient is doing with current regimen of home oxygen, Mometasone, Singulair, and Tiotropium-Olodaterol. He takes Prednisone 5mg  qD and Azithromycin 250mg  TID. He has Prednisone 50mg  and Azithromycin 500mg  as needed for COPD exacerbations.   HTN His home blood pressures range from 160s-170s/80s-90s. He takes Bisoprolol 5mg  daily. He denies chest pain, headaches, or dizziness.  BPH Patient takes Tamsulosin 0.4mg  daily. He reports bladder fullness after voids.  PHYSICAL EXAM: BP (!) 158/72   Pulse 74   Wt 157 lb 3.2 oz (71.3 kg)   SpO2 95%   BMI 21.92 kg/m  Gen: Well appearing in no distress CV: Normal rate. Normal S1 and S2. No murmurs. Pulm: Normal work of breathing with nasal cannula. No wheezing or crackles. Neuro: Alert and oriented.  ASSESSMENT/PLAN:  HYPERTENSION, BENIGN SYSTEMIC Blood pressure is not at goal: 158/72. Continue Bisoprolol 5mg  daily. Start HCTZ 25mg  daily. Continue to measure home BPs.   Benign prostatic hyperplasia with weak urinary stream Patient reports continued symptoms with current dose. Increase Tamsulosin to 0.4mg  BID. Continue to monitor for symptom resolution. Consider Finasteride if symptoms are unresolved.  COPD (chronic obstructive pulmonary disease) with emphysema (HCC) Well controlled. Continue current regimen. Refilled Prednisone 50mg  and Azithromycin 500mg  for COPD exacerbations.  Follow-up Return in 2 months for blood pressure follow-up.  Leonette Nutting, [place]

## 2022-04-15 NOTE — Progress Notes (Signed)
HBP not at goal.  He had previously been well controled on HCTZ.  Not sure why I stopped, but I will restart.  COPD under good control.  Chronic steroids.  Chronic O2.  Patient smart and knows how to handle exacerbations.  Given meds to cover.Flu shot today.  COVID booster when available.  Given severity of COPD, may be a good candidate for RSV vaccine.  Wife died last 09-04-2021.  Lives with stepson. Doing fine.  Not depressed.  Still enjoys life.    BPH with obstructive sx.  Will up dose of tamsulosen.

## 2022-04-15 NOTE — Assessment & Plan Note (Addendum)
Patient reports continued symptoms with current dose. Increase Tamsulosin to 0.4mg  BID. Continue to monitor for symptom resolution. Consider Finasteride if symptoms are unresolved.

## 2022-04-15 NOTE — Assessment & Plan Note (Addendum)
Well controlled. Continue current regimen. Refilled Prednisone 50mg  and Azithromycin 500mg  for COPD exacerbations.

## 2022-04-15 NOTE — Patient Instructions (Signed)
See me in Nov/Dec to make sure your blood pressure is OK in restarting the HCTZ. Also tell me if the higher dose of tamsulosin is working.  I have another medicine I can add.  Hopefully in November, I can give you the COVID booster.   We will talk in November about the new RSV vaccine.

## 2022-04-20 DIAGNOSIS — J449 Chronic obstructive pulmonary disease, unspecified: Secondary | ICD-10-CM | POA: Diagnosis not present

## 2022-04-26 ENCOUNTER — Other Ambulatory Visit: Payer: Self-pay | Admitting: Pulmonary Disease

## 2022-04-26 DIAGNOSIS — J449 Chronic obstructive pulmonary disease, unspecified: Secondary | ICD-10-CM

## 2022-05-21 DIAGNOSIS — J449 Chronic obstructive pulmonary disease, unspecified: Secondary | ICD-10-CM | POA: Diagnosis not present

## 2022-05-28 DIAGNOSIS — J449 Chronic obstructive pulmonary disease, unspecified: Secondary | ICD-10-CM | POA: Diagnosis not present

## 2022-06-03 ENCOUNTER — Other Ambulatory Visit: Payer: Self-pay | Admitting: Family Medicine

## 2022-06-03 ENCOUNTER — Ambulatory Visit (INDEPENDENT_AMBULATORY_CARE_PROVIDER_SITE_OTHER): Payer: Medicare PPO | Admitting: Family Medicine

## 2022-06-03 ENCOUNTER — Encounter: Payer: Self-pay | Admitting: Family Medicine

## 2022-06-03 VITALS — BP 130/70 | HR 72 | Ht 71.0 in | Wt 155.8 lb

## 2022-06-03 DIAGNOSIS — N401 Enlarged prostate with lower urinary tract symptoms: Secondary | ICD-10-CM

## 2022-06-03 DIAGNOSIS — R3912 Poor urinary stream: Secondary | ICD-10-CM

## 2022-06-03 DIAGNOSIS — K219 Gastro-esophageal reflux disease without esophagitis: Secondary | ICD-10-CM

## 2022-06-03 DIAGNOSIS — J439 Emphysema, unspecified: Secondary | ICD-10-CM

## 2022-06-03 DIAGNOSIS — N1831 Chronic kidney disease, stage 3a: Secondary | ICD-10-CM | POA: Diagnosis not present

## 2022-06-03 DIAGNOSIS — I10 Essential (primary) hypertension: Secondary | ICD-10-CM

## 2022-06-03 DIAGNOSIS — J441 Chronic obstructive pulmonary disease with (acute) exacerbation: Secondary | ICD-10-CM

## 2022-06-03 DIAGNOSIS — E1122 Type 2 diabetes mellitus with diabetic chronic kidney disease: Secondary | ICD-10-CM

## 2022-06-03 LAB — POCT GLYCOSYLATED HEMOGLOBIN (HGB A1C): HbA1c, POC (controlled diabetic range): 6.6 % (ref 0.0–7.0)

## 2022-06-03 MED ORDER — AZITHROMYCIN 500 MG PO TABS: ORAL_TABLET | ORAL | 5 refills | Status: DC

## 2022-06-03 MED ORDER — PREDNISONE 50 MG PO TABS: ORAL_TABLET | ORAL | 5 refills | Status: DC

## 2022-06-03 NOTE — Progress Notes (Signed)
    SUBJECTIVE:   CHIEF COMPLAINT / HPI:   Michael Robertson. is a 76 y.o. male who presents today for follow-up. No major concerns today.  Hypertension Restarted on HCTZ at 03/2022 OV for BP. Taking BP measurement at home. Reports readings as low as 119 as high as 142. Generally in the 120s-130s on top. Diastolics in the mid-60s to mid-70s. Sometimes feels dehydrated, not drinking a lot. No presyncope.   COPD COPD flareup mid-end of Sep after swallowing something wrong, but has been doing well since with no subsequent flare-ups. Stable on medications and home O2. Reports O2 machine not providing enough O2 when walking today; no problems before. Believes this could be due to poor lung function at baseline rather than equipment problem. Following with pulm. Got flu shot at 03/2022 OV. Interested in COVID booster and RSV vaccine.  Needs renewed prescription for prednisone and azithromycin prn for exacerbations.   BPH BPH controlled on tamsulosin; dose increased at 03/2022 OV. Taking one in morning one at night. Reports increased flow since and improved symptoms. Sometimes getting up around 4am to pee, not more than once a night.   DM DM under good control today with HA1c 6.6%. Reports low sugar intake. Can consider SGLT2 for pt. Has not seen an ophthalmologist recently.  Wife died in 09/09/21; currently living with stepson. 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  PERTINENT  PMH / PSH: COPD, HTN, BPH, HLD, DM2, CKD, PAF  OBJECTIVE:   BP 130/70 (BP Location: Right Arm, Cuff Size: Normal)   Pulse 72   Ht 5\' 11"  (1.803 m)   Wt 155 lb 12.8 oz (70.7 kg)   SpO2 98%   BMI 21.73 kg/m   General: Pt well appearing and seated in chair; using O2 through [place]. NAD. Cardiovascular: RRR, no murmurs, rubs, gallops. Pulmonary: Normal work of breathing with supplemental O2 through [place]. Lungs clear to auscultation bilaterally; no crackles, wheezing, rhonchi. Neuro/Psych: A&Ox3. Pleasant with normal affect  Ha1c (06/03/2022):  6.6  ASSESSMENT/PLAN:   HYPERTENSION, BENIGN SYSTEMIC Pt's BP is improved since restarting HCTZ; home readings in 120s-130s/60s-70s. - Continue current medication  COPD (chronic obstructive pulmonary disease) with emphysema (HCC) Pt reports COPD is stable and managed on home O2 and current medication regiment. - Continue current medications - Prn prednisone and azithromycin for flares refilled at pt's request - Recommend pt receive COVID booster and RSV vaccine given poor lung function - Continue to follow with pulm  Benign prostatic hyperplasia with weak urinary stream Pt reports improvement in flow since increasing tamsulosin dose. - Continue current medication  DM (diabetes mellitus), type 2 with renal complications (HCC) Ha1c under good control today at 6.6%. Pt on diet control. - Will order urine microalbumin/Cr ratio; can consider starting SGLT2 pending results and pt's life expectancy - Encouraged pt to see an opthalmologist     06/05/2022, Medical Student [facility]

## 2022-06-03 NOTE — Assessment & Plan Note (Signed)
Pt's BP is improved since restarting HCTZ; home readings in 120s-130s/60s-70s. - Continue current medication

## 2022-06-03 NOTE — Patient Instructions (Addendum)
I sent in refills of both the meds for flare ups to [facility]. Please get both the COVID booster and RSV at the pharmacy.  Have them call me if questions.  Let us know so we can update your records.   Come back for a lab visit to give a urine specimen.  I will call after I see that result and talk about adding a medication.   If things go well for you, this is our last visit.  I retire in February.  It has been a pleasure.

## 2022-06-03 NOTE — Assessment & Plan Note (Signed)
Pt reports improvement in flow since increasing tamsulosin dose. - Continue current medication

## 2022-06-03 NOTE — Progress Notes (Signed)
Michael Robertson is doing quite well.  We touched on several problems. COPD stable.  Will get covid booster and RSV vaccines.  Already has flu shot.  I trust him to use good judgment with COPD exacerbations and trust him to have pred and azithro on hand to treat exacerbations.  This strategy has worked well.  He needed refills.   HBP  Stable on current meds.  Will check microalbumin this visit.   BPH  Better urine flow on higher dose of flomax. This is likely my last face to face visit with Michael Robertson.  He has been a wonderful patient.  It has been a privilege to be his physician.

## 2022-06-03 NOTE — Assessment & Plan Note (Signed)
Ha1c under good control today at 6.6%. Pt on diet control. - Will order urine microalbumin/Cr ratio; can consider starting SGLT2 pending results and pt's life expectancy - Encouraged pt to see an opthalmologist

## 2022-06-03 NOTE — Assessment & Plan Note (Addendum)
Pt reports COPD is stable and managed on home O2 and current medication regiment. - Continue current medications - Prn prednisone and azithromycin for flares refilled at pt's request - Recommend pt receive COVID booster and RSV vaccine given poor lung function - Continue to follow with pulm

## 2022-06-20 DIAGNOSIS — J449 Chronic obstructive pulmonary disease, unspecified: Secondary | ICD-10-CM | POA: Diagnosis not present

## 2022-07-05 DIAGNOSIS — H401131 Primary open-angle glaucoma, bilateral, mild stage: Secondary | ICD-10-CM | POA: Diagnosis not present

## 2022-07-20 ENCOUNTER — Encounter: Payer: Self-pay | Admitting: Pulmonary Disease

## 2022-07-20 ENCOUNTER — Ambulatory Visit (INDEPENDENT_AMBULATORY_CARE_PROVIDER_SITE_OTHER): Payer: Medicare PPO | Admitting: Pulmonary Disease

## 2022-07-20 VITALS — BP 128/70 | HR 68 | Ht 71.0 in | Wt 154.0 lb

## 2022-07-20 DIAGNOSIS — J9611 Chronic respiratory failure with hypoxia: Secondary | ICD-10-CM

## 2022-07-20 DIAGNOSIS — J449 Chronic obstructive pulmonary disease, unspecified: Secondary | ICD-10-CM

## 2022-07-20 NOTE — Patient Instructions (Addendum)
Continue to use asmanex inhaler 2 puffs at bedtime   Continue stiolto inhaler 2 puffs daily   Start montelukast 10mg  daily for sinus congestion and your breathing   Continue azithromycin 3 days per week   Continue albuterol inhaler as needed..   Consider doing pulmonary rehab again   Follow up in 6 months

## 2022-07-20 NOTE — Progress Notes (Signed)
Synopsis: Referred in November 2022 for COPD/Resp failure, previously followed at [facility] clinic  Subjective:   PATIENT ID: Michael Robertson. GENDER: male DOB: December 23, 1945, MRN: [id number]  HPI  Chief Complaint  Patient presents with   Follow-up    6 mo f/u for COPD. States his breathing has been stable since last visit.    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   His breathing has been stable since last visit. He continues on the same regimen as before. He continues to perform all ADLs independently.   OV 12/16/21 He is using 3-4L of O2. He reports his wife passed from liver cancer in September 16, 2022 and he had let himself go for a bit where he had a harder time breathing. He was treated with levaquin which helped his breathing.   His step son has moved in with him.   He continues on asmanex nightly and stiolto daily along with prednisone 10mg  daily and azithromycin 3 days per week.   He reports his breathing is roughly at baseline but overall very short of breath.  OV 05/25/21 He is currently on ICS/LAMA/LABA inhaler therapy, 10mg  prednisone daily, azithromycin 3 days per week and supplemental oxygen 2L at rest and 3L with exertion.  He is more short of breath over the last six months due to a covid infection in June and then another viral infection last month. He notices wheezing at night. He also has increased mucous production over the past couple of months.   Hot humid weather and allergies in the beginning of summer bother his breathing.   He is using flonase for sinus congestion and post nasal drainage as needed with good efficacy.    He has lost 10lbs in the last few months. He is more active due to helping his wife around the home more as she is dealing with a liver cancer diagnosis.   He has serial CT Chest scans to follow a pulmonary nodule which has been  stable over the past year.   Past Medical History:  Diagnosis Date   Chronic kidney disease    COPD (chronic obstructive pulmonary disease) (HCC)    GERD (gastroesophageal reflux disease)    Hyperlipidemia    Hypertension    Insomnia    Oxygen deficiency    Substance abuse ([place]) 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     Family History  Problem Relation Age of Onset   Stroke Mother    Dementia Mother    Diabetes Mother    Cancer Father        colon   Stroke Father    Alzheimer's disease Sister    Diabetes Sister    Dementia Sister    Obesity Daughter    Diabetes Daughter    Hypertension Daughter    Stroke Son      Social History   Socioeconomic History   Marital status: Widowed    Spouse name: Rosaria Ferries- passed away 09-16-2021   Number of children: 2   Years of education: 46- some college   Highest education level: 12th grade  Occupational History   Occupation: Retired- Actor: RETIRED   Occupation: RetiredAmbulance person   Tobacco Use   Smoking status: Former    Packs/day: 1.75    Years: 40.00    Total pack years: 70.00    Types: Cigarettes    Quit  date: 03/01/2004    Years since quitting: 18.3    Passive exposure: Past   Smokeless tobacco: Never  Vaping Use   Vaping Use: Never used  Substance and Sexual Activity   Alcohol use: No   Drug use: No    Comment: Hx of use   Sexual activity: Not Currently  Other Topics Concern   Not on file  Social History Narrative   Patient lives with his son.    His wife recent passed away in 2021/09/08.   Patient does drive occasionally. His son does must of his shopping.    Patient is on home oxygen. Patient uses a scooter when out of his home.    Social Determinants of Health   Financial Resource Strain: Low Risk  (09/30/2021)   Overall Financial Resource Strain (CARDIA)    Difficulty of Paying Living Expenses: Not hard at all  Food Insecurity: No Food Insecurity (09/30/2021)   Hunger Vital Sign    Worried About Running Out of Food in  the Last Year: Never true    Ran Out of Food in the Last Year: Never true  Transportation Needs: No Transportation Needs (03/31/2022)   PRAPARE - Hydrologist (Medical): No    Lack of Transportation (Non-Medical): No  Physical Activity: Inactive (09/30/2021)   Exercise Vital Sign    Days of Exercise per Week: 0 days    Minutes of Exercise per Session: 0 min  Stress: Stress Concern Present (09/30/2021)   [place]    Feeling of Stress : To some extent  Social Connections: Socially Isolated (09/30/2021)   Social Connection and Isolation Panel [NHANES]    Frequency of Communication with Friends and Family: More than three times a week    Frequency of Social Gatherings with Friends and Family: More than three times a week    Attends Religious Services: Never    Marine scientist or Organizations: No    Attends Archivist Meetings: Never    Marital Status: Widowed  Intimate Partner Violence: Not At Risk (09/30/2021)   Humiliation, Afraid, Rape, and Kick questionnaire    Fear of Current or Ex-Partner: No    Emotionally Abused: No    Physically Abused: No    Sexually Abused: No     Allergies  Allergen Reactions   Doxycycline     REACTION: vomiting     Outpatient Medications Prior to Visit  Medication Sig Dispense Refill   albuterol (PROVENTIL) (2.5 MG/3ML) 0.083% nebulizer solution Take 3 mLs (2.5 mg total) by nebulization every 6 (six) hours as needed for wheezing or shortness of breath. 120 mL 6   azithromycin (ZITHROMAX) 250 MG tablet Take two tablets by mouth initially then one tablet daily for 4 more days. (Patient taking differently: Take 250 mg by mouth. Three times per week.) 6 tablet 0   azithromycin (ZITHROMAX) 500 MG tablet One pill daily for three days as needed for a COPD exacerbation 3 tablet 5   bisoprolol (ZEBETA) 5 MG tablet TAKE 1 TABLET DAILY 90 tablet 3    cholecalciferol (VITAMIN D) 1000 units tablet Take 1,000 Units by mouth daily.     guaiFENesin-codeine 100-10 MG/5ML syrup TAKE 5 ML TWICE A DAY AS NEEDED FOR COUGH 120 mL 2   hydrochlorothiazide (HYDRODIURIL) 25 MG tablet Take 1 tablet (25 mg total) by mouth daily. 90 tablet 3   mometasone (ASMANEX) 220 MCG/INH inhaler Inhale 2  puffs into the lungs at bedtime.     montelukast (SINGULAIR) 10 MG tablet TAKE 1 TABLET AT BEDTIME 90 tablet 1   Multiple Vitamins-Minerals (CENTRUM SILVER ADULT 50+ PO) Take 1 tablet by mouth daily.      omeprazole (PRILOSEC) 40 MG capsule TAKE 1 CAPSULE DAILY 90 capsule 3   predniSONE (DELTASONE) 5 MG tablet Take 5 mg by mouth daily with breakfast.     predniSONE (DELTASONE) 50 MG tablet One pill daily for five days as needed for a COPD exacerbation 5 tablet 5   rosuvastatin (CRESTOR) 20 MG tablet TAKE 1 TABLET DAILY 90 tablet 3   tamsulosin (FLOMAX) 0.4 MG CAPS capsule Take 2 capsules (0.8 mg total) by mouth daily after supper. (Patient taking differently: Take 0.8 mg by mouth 2 (two) times daily.) 180 capsule 3   Tiotropium Bromide-Olodaterol 2.5-2.5 MCG/ACT AERS Inhale 2 puffs into the lungs daily.     traZODone (DESYREL) 100 MG tablet TAKE 1 TABLET AT BEDTIME 90 tablet 3   VENTOLIN HFA 108 (90 Base) MCG/ACT inhaler USE 2 INHALATIONS EVERY 4 HOURS AS NEEDED. RESCUE FOR SHORTNESS OF BREATH 54 g 1   apixaban (ELIQUIS) 5 MG TABS tablet Take 1 tablet (5 mg total) by mouth 2 (two) times daily. 180 tablet 3   No facility-administered medications prior to visit.    Review of Systems  Constitutional:  Negative for chills, fever, malaise/fatigue and weight loss.  HENT:  Negative for congestion, sinus pain and sore throat.   Eyes: Negative.   Respiratory:  Positive for shortness of breath. Negative for cough, hemoptysis, sputum production and wheezing.   Cardiovascular:  Negative for chest pain, palpitations, orthopnea, claudication and leg swelling.  Gastrointestinal:   Negative for abdominal pain, heartburn, nausea and vomiting.  Genitourinary: Negative.   Musculoskeletal:  Negative for joint pain and myalgias.  Skin:  Negative for rash.  Neurological:  Negative for weakness.  Endo/Heme/Allergies: Negative.   Psychiatric/Behavioral: Negative.      Objective:   Vitals:   07/20/22 0952  BP: 128/70  Pulse: 68  SpO2: 99%  Weight: 154 lb (69.9 kg)  Height: 5\' 11"  (1.803 m)   Physical Exam Constitutional:      General: He is not in acute distress.    Comments: thin  HENT:     Head: Normocephalic and atraumatic.  Eyes:     Conjunctiva/sclera: Conjunctivae normal.  Cardiovascular:     Rate and Rhythm: Normal rate and regular rhythm.     Pulses: Normal pulses.     Heart sounds: Normal heart sounds. No murmur heard. Pulmonary:     Effort: Pulmonary effort is normal.     Breath sounds: Decreased air movement present. Decreased breath sounds present. No wheezing, rhonchi or rales.  Musculoskeletal:     Right lower leg: No edema.     Left lower leg: No edema.  Skin:    General: Skin is warm and dry.  Neurological:     General: No focal deficit present.     Mental Status: He is alert.    CBC    Component Value Date/Time   WBC 13.0 (H) 03/29/2022 0410   RBC 4.04 (L) 03/29/2022 0410   HGB 10.1 (L) 03/29/2022 0410   HGB 13.0 11/03/2020 1033   HCT 35.2 (L) 03/29/2022 0410   HCT 40.0 11/03/2020 1033   PLT 373 03/29/2022 0410   PLT 301 11/03/2020 1033   MCV 87.1 03/29/2022 0410   MCV 96 11/03/2020 1033  MCH 25.0 (L) 03/29/2022 0410   MCHC 28.7 (L) 03/29/2022 0410   RDW 15.9 (H) 03/29/2022 0410   RDW 11.7 11/03/2020 1033   LYMPHSABS 1.6 03/29/2022 0410   MONOABS 1.2 (H) 03/29/2022 0410   EOSABS 0.1 03/29/2022 0410   BASOSABS 0.0 03/29/2022 0410      Latest Ref Rng & Units 03/29/2022    4:10 AM 05/11/2021   12:01 PM 11/03/2020   10:33 AM  BMP  Glucose 70 - 99 mg/dL 133  108  108   BUN 8 - 23 mg/dL 16  13  17    Creatinine 0.61 -  1.24 mg/dL 1.18  1.21  1.22   BUN/Creat Ratio 10 - 24  11  14    Sodium 135 - 145 mmol/L 140  140  143   Potassium 3.5 - 5.1 mmol/L 3.8  3.6  4.1   Chloride 98 - 111 mmol/L 106  98  100   CO2 22 - 32 mmol/L 26  27  26    Calcium 8.9 - 10.3 mg/dL 9.4  9.3  9.3    Chest imaging: CT Chest 06/16/21 8 mm irregular nodular density is noted anteriorly in the right upper lobe. Non-contrast chest CT at 6-12 months is recommended. If the nodule is stable at time of repeat CT, then future CT at 18-24 months (from today's scan) is considered optional for low-risk patients, but is recommended for high-risk patients.  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  He is to continue on supplemental oxygen 3L at rest and 4L with exertion. He is to continue oxygen use at night when sleeping.   He is to continue on stiolto and asmanex inhalers daily. He can continue nebulizer treatments as needed. He is to continue azithromycin 3 days per week. He can continue prednisone 10mg  daily. Continue montelukast 10mg  daily.  Follow up in 6 months.   Freda Jackson, MD [facility] Office: [phone]   Current Outpatient Medications:    albuterol (PROVENTIL) (2.5 MG/3ML) 0.083% nebulizer solution, Take 3 mLs (2.5 mg total) by nebulization every 6 (six) hours as needed for wheezing or shortness of breath., Disp: 120 mL, Rfl: 6   azithromycin (ZITHROMAX) 250 MG tablet, Take two tablets by mouth initially  then one tablet daily for 4 more days. (Patient taking differently: Take 250 mg by mouth. Three times per week.), Disp: 6 tablet, Rfl: 0   azithromycin (ZITHROMAX) 500 MG tablet, One pill daily for three days as needed for a COPD exacerbation, Disp: 3 tablet, Rfl: 5   bisoprolol (ZEBETA) 5 MG tablet, TAKE 1 TABLET DAILY, Disp: 90 tablet, Rfl: 3   cholecalciferol (VITAMIN D) 1000 units tablet, Take 1,000 Units by mouth daily., Disp: , Rfl:    guaiFENesin-codeine 100-10 MG/5ML syrup, TAKE 5 ML TWICE A DAY AS NEEDED FOR COUGH, Disp: 120 mL, Rfl: 2   hydrochlorothiazide (HYDRODIURIL) 25 MG tablet, Take 1 tablet (25 mg total) by mouth daily., Disp:  90 tablet, Rfl: 3   mometasone (ASMANEX) 220 MCG/INH inhaler, Inhale 2 puffs into the lungs at bedtime., Disp: , Rfl:    montelukast (SINGULAIR) 10 MG tablet, TAKE 1 TABLET AT BEDTIME, Disp: 90 tablet, Rfl: 1   Multiple Vitamins-Minerals (CENTRUM SILVER ADULT 50+ PO), Take 1 tablet by mouth daily. , Disp: , Rfl:    omeprazole (PRILOSEC) 40 MG capsule, TAKE 1 CAPSULE DAILY, Disp: 90 capsule, Rfl: 3   predniSONE (DELTASONE) 5 MG tablet, Take 5 mg by mouth daily with breakfast., Disp: , Rfl:    predniSONE (DELTASONE) 50 MG tablet, One pill daily for five days as needed for a COPD exacerbation, Disp: 5 tablet, Rfl: 5   rosuvastatin (CRESTOR) 20 MG tablet, TAKE 1 TABLET DAILY, Disp: 90 tablet, Rfl: 3   tamsulosin (FLOMAX) 0.4 MG CAPS capsule, Take 2 capsules (0.8 mg total) by mouth daily after supper. (Patient taking differently: Take 0.8 mg by mouth 2 (two) times daily.), Disp: 180 capsule, Rfl: 3   Tiotropium Bromide-Olodaterol 2.5-2.5 MCG/ACT AERS, Inhale 2 puffs into the lungs daily., Disp: , Rfl:    traZODone (DESYREL) 100 MG tablet, TAKE 1 TABLET AT BEDTIME, Disp: 90 tablet, Rfl: 3   VENTOLIN HFA 108 (90 Base) MCG/ACT inhaler, USE 2 INHALATIONS EVERY 4 HOURS AS NEEDED. RESCUE FOR SHORTNESS OF BREATH, Disp: 54 g, Rfl: 1   apixaban (ELIQUIS) 5 MG TABS tablet,  Take 1 tablet (5 mg total) by mouth 2 (two) times daily., Disp: 180 tablet, Rfl: 3

## 2022-07-21 DIAGNOSIS — J449 Chronic obstructive pulmonary disease, unspecified: Secondary | ICD-10-CM | POA: Diagnosis not present

## 2022-07-27 DIAGNOSIS — J449 Chronic obstructive pulmonary disease, unspecified: Secondary | ICD-10-CM | POA: Diagnosis not present

## 2022-08-21 DIAGNOSIS — J449 Chronic obstructive pulmonary disease, unspecified: Secondary | ICD-10-CM | POA: Diagnosis not present

## 2022-08-24 ENCOUNTER — Encounter: Payer: Self-pay | Admitting: Family Medicine

## 2022-09-03 DIAGNOSIS — J449 Chronic obstructive pulmonary disease, unspecified: Secondary | ICD-10-CM | POA: Diagnosis not present

## 2022-09-19 DIAGNOSIS — J449 Chronic obstructive pulmonary disease, unspecified: Secondary | ICD-10-CM | POA: Diagnosis not present

## 2022-09-21 ENCOUNTER — Other Ambulatory Visit: Payer: Self-pay | Admitting: Family Medicine

## 2022-09-21 DIAGNOSIS — E7800 Pure hypercholesterolemia, unspecified: Secondary | ICD-10-CM

## 2022-10-20 DIAGNOSIS — J449 Chronic obstructive pulmonary disease, unspecified: Secondary | ICD-10-CM | POA: Diagnosis not present

## 2022-10-21 ENCOUNTER — Other Ambulatory Visit: Payer: Self-pay | Admitting: Pulmonary Disease

## 2022-10-21 DIAGNOSIS — J449 Chronic obstructive pulmonary disease, unspecified: Secondary | ICD-10-CM

## 2022-10-22 ENCOUNTER — Other Ambulatory Visit: Payer: Self-pay

## 2022-10-22 MED ORDER — TRAZODONE HCL 100 MG PO TABS: 100.0000 mg | ORAL_TABLET | Freq: Every day | ORAL | 0 refills | Status: DC

## 2022-11-15 ENCOUNTER — Telehealth: Payer: Self-pay | Admitting: Family Medicine

## 2022-11-15 NOTE — Telephone Encounter (Signed)
Contacted Michael Robertson. to schedule their annual wellness visit. Appointment made for 11/18/2022.  Thank you,  [facility] Support [facility] Direct dial  [phone]

## 2022-11-17 NOTE — Patient Instructions (Signed)
Health Maintenance, Male Adopting a healthy lifestyle and getting preventive care are important in promoting health and wellness. Ask your health care provider about: The right schedule for you to have regular tests and exams. Things you can do on your own to prevent diseases and keep yourself healthy. What should I know about diet, weight, and exercise? Eat a healthy diet  Eat a diet that includes plenty of vegetables, fruits, low-fat dairy products, and lean protein. Do not eat a lot of foods that are high in solid fats, added sugars, or sodium. Maintain a healthy weight Body mass index (BMI) is a measurement that can be used to identify possible weight problems. It estimates body fat based on height and weight. Your health care provider can help determine your BMI and help you achieve or maintain a healthy weight. Get regular exercise Get regular exercise. This is one of the most important things you can do for your health. Most adults should: Exercise for at least 150 minutes each week. The exercise should increase your heart rate and make you sweat (moderate-intensity exercise). Do strengthening exercises at least twice a week. This is in addition to the moderate-intensity exercise. Spend less time sitting. Even light physical activity can be beneficial. Watch cholesterol and blood lipids Have your blood tested for lipids and cholesterol at 78 years of age, then have this test every 5 years. You may need to have your cholesterol levels checked more often if: Your lipid or cholesterol levels are high. You are older than 78 years of age. You are at high risk for heart disease. What should I know about cancer screening? Many types of cancers can be detected early and may often be prevented. Depending on your health history and family history, you may need to have cancer screening at various ages. This may include screening for: Colorectal cancer. Prostate cancer. Skin cancer. Lung  cancer. What should I know about heart disease, diabetes, and high blood pressure? Blood pressure and heart disease High blood pressure causes heart disease and increases the risk of stroke. This is more likely to develop in people who have high blood pressure readings or are overweight. Talk with your health care provider about your target blood pressure readings. Have your blood pressure checked: Every 3-5 years if you are 18-39 years of age. Every year if you are 40 years old or older. If you are between the ages of 65 and 75 and are a current or former smoker, ask your health care provider if you should have a one-time screening for abdominal aortic aneurysm (AAA). Diabetes Have regular diabetes screenings. This checks your fasting blood sugar level. Have the screening done: Once every three years after age 45 if you are at a normal weight and have a low risk for diabetes. More often and at a younger age if you are overweight or have a high risk for diabetes. What should I know about preventing infection? Hepatitis B If you have a higher risk for hepatitis B, you should be screened for this virus. Talk with your health care provider to find out if you are at risk for hepatitis B infection. Hepatitis C Blood testing is recommended for: Everyone born from 1945 through 1965. Anyone with known risk factors for hepatitis C. Sexually transmitted infections (STIs) You should be screened each year for STIs, including gonorrhea and chlamydia, if: You are sexually active and are younger than 78 years of age. You are older than 78 years of age and your   health care provider tells you that you are at risk for this type of infection. Your sexual activity has changed since you were last screened, and you are at increased risk for chlamydia or gonorrhea. Ask your health care provider if you are at risk. Ask your health care provider about whether you are at high risk for HIV. Your health care provider  may recommend a prescription medicine to help prevent HIV infection. If you choose to take medicine to prevent HIV, you should first get tested for HIV. You should then be tested every 3 months for as long as you are taking the medicine. Follow these instructions at home: Alcohol use Do not drink alcohol if your health care provider tells you not to drink. If you drink alcohol: Limit how much you have to 0-2 drinks a day. Know how much alcohol is in your drink. In the [place] one drink equals one 12 oz bottle of beer (355 mL), one 5 oz glass of wine (148 mL), or one 1 oz glass of hard liquor (44 mL). Lifestyle Do not use any products that contain nicotine or tobacco. These products include cigarettes, chewing tobacco, and vaping devices, such as e-cigarettes. If you need help quitting, ask your health care provider. Do not use street drugs. Do not share needles. Ask your health care provider for help if you need support or information about quitting drugs. General instructions Schedule regular health, dental, and eye exams. Stay current with your vaccines. Tell your health care provider if: You often feel depressed. You have ever been abused or do not feel safe at home. Summary Adopting a healthy lifestyle and getting preventive care are important in promoting health and wellness. Follow your health care provider's instructions about healthy diet, exercising, and getting tested or screened for diseases. Follow your health care provider's instructions on monitoring your cholesterol and blood pressure. This information is not intended to replace advice given to you by your health care provider. Make sure you discuss any questions you have with your health care provider. Document Revised: 11/24/2020 Document Reviewed: 11/24/2020 Elsevier Patient Education  2023 Elsevier Inc.  

## 2022-11-17 NOTE — Progress Notes (Unsigned)
I connected with  Michael Robertson. on 11/18/2022 by a audio enabled telemedicine application and verified that I am speaking with the correct person using two identifiers.  Patient Location: Home  Provider Location: Home Office  I discussed the limitations of evaluation and management by telemedicine. The patient expressed understanding and agreed to proceed.   Subjective:   Michael Robertson. is a 77 y.o. male who presents for Medicare Annual/Subsequent preventive examination.  Review of Systems    Per HPI unless specifically indicated below.  Cardiac Risk Factors include: advanced age (>70men, >61 women);male gender, Hypertension, and Hypercholesteremia.           Objective:       07/20/2022    9:52 AM 06/03/2022   11:13 AM 06/03/2022   10:41 AM  Vitals with BMI  Height 5\' 11"   5\' 11"   Weight 154 lbs  155 lbs 13 oz  BMI 21.49  21.74  Systolic 128 130 161  Diastolic 70 70 66  Pulse 68  72    There were no vitals filed for this visit. There is no height or weight on file to calculate BMI.     11/18/2022    9:46 AM 06/03/2022   10:42 AM 10/12/2021   11:19 AM 10/07/2021    1:34 PM 09/30/2021    3:21 PM 05/11/2021   11:18 AM 01/24/2020   12:25 PM  Advanced Directives  Does Patient Have a Medical Advance Directive? Yes No Yes Yes Yes No No  Type of Estate agent of [place];Living will  Healthcare Power of [place];Living will Healthcare Power of [place];Living will Healthcare Power of [place];Living will    Does patient want to make changes to medical advance directive? No - Patient declined   No - Patient declined No - Patient declined    Copy of Healthcare Power of Attorney in Chart? No - copy requested    Yes - validated most recent copy scanned in chart (See row information)    Would patient like information on creating a medical advance directive?  No - Patient declined    No - Patient declined No - Patient declined    Current Medications  (verified) Outpatient Encounter Medications as of 11/18/2022  Medication Sig   albuterol (PROVENTIL) (2.5 MG/3ML) 0.083% nebulizer solution Take 3 mLs (2.5 mg total) by nebulization every 6 (six) hours as needed for wheezing or shortness of breath.   azithromycin (ZITHROMAX) 250 MG tablet Take two tablets by mouth initially then one tablet daily for 4 more days. (Patient taking differently: Take 250 mg by mouth. Three times per week.)   azithromycin (ZITHROMAX) 500 MG tablet One pill daily for three days as needed for a COPD exacerbation   bisoprolol (ZEBETA) 5 MG tablet TAKE 1 TABLET DAILY   cholecalciferol (VITAMIN D) 1000 units tablet Take 1,000 Units by mouth daily.   guaiFENesin-codeine 100-10 MG/5ML syrup TAKE 5 ML TWICE A DAY AS NEEDED FOR COUGH   hydrochlorothiazide (HYDRODIURIL) 25 MG tablet Take 1 tablet (25 mg total) by mouth daily.   mometasone (ASMANEX) 220 MCG/INH inhaler Inhale 2 puffs into the lungs at bedtime.   montelukast (SINGULAIR) 10 MG tablet TAKE 1 TABLET AT BEDTIME   Multiple Vitamins-Minerals (CENTRUM SILVER ADULT 50+ PO) Take 1 tablet by mouth daily.    omeprazole (PRILOSEC) 40 MG capsule TAKE 1 CAPSULE DAILY   predniSONE (DELTASONE) 5 MG tablet Take 5 mg by mouth daily with breakfast.   predniSONE (DELTASONE)  50 MG tablet One pill daily for five days as needed for a COPD exacerbation   rosuvastatin (CRESTOR) 20 MG tablet TAKE 1 TABLET DAILY   tamsulosin (FLOMAX) 0.4 MG CAPS capsule Take 2 capsules (0.8 mg total) by mouth daily after supper. (Patient taking differently: Take 0.8 mg by mouth 2 (two) times daily.)   Tiotropium Bromide-Olodaterol 2.5-2.5 MCG/ACT AERS Inhale 2 puffs into the lungs daily.   traZODone (DESYREL) 100 MG tablet Take 1 tablet (100 mg total) by mouth at bedtime.   apixaban (ELIQUIS) 5 MG TABS tablet Take 1 tablet (5 mg total) by mouth 2 (two) times daily.   VENTOLIN HFA 108 (90 Base) MCG/ACT inhaler USE 2 INHALATIONS EVERY 4 HOURS AS NEEDED. RESCUE  FOR SHORTNESS OF BREATH (Patient not taking: Reported on 11/18/2022)   No facility-administered encounter medications on file as of 11/18/2022.    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 No pertinent surgical history. Family History  Problem Relation Age of Onset   Stroke Mother    Dementia Mother    Diabetes Mother    Cancer Father        colon   Stroke Father    Alzheimer's disease Sister    Diabetes Sister    Dementia Sister    Obesity Daughter    Diabetes Daughter    Hypertension Daughter    Stroke Son    Social History   Socioeconomic History   Marital status: Widowed    Spouse name: Shirlee Limerick- passed away September 12, 2021   Number of children: 4   Years of education: 83- some college   Highest education level: 12th grade  Occupational History   Occupation: Retired- Paediatric nurse: RETIRED   Occupation: RetiredProduct manager   Tobacco Use   Smoking status: Former    Packs/day: 1.75    Years: 40.00    Additional pack years: 0.00    Total pack years: 70.00    Types: Cigarettes    Quit date: 03/01/2004    Years since quitting: 18.7    Passive exposure: Past   Smokeless tobacco: Never  Vaping Use   Vaping Use: Never used  Substance and Sexual Activity   Alcohol use: No   Drug use: No    Comment: Hx of use   Sexual activity: Not Currently  Other Topics Concern   Not on file  Social History Narrative   Patient lives with his son.    His wife recent passed away in 09/12/21.   Patient does drive occasionally. His son does must of his shopping.    Patient is on home oxygen. Patient uses a scooter when out of his home.    Social Determinants of Health   Financial Resource  Strain: Low Risk  (11/18/2022)   Overall Financial Resource Strain (CARDIA)    Difficulty of Paying Living Expenses: Not hard at all  Food Insecurity: No Food Insecurity (11/18/2022)   Hunger Vital Sign    Worried About Running Out of Food in the Last Year: Never true    Ran Out of Food in the Last Year: Never true  Transportation Needs: No Transportation Needs (11/18/2022)  PRAPARE - Administrator, Civil Service (Medical): No    Lack of Transportation (Non-Medical): No  Physical Activity: Insufficiently Active (11/18/2022)   Exercise Vital Sign    Days of Exercise per Week: 1 day    Minutes of Exercise per Session: 20 min  Stress: No Stress Concern Present (11/18/2022)   [employer] of Occupational Health - Occupational Stress Questionnaire    Feeling of Stress : Not at all  Social Connections: Socially Isolated (11/18/2022)   Social Connection and Isolation Panel [NHANES]    Frequency of Communication with Friends and Family: More than three times a week    Frequency of Social Gatherings with Friends and Family: More than three times a week    Attends Religious Services: Never    Database administrator or Organizations: No    Attends Banker Meetings: Never    Marital Status: Widowed    Tobacco Counseling Counseling given: No   Clinical Intake:  Pre-visit preparation completed: No  Pain : No/denies pain     Nutritional Status: BMI of 19-24  Normal Nutritional Risks: None Diabetes: Yes CBG done?: No Did pt. bring in CBG monitor from home?: No  How often do you need to have someone help you when you read instructions, pamphlets, or other written materials from your doctor or pharmacy?: 1 - Never  Diabetic?Nutrition Risk Assessment:  Has the patient had any N/V/D within the last 2 months?  No  Does the patient have any non-healing wounds?  No  Has the patient had any unintentional weight loss or weight gain?  No   Diabetes:  Is the patient  diabetic?  Yes  If diabetic, was a CBG obtained today?  No  Did the patient bring in their glucometer from home?  No  How often do you monitor your CBG's? never.   Financial Strains and Diabetes Management:  Are you having any financial strains with the device, your supplies or your medication? No .  Does the patient want to be seen by Chronic Care Management for management of their diabetes?  No  Would the patient like to be referred to a Nutritionist or for Diabetic Management?  No   Diabetic Exams:  Diabetic Eye Exam: Overdue for diabetic eye exam. Pt has been advised about the importance in completing this exam. Patient advised to call and schedule an eye exam. Diabetic Foot Exam: Overdue, Pt has been advised about the importance in completing this exam. Pt is scheduled for diabetic foot exam on at next diabetic visit .   Interpreter Needed?: No  Information entered by :: Laurel Dimmer, CMA   Activities of Daily Living    11/18/2022    9:40 AM  In your present state of health, do you have any difficulty performing the following activities:  Hearing? 1  Vision? 1  Comment Wear glasses, Eye Exam 08/23/2022, Dr. Sabino Donovan  Difficulty concentrating or making decisions? 0  Walking or climbing stairs? 1  Dressing or bathing? 0  Doing errands, shopping? 0    Patient Care Team: Latrelle Dodrill, MD as PCP - General (Family Medicine) Martina Sinner, MD as Consulting Physician (Pulmonary Disease) Genia Del Daisy Blossom, MD as Consulting Physician (Ophthalmology)  Indicate any recent Medical Services you may have received from other than [facility] providers in the past year (date may be approximate).     Assessment:   This is a routine wellness examination for Jameir.  Hearing/Vision screen Denies any hearing issues.  Denies any change to her vision. Wear Eye glasses, Annual Eye Exam, [facility]    Dietary issues and exercise activities discussed: Current Exercise  Habits: Home exercise routine, Type of exercise: walking, Time (Minutes): 15, Frequency (Times/Week): 1, Weekly Exercise (Minutes/Week): 15, Intensity: Mild, Exercise limited by: respiratory conditions(s)   Goals Addressed   None    Depression Screen    11/18/2022    9:39 AM 04/15/2022    9:17 AM 10/12/2021   11:18 AM 10/07/2021    1:34 PM 09/30/2021    3:19 PM 05/11/2021   11:16 AM 11/03/2020    9:52 AM  PHQ 2/9 Scores  PHQ - 2 Score 0 2 2 3 2 2 3   PHQ- 9 Score  3 6 8 8 8 7     Fall Risk    11/18/2022    9:40 AM 04/15/2022    9:17 AM 09/30/2021    3:21 PM 05/11/2021   11:16 AM 05/08/2020    8:52 AM  Fall Risk   Falls in the past year? 0 0 0 0 0  Number falls in past yr: 0  0 0 0  Injury with Fall? 0  0 0   Risk for fall due to : No Fall Risks  No Fall Risks    Follow up Falls evaluation completed  Falls prevention discussed  Falls evaluation completed    FALL RISK PREVENTION PERTAINING TO THE HOME:  Any stairs in or around the home? Yes  If so, are there any without handrails? No  Home free of loose throw rugs in walkways, pet beds, electrical cords, etc? Yes  Adequate lighting in your home to reduce risk of falls? Yes   ASSISTIVE DEVICES UTILIZED TO PREVENT FALLS:  Life alert? No  Use of a cane, walker or w/c? No  Grab bars in the bathroom? No  Shower chair or bench in shower? Yes  Elevated toilet seat or a handicapped toilet? No   TIMED UP AND GO:  Was the test performed?Unable to perform, virtual appointment   Cognitive Function:    03/02/2011    4:00 PM  MMSE - Mini Mental State Exam  Orientation to time 5  Orientation to Place 5  Registration 3  Attention/ Calculation 5  Recall 3  Language- name 2 objects 2  Language- repeat 1  Language- follow 3 step command 3  Language- read & follow direction 1  Write a sentence 1  Copy design 0  Total score 29        11/18/2022    9:44 AM 09/30/2021    3:23 PM 02/21/2019    9:59 AM  6CIT Screen  What Year? 0  points 0 points 0 points  What month? 0 points 0 points 0 points  What time? 0 points 0 points 0 points  Count back from 20 0 points 0 points 0 points  Months in reverse 0 points 0 points 2 points  Repeat phrase 0 points 0 points 0 points  Total Score 0 points 0 points 2 points    Immunizations Immunization History  Administered Date(s) Administered   Fluad Quad(high Dose 65+) 05/11/2021, 04/15/2022   Influenza Split 04/09/2011, 04/04/2012   Influenza Whole 06/12/2007, 05/06/2008, 05/20/2009, 04/17/2010   Influenza,inj,Quad PF,6+ Mos 04/06/2013, 04/10/2014, 04/03/2015, 04/07/2016, 04/14/2017, 04/05/2018, 04/12/2019   Influenza-Unspecified 04/15/2020   Moderna Covid-19 Vaccine Bivalent Booster 45yrs & up 06/03/2021   Moderna Sars-Covid-2 Vaccination 09/01/2019, 09/30/2019, 05/13/2020   Pneumococcal Conjugate-13 04/06/2013   Pneumococcal Polysaccharide-23 06/19/2003,  10/08/2009, 04/04/2015   Td 07/20/2003   Tdap 09/28/2013   Zoster Recombinat (Shingrix) 01/12/2021, 03/16/2021    TDAP status: Up to date  Flu Vaccine status: Up to date  Pneumococcal vaccine status: Up to date  Covid-19 vaccine status: Completed vaccines  Qualifies for Shingles Vaccine? Yes   Zostavax completed Yes   Shingrix Completed?: Yes  Screening Tests Health Maintenance  Topic Date Due   OPHTHALMOLOGY EXAM  Never done   FOOT EXAM  11/11/2020   HEMOGLOBIN A1C  12/02/2022   INFLUENZA VACCINE  02/17/2023   DTaP/Tdap/Td (3 - Td or Tdap) 09/29/2023   Pneumonia Vaccine 35+ Years old  Completed   COVID-19 Vaccine  Completed   Hepatitis C Screening  Completed   Zoster Vaccines- Shingrix  Completed   HPV VACCINES  Aged Out   COLONOSCOPY (Pts 45-37yrs Insurance coverage will need to be confirmed)  Discontinued    Health Maintenance  Health Maintenance Due  Topic Date Due   OPHTHALMOLOGY EXAM  Never done   FOOT EXAM  11/11/2020    Colorectal cancer screening: No longer required.   Lung Cancer  Screening: (Low Dose CT Chest recommended if Age 49-80 years, 30 pack-year currently smoking OR have quit w/in 15years.) does not qualify.   Lung Cancer Screening Referral: not applicable   Additional Screening:  Hepatitis C Screening: does qualify; Completed 08/22/2015  Vision Screening: Recommended annual ophthalmology exams for early detection of glaucoma and other disorders of the eye. Is the patient up to date with their annual eye exam?  Yes  Who is the provider or what is the name of the office in which the patient attends annual eye exams? Dr. Sabino Donovan If pt is not established with a provider, would they like to be referred to a provider to establish care? No .   Dental Screening: Recommended annual dental exams for proper oral hygiene  Community Resource Referral / Chronic Care Management: CRR required this visit?  No   CCM required this visit?  No      Plan:     I have personally reviewed and noted the following in the patient's chart:   Medical and social history Use of alcohol, tobacco or illicit drugs  Current medications and supplements including opioid prescriptions. Patient is not currently taking opioid prescriptions. Functional ability and status Nutritional status Physical activity Advanced directives List of other physicians Hospitalizations, surgeries, and ER visits in previous 12 months Vitals Screenings to include cognitive, depression, and falls Referrals and appointments  In addition, I have reviewed and discussed with patient certain preventive protocols, quality metrics, and best practice recommendations. A written personalized care plan for preventive services as well as general preventive health recommendations were provided to patient.    Mr. Denardo , Thank you for taking time to come for your Medicare Wellness Visit. I appreciate your ongoing commitment to your health goals. Please review the following plan we discussed and let me know if I can  assist you in the future.   These are the goals we discussed:  Goals      HEMOGLOBIN A1C < 7     05/12/2021 A1c 6.8     Weight < 160 lb (72.576 kg)        This is a list of the screening recommended for you and due dates:  Health Maintenance  Topic Date Due   Eye exam for diabetics  Never done   Complete foot exam   11/11/2020   Hemoglobin A1C  12/02/2022   Flu Shot  02/17/2023   DTaP/Tdap/Td vaccine (3 - Td or Tdap) 09/29/2023   Pneumonia Vaccine  Completed   COVID-19 Vaccine  Completed   Hepatitis C Screening: USPSTF Recommendation to screen - Ages 41-79 yo.  Completed   Zoster (Shingles) Vaccine  Completed   HPV Vaccine  Aged Out   Colon Cancer Screening  Discontinued     Mr. Sanagustin , Thank you for taking time to come for your Medicare Wellness Visit. I appreciate your ongoing commitment to your health goals. Please review the following plan we discussed and let me know if I can assist you in the future.   These are the goals we discussed:  Goals      HEMOGLOBIN A1C < 7     05/12/2021 A1c 6.8     Weight < 160 lb (72.576 kg)        This is a list of the screening recommended for you and due dates:  Health Maintenance  Topic Date Due   Eye exam for diabetics  Never done   Complete foot exam   11/11/2020   Hemoglobin A1C  12/02/2022   Flu Shot  02/17/2023   DTaP/Tdap/Td vaccine (3 - Td or Tdap) 09/29/2023   Pneumonia Vaccine  Completed   COVID-19 Vaccine  Completed   Hepatitis C Screening: USPSTF Recommendation to screen - Ages 38-79 yo.  Completed   Zoster (Shingles) Vaccine  Completed   HPV Vaccine  Aged Out   Colon Cancer Screening  Discontinued    Lonna Cobb, [facility]   11/18/2022   Nurse Notes: Approximately 30 minute Non-Face -To-Face Medicare Wellness Visit

## 2022-11-18 ENCOUNTER — Ambulatory Visit (INDEPENDENT_AMBULATORY_CARE_PROVIDER_SITE_OTHER): Payer: Medicare PPO

## 2022-11-18 DIAGNOSIS — Z0000 Encounter for general adult medical examination without abnormal findings: Secondary | ICD-10-CM

## 2022-11-19 DIAGNOSIS — J449 Chronic obstructive pulmonary disease, unspecified: Secondary | ICD-10-CM | POA: Diagnosis not present

## 2022-11-23 DIAGNOSIS — J449 Chronic obstructive pulmonary disease, unspecified: Secondary | ICD-10-CM | POA: Diagnosis not present

## 2022-11-24 ENCOUNTER — Encounter: Payer: Self-pay | Admitting: Family Medicine

## 2022-11-24 ENCOUNTER — Ambulatory Visit (INDEPENDENT_AMBULATORY_CARE_PROVIDER_SITE_OTHER): Payer: Medicare PPO | Admitting: Family Medicine

## 2022-11-24 VITALS — BP 136/81 | HR 84 | Ht 71.0 in | Wt 158.8 lb

## 2022-11-24 DIAGNOSIS — N1831 Chronic kidney disease, stage 3a: Secondary | ICD-10-CM

## 2022-11-24 DIAGNOSIS — G4700 Insomnia, unspecified: Secondary | ICD-10-CM | POA: Diagnosis not present

## 2022-11-24 DIAGNOSIS — J439 Emphysema, unspecified: Secondary | ICD-10-CM

## 2022-11-24 DIAGNOSIS — K5900 Constipation, unspecified: Secondary | ICD-10-CM

## 2022-11-24 DIAGNOSIS — I10 Essential (primary) hypertension: Secondary | ICD-10-CM | POA: Diagnosis not present

## 2022-11-24 DIAGNOSIS — I480 Paroxysmal atrial fibrillation: Secondary | ICD-10-CM | POA: Diagnosis not present

## 2022-11-24 DIAGNOSIS — K219 Gastro-esophageal reflux disease without esophagitis: Secondary | ICD-10-CM | POA: Diagnosis not present

## 2022-11-24 DIAGNOSIS — E1122 Type 2 diabetes mellitus with diabetic chronic kidney disease: Secondary | ICD-10-CM

## 2022-11-24 DIAGNOSIS — E7800 Pure hypercholesterolemia, unspecified: Secondary | ICD-10-CM

## 2022-11-24 NOTE — Patient Instructions (Signed)
It was nice to meet you today!  Checking labwork today Follow up with Dr. Linwood Dibbles in 6 months, sooner if needed  For constipation can do miralax 1 capful daily as needed  Be well, Dr. Pollie Meyer

## 2022-11-24 NOTE — Progress Notes (Signed)
  Date of Visit: 11/24/2022   SUBJECTIVE:   HPI:  Michael Robertson presents today for routine follow-up.  This is my first time meeting him.  He was previously a patient of Dr. Leveda Robertson who recently retired.  He will be transitioned to Dr. Madelaine Robertson panel when she starts here in late June.  Hypertension - currently taking bisoprolol 5mg  daily, HCTZ 25mg  daily.  He is tolerating these medications well.  PAF - currently taking eliquis 5mg  twice daily, bisoprolol 5mg  daily.  Denies blood in his stool.  COPD - currently taking mometasone 2 puffs at night, montelukast 10mg  daily, and albuterol as needed. Wears oxygen 2L at all times, sometimes does 2.5L at home, does 3L when out and about with portable concentrator. Taking suppressive azithro 250mg  three times per week as well as takes prednisone 5mg  daily to control daily COPD symptoms.  His prior PCP Dr. Leveda Robertson also prescribed him preemptive bursts of steroids (prednisone 50 mg daily for 5 days) and azithromycin 500 mg daily for 3 days, which patient keeps on hand in the event of an exacerbation.  Hyperlipidemia - currently taking crestor 20mg  daily.  Agreeable with getting lipids checked today.  Insomnia - currently taking trazodone 100mg  at night.  Doing well with this medicine.  BPH - currently taking flomax 0.8mg  daily. Getting up just once per night, doing better with this regimen.  GERD - currently taking omeprazole 40mg  daily.  No concerns with meds.  Diabetes - not currently on any diabetes medications.  Due for A1c today.  Had last eye exam earlier this year.  Constipated - stools are hard, has to push to get stools out. Tried prune juice.  No blood in stool.  He has a longstanding history of constipation and reports that this is consistent with his prior symptoms.  OBJECTIVE:   BP 136/81   Pulse 84   Ht 5\' 11"  (1.803 m)   Wt 158 lb 12.8 oz (72 kg)   SpO2 100%   BMI 22.15 kg/m  Gen: No acute distress, pleasant, cooperative,  well-appearing HEENT: Normocephalic, atraumatic, nasal cannula in place Heart: Regular rate and rhythm, no murmur Lungs: Clear to auscultation bilaterally, prolonged expiratory phase, diminished aeration throughout Neuro: Alert, grossly nonfocal, speech normal Abdomen: Soft, nontender to palpation, no masses or organomegaly Ext: No edema bilateral lower extremities  ASSESSMENT/PLAN:   Health maintenance:  -sent message to request eye exam  HYPERTENSION, BENIGN SYSTEMIC Well controlled. Continue current medication regimen.   Hypercholesteremia Update lipids today.  Continue statin.  Insomnia Doing well on trazodone, continue.  G E R D Well-controlled with omeprazole.  Continue.  COPD (chronic obstructive pulmonary disease) with emphysema (HCC) Holding steady on present COPD regimen.  Continue prednisone 5 mg daily and azithromycin 250 mg 3 times a week for maintenance therapy.  He also keeps bursts of steroids and azithromycin on hand in the event of an exacerbation, continue.  No changes to inhaler regimen today.  PAF (paroxysmal atrial fibrillation) (HCC) Rate controlled today.  Tolerating Eliquis.  Continue current regimen.  DM (diabetes mellitus), type 2 with renal complications [facility]) Not presently on any medications for diabetes.  Update A1c today.  Constipation Uncontrolled.  Add MiraLAX, discussed titration.  Follow-up if not improving.  FOLLOW UP: Follow up in 6 months for routine follow-up with new PCP Dr. Linwood Robertson.  [place] J. Pollie Meyer, MD [facility]

## 2022-11-25 LAB — BASIC METABOLIC PANEL
BUN/Creatinine Ratio: 18 (ref 10–24)
BUN: 24 mg/dL (ref 8–27)
CO2: 23 mmol/L (ref 20–29)
Calcium: 9.5 mg/dL (ref 8.6–10.2)
Chloride: 101 mmol/L (ref 96–106)
Creatinine, Ser: 1.34 mg/dL — ABNORMAL HIGH (ref 0.76–1.27)
Glucose: 174 mg/dL — ABNORMAL HIGH (ref 70–99)
Potassium: 4.7 mmol/L (ref 3.5–5.2)
Sodium: 142 mmol/L (ref 134–144)
eGFR: 54 mL/min/{1.73_m2} — ABNORMAL LOW (ref >59)

## 2022-11-25 LAB — LIPID PANEL
Chol/HDL Ratio: 2.2 ratio (ref 0.0–5.0)
Cholesterol, Total: 165 mg/dL (ref 100–199)
HDL: 74 mg/dL (ref >39)
LDL Chol Calc (NIH): 73 mg/dL (ref 0–99)
Triglycerides: 101 mg/dL (ref 0–149)
VLDL Cholesterol Cal: 18 mg/dL (ref 5–40)

## 2022-11-25 LAB — HEMOGLOBIN A1C
Est. average glucose Bld gHb Est-mCnc: 163 mg/dL
Hgb A1c MFr Bld: 7.3 % — ABNORMAL HIGH (ref 4.8–5.6)

## 2022-11-26 NOTE — Assessment & Plan Note (Signed)
Update lipids today.  Continue statin. 

## 2022-11-26 NOTE — Assessment & Plan Note (Signed)
Holding steady on present COPD regimen.  Continue prednisone 5 mg daily and azithromycin 250 mg 3 times a week for maintenance therapy.  He also keeps bursts of steroids and azithromycin on hand in the event of an exacerbation, continue.  No changes to inhaler regimen today.

## 2022-11-26 NOTE — Assessment & Plan Note (Signed)
Uncontrolled.  Add MiraLAX, discussed titration.  Follow-up if not improving.

## 2022-11-26 NOTE — Assessment & Plan Note (Signed)
Well-controlled with omeprazole.  Continue.

## 2022-11-26 NOTE — Assessment & Plan Note (Signed)
Well controlled. Continue current medication regimen.  

## 2022-11-26 NOTE — Assessment & Plan Note (Signed)
Doing well on trazodone, continue  

## 2022-11-26 NOTE — Assessment & Plan Note (Signed)
Not presently on any medications for diabetes.  Update A1c today.

## 2022-11-26 NOTE — Assessment & Plan Note (Signed)
Rate controlled today.  Tolerating Eliquis.  Continue current regimen.

## 2022-11-29 LAB — HM DIABETES EYE EXAM

## 2022-12-03 ENCOUNTER — Encounter: Payer: Self-pay | Admitting: Family Medicine

## 2022-12-17 ENCOUNTER — Other Ambulatory Visit: Payer: Self-pay | Admitting: *Deleted

## 2022-12-17 DIAGNOSIS — J439 Emphysema, unspecified: Secondary | ICD-10-CM

## 2022-12-17 DIAGNOSIS — J441 Chronic obstructive pulmonary disease with (acute) exacerbation: Secondary | ICD-10-CM

## 2022-12-17 MED ORDER — PREDNISONE 50 MG PO TABS: ORAL_TABLET | ORAL | 5 refills | Status: DC

## 2022-12-20 DIAGNOSIS — J449 Chronic obstructive pulmonary disease, unspecified: Secondary | ICD-10-CM | POA: Diagnosis not present

## 2022-12-21 ENCOUNTER — Other Ambulatory Visit: Payer: Self-pay

## 2022-12-21 DIAGNOSIS — E7800 Pure hypercholesterolemia, unspecified: Secondary | ICD-10-CM

## 2022-12-24 MED ORDER — ROSUVASTATIN CALCIUM 20 MG PO TABS: 20.0000 mg | ORAL_TABLET | Freq: Every day | ORAL | 3 refills | Status: DC

## 2023-01-19 ENCOUNTER — Other Ambulatory Visit: Payer: Self-pay | Admitting: Family Medicine

## 2023-01-19 DIAGNOSIS — J449 Chronic obstructive pulmonary disease, unspecified: Secondary | ICD-10-CM | POA: Diagnosis not present

## 2023-01-31 DIAGNOSIS — J449 Chronic obstructive pulmonary disease, unspecified: Secondary | ICD-10-CM | POA: Diagnosis not present

## 2023-02-09 ENCOUNTER — Ambulatory Visit: Payer: Medicare PPO | Admitting: Pulmonary Disease

## 2023-02-11 ENCOUNTER — Encounter: Payer: Self-pay | Admitting: Pulmonary Disease

## 2023-02-11 ENCOUNTER — Ambulatory Visit: Payer: Medicare PPO | Admitting: Pulmonary Disease

## 2023-02-11 VITALS — BP 118/72 | HR 74 | Ht 71.0 in | Wt 158.8 lb

## 2023-02-11 DIAGNOSIS — J9611 Chronic respiratory failure with hypoxia: Secondary | ICD-10-CM | POA: Diagnosis not present

## 2023-02-11 DIAGNOSIS — J449 Chronic obstructive pulmonary disease, unspecified: Secondary | ICD-10-CM | POA: Diagnosis not present

## 2023-02-11 NOTE — Patient Instructions (Addendum)
Continue to use asmanex inhaler 2 puffs at bedtime   Continue stiolto inhaler 2 puffs daily   Continue montelukast 10mg  daily for sinus congestion and your breathing   Continue azithromycin 3 days per week   Continue albuterol inhaler as needed..    Follow up in 6 months

## 2023-02-11 NOTE — Progress Notes (Signed)
Synopsis: Referred in November 2022 for COPD/Resp failure, previously followed at [facility] clinic  Subjective:   PATIENT ID: Michael Robertson. GENDER: male DOB: [birth year], MRN: [id number]  HPI  Chief Complaint  Patient presents with   Follow-up    6 mo f/u for COPD. States he tends to have more SOB and wheezing due to the weather. Denies any increased coughing.    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   He reports 1 flare in his breathing since last visit, otherwise has been doing ok.   OV 07/20/22 His breathing has been stable since last visit. He continues on the same regimen as before. He continues to perform all ADLs independently.   OV 12/16/21 He is using 3-4L of O2. He reports his wife passed from liver cancer in September 08, 2022 and he had let himself go for a bit where he had a harder time breathing. He was treated with levaquin which helped his breathing.   His step son has moved in with him.   He continues on asmanex nightly and stiolto daily along with prednisone 10mg  daily and azithromycin 3 days per week.   He reports his breathing is roughly at baseline but overall very short of breath.  OV 05/25/21 He is currently on ICS/LAMA/LABA inhaler therapy, 10mg  prednisone daily, azithromycin 3 days per week and supplemental oxygen 2L at rest and 3L with exertion.  He is more short of breath over the last six months due to a covid infection in June and then another viral infection last month. He notices wheezing at night. He also has increased mucous production over the past couple of months.   Hot humid weather and allergies in the beginning of summer bother his breathing.   He is using flonase for sinus congestion and post nasal drainage as needed with good efficacy.    He has lost 10lbs in the last few months. He is more active due to helping his wife around  the home more as she is dealing with a liver cancer diagnosis.   He has serial CT Chest scans to follow a pulmonary nodule which has been stable over the past year.   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     Family History  Problem Relation Age of Onset   Stroke Mother    Dementia Mother    Diabetes Mother    Cancer Father        colon   Stroke Father    Alzheimer's disease Sister    Diabetes Sister    Dementia Sister    Obesity Daughter    Diabetes Daughter    Hypertension Daughter    Stroke Son      Social History   Socioeconomic History   Marital status: Widowed    Spouse name: Shirlee Limerick- passed away 09/08/2021   Number of children: 4   Years of education: 51- some college   Highest education level: 12th grade  Occupational History   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    His wife recent passed away in 09-19-21.   Patient does drive occasionally. His son does must of his shopping.    Patient is on home oxygen. Patient uses a scooter when out of his home.    Social Determinants of Health   Financial Resource Strain: Low Risk  (11/18/2022)   Overall  Financial Resource Strain (CARDIA)    Difficulty of Paying Living Expenses: Not hard at all  Food Insecurity: No Food Insecurity (11/18/2022)   Hunger Vital Sign    Worried About Running Out of Food in the Last Year: Never true    Ran Out of Food in the Last Year: Never true  Transportation Needs: No Transportation Needs (11/18/2022)   PRAPARE - Administrator, Civil Service (Medical): No    Lack of Transportation (Non-Medical): No  Physical Activity: Insufficiently Active (11/18/2022)   Exercise Vital Sign    Days of Exercise per Week: 1 day    Minutes of Exercise per Session: 20 min  Stress: No Stress Concern Present (11/18/2022)   [employer] of Occupational Health - Occupational Stress Questionnaire    Feeling of Stress : Not at all  Social Connections: Socially Isolated (11/18/2022)   Social Connection and Isolation Panel [NHANES]    Frequency of Communication with Friends and Family: More than three times a week    Frequency of Social Gatherings with Friends and Family: More than three times a week    Attends Religious Services: Never    Database administrator or Organizations: No    Attends Banker Meetings: Never    Marital Status: Widowed  Intimate Partner Violence: Not At Risk (11/18/2022)   Humiliation, Afraid, Rape, and Kick questionnaire    Fear of Current or Ex-Partner: No    Emotionally Abused: No    Physically Abused: No    Sexually Abused: No     Allergies  Allergen Reactions   Doxycycline     REACTION: vomiting     Outpatient Medications Prior to Visit  Medication Sig Dispense Refill   apixaban (ELIQUIS) 5 MG TABS tablet Take 1 tablet (5 mg total) by mouth 2 (two) times daily. 180 tablet 3   azithromycin (ZITHROMAX) 500 MG tablet One pill daily for three days as needed for a COPD exacerbation 3 tablet 5   bisoprolol (ZEBETA) 5 MG tablet TAKE 1 TABLET DAILY 90 tablet 3   cholecalciferol (VITAMIN D) 1000 units tablet Take 1,000 Units by  mouth daily.     guaiFENesin-codeine 100-10 MG/5ML syrup TAKE 5 ML TWICE A DAY AS NEEDED FOR COUGH 120 mL 2   hydrochlorothiazide (HYDRODIURIL) 25 MG tablet Take 1 tablet (25 mg total) by mouth daily. 90 tablet 3   mometasone (ASMANEX) 220 MCG/INH inhaler Inhale 2 puffs into the lungs at bedtime.  montelukast (SINGULAIR) 10 MG tablet TAKE 1 TABLET AT BEDTIME (Patient taking differently: Take 10 mg by mouth daily after breakfast.) 90 tablet 1   Multiple Vitamins-Minerals (CENTRUM SILVER ADULT 50+ PO) Take 1 tablet by mouth daily.      omeprazole (PRILOSEC) 40 MG capsule TAKE 1 CAPSULE DAILY 90 capsule 3   predniSONE (DELTASONE) 10 MG tablet Take 10 mg by mouth daily with breakfast.     rosuvastatin (CRESTOR) 20 MG tablet Take 1 tablet (20 mg total) by mouth daily. 90 tablet 3   tamsulosin (FLOMAX) 0.4 MG CAPS capsule Take 2 capsules (0.8 mg total) by mouth daily after supper. (Patient taking differently: Take 0.8 mg by mouth 2 (two) times daily.) 180 capsule 3   Tiotropium Bromide-Olodaterol 2.5-2.5 MCG/ACT AERS Inhale 2 puffs into the lungs daily.     traZODone (DESYREL) 100 MG tablet TAKE 1 TABLET AT BEDTIME 90 tablet 3   VENTOLIN HFA 108 (90 Base) MCG/ACT inhaler USE 2 INHALATIONS EVERY 4 HOURS AS NEEDED. RESCUE FOR SHORTNESS OF BREATH 54 g 1   predniSONE (DELTASONE) 5 MG tablet Take 5 mg by mouth daily with breakfast.     azithromycin (ZITHROMAX) 250 MG tablet Take two tablets by mouth initially then one tablet daily for 4 more days. (Patient taking differently: Take 250 mg by mouth. Three times per week.) 6 tablet 0   predniSONE (DELTASONE) 50 MG tablet One pill daily for five days as needed for a COPD exacerbation 5 tablet 5   No facility-administered medications prior to visit.   Review of Systems  Constitutional:  Negative for chills, fever, malaise/fatigue and weight loss.  HENT:  Negative for congestion, sinus pain and sore throat.   Eyes: Negative.   Respiratory:  Positive for  shortness of breath. Negative for cough, hemoptysis, sputum production and wheezing.   Cardiovascular:  Negative for chest pain, palpitations, orthopnea, claudication and leg swelling.  Gastrointestinal:  Negative for abdominal pain, heartburn, nausea and vomiting.  Genitourinary: Negative.   Musculoskeletal:  Negative for joint pain and myalgias.  Skin:  Negative for rash.  Neurological:  Negative for weakness.  Endo/Heme/Allergies: Negative.   Psychiatric/Behavioral: Negative.     Objective:   Vitals:   02/11/23 0830  BP: 118/72  Pulse: 74  SpO2: 97%  Weight: 158 lb 12.8 oz (72 kg)  Height: 5\' 11"  (1.803 m)    Physical Exam Constitutional:      General: He is not in acute distress.    Comments: thin  HENT:     Head: Normocephalic and atraumatic.  Eyes:     Conjunctiva/sclera: Conjunctivae normal.  Cardiovascular:     Rate and Rhythm: Normal rate and regular rhythm.     Pulses: Normal pulses.     Heart sounds: Normal heart sounds. No murmur heard. Pulmonary:     Effort: Pulmonary effort is normal.     Breath sounds: Decreased air movement present. Decreased breath sounds present. No wheezing, rhonchi or rales.  Musculoskeletal:     Right lower leg: No edema.     Left lower leg: No edema.  Skin:    General: Skin is warm and dry.  Neurological:     General: No focal deficit present.     Mental Status: He is alert.    CBC    Component Value Date/Time   WBC 13.0 (H) 03/29/2022 0410   RBC 4.04 (L) 03/29/2022 0410   HGB 10.1 (L) 03/29/2022 0410   HGB 13.0 11/03/2020 1033  HCT 35.2 (L) 03/29/2022 0410   HCT 40.0 11/03/2020 1033   PLT 373 03/29/2022 0410   PLT 301 11/03/2020 1033   MCV 87.1 03/29/2022 0410   MCV 96 11/03/2020 1033   MCH 25.0 (L) 03/29/2022 0410   MCHC 28.7 (L) 03/29/2022 0410   RDW 15.9 (H) 03/29/2022 0410   RDW 11.7 11/03/2020 1033   LYMPHSABS 1.6 03/29/2022 0410   MONOABS 1.2 (H) 03/29/2022 0410   EOSABS 0.1 03/29/2022 0410   BASOSABS 0.0  03/29/2022 0410      Latest Ref Rng & Units 11/24/2022    3:05 PM 03/29/2022    4:10 AM 05/11/2021   12:01 PM  BMP  Glucose 70 - 99 mg/dL 161  096  045   BUN 8 - 27 mg/dL 24  16  13    Creatinine 0.76 - 1.27 mg/dL 4.09  8.11  9.14   BUN/Creat Ratio 10 - 24 18   11    Sodium 134 - 144 mmol/L 142  140  140   Potassium 3.5 - 5.2 mmol/L 4.7  3.8  3.6   Chloride 96 - 106 mmol/L 101  106  98   CO2 20 - 29 mmol/L 23  26  27    Calcium 8.6 - 10.2 mg/dL 9.5  9.4  9.3    Chest imaging: CT Chest 06/16/21 8 mm irregular nodular density is noted anteriorly in the right upper lobe. Non-contrast chest CT at 6-12 months is recommended. If the nodule is stable at time of repeat CT, then future CT at 18-24 months (from today's scan) is considered optional for low-risk patients, but is recommended for high-risk patients.  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  He is to continue on supplemental oxygen 3L at rest and 4L with exertion. He is to continue oxygen use at night when sleeping.   He is to continue on stiolto and asmanex inhalers daily. He can continue nebulizer treatments as needed. He is to continue azithromycin 3 days per week. He can continue prednisone 10mg  daily. Continue montelukast 10mg  daily.  Follow up in 6 months.   Melody Comas, MD [facility] Office: [phone]   Current Outpatient Medications:    apixaban (ELIQUIS) 5 MG TABS tablet, Take 1 tablet (5 mg total)  by mouth 2 (two) times daily., Disp: 180 tablet, Rfl: 3   azithromycin (ZITHROMAX) 500 MG tablet, One pill daily for three days as needed for a COPD exacerbation, Disp: 3 tablet, Rfl: 5   bisoprolol (ZEBETA) 5 MG tablet, TAKE 1 TABLET DAILY, Disp: 90 tablet, Rfl: 3   cholecalciferol (VITAMIN D) 1000 units tablet, Take 1,000 Units by mouth daily., Disp: , Rfl:    guaiFENesin-codeine 100-10 MG/5ML syrup, TAKE 5 ML TWICE A DAY AS NEEDED FOR COUGH, Disp: 120 mL, Rfl: 2   hydrochlorothiazide (HYDRODIURIL) 25 MG tablet, Take 1 tablet (25 mg total) by mouth daily., Disp: 90 tablet, Rfl: 3   mometasone (ASMANEX) 220  MCG/INH inhaler, Inhale 2 puffs into the lungs at bedtime., Disp: , Rfl:    montelukast (SINGULAIR) 10 MG tablet, TAKE 1 TABLET AT BEDTIME (Patient taking differently: Take 10 mg by mouth daily after breakfast.), Disp: 90 tablet, Rfl: 1   Multiple Vitamins-Minerals (CENTRUM SILVER ADULT 50+ PO), Take 1 tablet by mouth daily. , Disp: , Rfl:    omeprazole (PRILOSEC) 40 MG capsule, TAKE 1 CAPSULE DAILY, Disp: 90 capsule, Rfl: 3   predniSONE (DELTASONE) 10 MG tablet, Take 10 mg by mouth daily with breakfast., Disp: , Rfl:    rosuvastatin (CRESTOR) 20 MG tablet, Take 1 tablet (20 mg total) by mouth daily., Disp: 90 tablet, Rfl: 3   tamsulosin (FLOMAX) 0.4 MG CAPS capsule, Take 2 capsules (0.8 mg total) by mouth daily after supper. (Patient taking differently: Take 0.8 mg by mouth 2 (two) times daily.), Disp: 180 capsule, Rfl: 3   Tiotropium Bromide-Olodaterol 2.5-2.5 MCG/ACT AERS, Inhale 2 puffs into the lungs daily., Disp: , Rfl:    traZODone (DESYREL) 100 MG tablet, TAKE 1 TABLET AT BEDTIME, Disp: 90 tablet, Rfl: 3   VENTOLIN HFA 108 (90 Base) MCG/ACT inhaler, USE 2 INHALATIONS EVERY 4 HOURS AS NEEDED. RESCUE FOR SHORTNESS OF BREATH, Disp: 54 g, Rfl: 1

## 2023-02-19 DIAGNOSIS — J449 Chronic obstructive pulmonary disease, unspecified: Secondary | ICD-10-CM | POA: Diagnosis not present

## 2023-03-22 ENCOUNTER — Other Ambulatory Visit: Payer: Self-pay

## 2023-03-22 DIAGNOSIS — N400 Enlarged prostate without lower urinary tract symptoms: Secondary | ICD-10-CM

## 2023-03-22 DIAGNOSIS — J449 Chronic obstructive pulmonary disease, unspecified: Secondary | ICD-10-CM | POA: Diagnosis not present

## 2023-03-22 DIAGNOSIS — N401 Enlarged prostate with lower urinary tract symptoms: Secondary | ICD-10-CM

## 2023-03-23 MED ORDER — TAMSULOSIN HCL 0.4 MG PO CAPS: 0.8000 mg | ORAL_CAPSULE | Freq: Every day | ORAL | 3 refills | Status: DC

## 2023-03-30 ENCOUNTER — Other Ambulatory Visit: Payer: Self-pay | Admitting: Pulmonary Disease

## 2023-04-11 ENCOUNTER — Other Ambulatory Visit: Payer: Self-pay

## 2023-04-11 DIAGNOSIS — I10 Essential (primary) hypertension: Secondary | ICD-10-CM

## 2023-04-11 MED ORDER — HYDROCHLOROTHIAZIDE 25 MG PO TABS
25.0000 mg | ORAL_TABLET | Freq: Every day | ORAL | 3 refills | Status: DC
Start: 2023-04-11 — End: 2023-05-20

## 2023-04-19 ENCOUNTER — Other Ambulatory Visit: Payer: Self-pay | Admitting: Pulmonary Disease

## 2023-04-19 DIAGNOSIS — J449 Chronic obstructive pulmonary disease, unspecified: Secondary | ICD-10-CM

## 2023-04-21 DIAGNOSIS — J449 Chronic obstructive pulmonary disease, unspecified: Secondary | ICD-10-CM | POA: Diagnosis not present

## 2023-05-03 DIAGNOSIS — J449 Chronic obstructive pulmonary disease, unspecified: Secondary | ICD-10-CM | POA: Diagnosis not present

## 2023-05-13 ENCOUNTER — Ambulatory Visit: Payer: Medicare PPO | Admitting: Family Medicine

## 2023-05-20 ENCOUNTER — Encounter: Payer: Self-pay | Admitting: Family Medicine

## 2023-05-20 ENCOUNTER — Ambulatory Visit (INDEPENDENT_AMBULATORY_CARE_PROVIDER_SITE_OTHER): Payer: Medicare PPO | Admitting: Family Medicine

## 2023-05-20 VITALS — BP 115/77 | HR 82 | Temp 97.7°F | Ht 71.0 in | Wt 158.0 lb

## 2023-05-20 DIAGNOSIS — I10 Essential (primary) hypertension: Secondary | ICD-10-CM

## 2023-05-20 DIAGNOSIS — Z23 Encounter for immunization: Secondary | ICD-10-CM

## 2023-05-20 DIAGNOSIS — J439 Emphysema, unspecified: Secondary | ICD-10-CM

## 2023-05-20 DIAGNOSIS — N1831 Chronic kidney disease, stage 3a: Secondary | ICD-10-CM | POA: Diagnosis not present

## 2023-05-20 DIAGNOSIS — N401 Enlarged prostate with lower urinary tract symptoms: Secondary | ICD-10-CM

## 2023-05-20 DIAGNOSIS — E1122 Type 2 diabetes mellitus with diabetic chronic kidney disease: Secondary | ICD-10-CM | POA: Diagnosis not present

## 2023-05-20 DIAGNOSIS — J441 Chronic obstructive pulmonary disease with (acute) exacerbation: Secondary | ICD-10-CM | POA: Diagnosis not present

## 2023-05-20 DIAGNOSIS — R972 Elevated prostate specific antigen [PSA]: Secondary | ICD-10-CM

## 2023-05-20 DIAGNOSIS — R3912 Poor urinary stream: Secondary | ICD-10-CM

## 2023-05-20 DIAGNOSIS — G4700 Insomnia, unspecified: Secondary | ICD-10-CM

## 2023-05-20 DIAGNOSIS — I480 Paroxysmal atrial fibrillation: Secondary | ICD-10-CM

## 2023-05-20 MED ORDER — HYDROCHLOROTHIAZIDE 25 MG PO TABS: 12.5000 mg | ORAL_TABLET | Freq: Every day | ORAL | Status: DC

## 2023-05-20 MED ORDER — GUAIFENESIN-CODEINE 100-10 MG/5ML PO SOLN: 5.0000 mL | Freq: Four times a day (QID) | ORAL | 1 refills | Status: DC | PRN

## 2023-05-20 NOTE — Progress Notes (Unsigned)
   SUBJECTIVE:   CHIEF COMPLAINT / HPI:   Hypertension, PAF, HLD: - Medications: bisoprolol, hydrochlorothiazide, eliquis, crestor - no blood in stool or urine - Compliance: good - Checking BP at home: yes, 120-140s at home - Denies any CP, vision changes, medication Ses - some lightheadedness when changing positions - minimal ankle edema  Emphysema/COPD - Medications: singulair, mometasone, stiolto, azithromycin three times weekly, albuterol prn. 3L O2 when walking, 2L when sitting, 2.5L at night. - His prior PCP Dr. [name] also prescribed him preemptive bursts of steroids (prednisone 50 mg daily for 5 days) and azithromycin 500 mg daily for 3 days, which patient keeps on hand in the event of an exacerbation.  - recent flare 6 days ago. Change of weather typically exacerbates.  - Compliance: hasn't had to use albuterol since recent prednisone use.  - Exacerbations in last 6 months: yes  Insomnia - since coming off prednisone. On trazodone. Also with nocturia x1 per night, often finds it difficult to go back to sleep once up.   Diabetes, Type 2 - Last A1c 7.3 11/2022 - Medications: diet controlled - previously on jardiance - Eye exam: UTD - Foot exam: due - Microalbumin: UTD - Statin: yes  Benign Prostatic Hypertrophy - Medications: flomax 0.8mg .  - Symptoms: nocturia one time a night and weak stream  - Denies: straining  - no personal history and no family history of prostate cancer   OBJECTIVE:   BP 115/77   Pulse 82   Temp 97.7 F (36.5 C)   Ht 5\' 11"  (1.803 m)   Wt 158 lb (71.7 kg)   SpO2 95%   BMI 22.04 kg/m   Gen: well appearing, in NAD Card: irregularly irregular rhythm Lungs: distant breath sounds, slight end expiratory wheeze. No rales or rhonchi Ext: WWP, trace ankle edema   ASSESSMENT/PLAN:   HYPERTENSION, BENIGN SYSTEMIC Low normal today and with orthostatic symptoms, also with decreased oral hydration to avoid nocturia contributing. Will  decrease hydrochlorothiazide, hopeful this will decrease some nocturia as well. Obtaining labs. F/u 1 month.   PAF (paroxysmal atrial fibrillation) (HCC) In afib today. Rate controlled. Tolerating eliquis without bleeds, continue.   COPD (chronic obstructive pulmonary disease) with emphysema (HCC) Severe. Coming off of acute exacerbation with only slight wheeze on exam and appears comfortable. Maintains sats on home O2. Follows with pulm.   DM (diabetes mellitus), type 2 with renal complications (HCC) A1c today.   CKD (chronic kidney disease) stage 3, GFR 30-59 ml/min (HCC) Encourage oral hydration. Recheck labs.  Benign prostatic hyperplasia with weak urinary stream Persistent nocturia and weak stream despite max dose flomax. Contributing to patient's oral water restriction. Pt hesitant to make med changes at this time. Recheck PSA. If remains symptomatic on f/u, recommend urological referral.  Insomnia Multifactorial. With nocturia, recent prednisone use contributing. Suspect will improve some with cutting back hydrochlorothiazide and now off prednisone. Continue trazodone. BPH plan as above.   F/u 1-2 months.  Caro Laroche, DO

## 2023-05-20 NOTE — Patient Instructions (Addendum)
It was great to see you!  Our plans for today:  - Cut your hydrochlorothiazide in half (to make 12.5mg ). - We sent refills of your cough syrup to the pharmacy. - We are checking some labs today, we will release these results to your MyChart. - Come back in 2 months.  Take care and seek immediate care sooner if you develop any concerns.   Dr. Linwood Dibbles

## 2023-05-21 LAB — BASIC METABOLIC PANEL
BUN/Creatinine Ratio: 17 (ref 10–24)
BUN: 24 mg/dL (ref 8–27)
CO2: 26 mmol/L (ref 20–29)
Calcium: 9.7 mg/dL (ref 8.6–10.2)
Chloride: 98 mmol/L (ref 96–106)
Creatinine, Ser: 1.45 mg/dL — ABNORMAL HIGH (ref 0.76–1.27)
Glucose: 128 mg/dL — ABNORMAL HIGH (ref 70–99)
Potassium: 3.9 mmol/L (ref 3.5–5.2)
Sodium: 140 mmol/L (ref 134–144)
eGFR: 49 mL/min/{1.73_m2} — ABNORMAL LOW (ref >59)

## 2023-05-21 LAB — CBC
Hematocrit: 36.5 % — ABNORMAL LOW (ref 37.5–51.0)
Hemoglobin: 10.7 g/dL — ABNORMAL LOW (ref 13.0–17.7)
MCH: 26.4 pg — ABNORMAL LOW (ref 26.6–33.0)
MCHC: 29.3 g/dL — ABNORMAL LOW (ref 31.5–35.7)
MCV: 90 fL (ref 79–97)
Platelets: 412 10*3/uL (ref 150–450)
RBC: 4.06 x10E6/uL — ABNORMAL LOW (ref 4.14–5.80)
RDW: 14.5 % (ref 11.6–15.4)
WBC: 17 10*3/uL — ABNORMAL HIGH (ref 3.4–10.8)

## 2023-05-21 LAB — PSA: Prostate Specific Ag, Serum: 4.8 ng/mL — ABNORMAL HIGH (ref 0.0–4.0)

## 2023-05-21 NOTE — Assessment & Plan Note (Signed)
Encourage oral hydration. Recheck labs.

## 2023-05-21 NOTE — Assessment & Plan Note (Signed)
A1c today.  

## 2023-05-21 NOTE — Assessment & Plan Note (Addendum)
Persistent nocturia and weak stream despite max dose flomax. Contributing to patient's oral water restriction. Pt hesitant to make med changes at this time. Recheck PSA. If remains symptomatic on f/u, recommend urological referral.

## 2023-05-21 NOTE — Assessment & Plan Note (Addendum)
Low normal today and with orthostatic symptoms, also with decreased oral hydration to avoid nocturia contributing. Will decrease hydrochlorothiazide, hopeful this will decrease some nocturia as well. Obtaining labs. F/u 1 month.

## 2023-05-21 NOTE — Assessment & Plan Note (Signed)
Multifactorial. With nocturia, recent prednisone use contributing. Suspect will improve some with cutting back hydrochlorothiazide and now off prednisone. Continue trazodone. BPH plan as above.

## 2023-05-21 NOTE — Assessment & Plan Note (Addendum)
Severe. Coming off of acute exacerbation with only slight wheeze on exam and appears comfortable. Maintains sats on home O2. Follows with pulm.

## 2023-05-21 NOTE — Assessment & Plan Note (Signed)
In afib today. Rate controlled. Tolerating eliquis without bleeds, continue.

## 2023-05-22 DIAGNOSIS — J449 Chronic obstructive pulmonary disease, unspecified: Secondary | ICD-10-CM | POA: Diagnosis not present

## 2023-05-24 LAB — HEMOGLOBIN A1C
Est. average glucose Bld gHb Est-mCnc: 163 mg/dL
Hgb A1c MFr Bld: 7.3 % — ABNORMAL HIGH (ref 4.8–5.6)

## 2023-05-24 LAB — SPECIMEN STATUS REPORT

## 2023-05-28 LAB — LAB REPORT - SCANNED: Microalb Creat Ratio: 11

## 2023-06-21 DIAGNOSIS — J449 Chronic obstructive pulmonary disease, unspecified: Secondary | ICD-10-CM | POA: Diagnosis not present

## 2023-07-14 ENCOUNTER — Other Ambulatory Visit: Payer: Self-pay

## 2023-07-14 DIAGNOSIS — K219 Gastro-esophageal reflux disease without esophagitis: Secondary | ICD-10-CM

## 2023-07-14 MED ORDER — OMEPRAZOLE 40 MG PO CPDR
40.0000 mg | DELAYED_RELEASE_CAPSULE | Freq: Every day | ORAL | 3 refills | Status: AC
Start: 2023-07-14 — End: ?

## 2023-07-22 DIAGNOSIS — J449 Chronic obstructive pulmonary disease, unspecified: Secondary | ICD-10-CM | POA: Diagnosis not present

## 2023-07-25 ENCOUNTER — Ambulatory Visit: Payer: Medicare PPO | Admitting: Family Medicine

## 2023-07-25 NOTE — Progress Notes (Deleted)
   SUBJECTIVE:   CHIEF COMPLAINT / HPI:   Hypertension: - Medications: bisoprolol , hydrochlorothiazide  (decreased at last visit) - Compliance: *** - Checking BP at home: *** - Denies any SOB, CP, vision changes, LE edema, medication SEs, or symptoms of hypotension - Diet: *** - Exercise: ***  Benign Prostatic Hypertrophy - Medications: flomax  - Symptoms: {sx:10412} {complic:10414} - Denies: {sx:10412} {complic:10414} - {Psa family [id number]} of prostate cancer - AUA Symptom Score is, {0-35:17906}/{0-35:17906} - elevated PSA at last check, referred to urology   On prednisone ?***  OBJECTIVE:   There were no vitals taken for this visit.  ***  ASSESSMENT/PLAN:   No problem-specific Assessment & Plan notes found for this encounter.     Donald CHRISTELLA Lai, DO

## 2023-08-01 ENCOUNTER — Encounter: Payer: Self-pay | Admitting: Family Medicine

## 2023-08-01 ENCOUNTER — Ambulatory Visit (INDEPENDENT_AMBULATORY_CARE_PROVIDER_SITE_OTHER): Payer: Medicare PPO | Admitting: Family Medicine

## 2023-08-01 VITALS — BP 110/70 | HR 84 | Wt 154.8 lb

## 2023-08-01 DIAGNOSIS — N401 Enlarged prostate with lower urinary tract symptoms: Secondary | ICD-10-CM | POA: Diagnosis not present

## 2023-08-01 DIAGNOSIS — I10 Essential (primary) hypertension: Secondary | ICD-10-CM | POA: Diagnosis not present

## 2023-08-01 DIAGNOSIS — R3912 Poor urinary stream: Secondary | ICD-10-CM

## 2023-08-01 DIAGNOSIS — J439 Emphysema, unspecified: Secondary | ICD-10-CM

## 2023-08-01 DIAGNOSIS — E1122 Type 2 diabetes mellitus with diabetic chronic kidney disease: Secondary | ICD-10-CM | POA: Diagnosis not present

## 2023-08-01 DIAGNOSIS — J441 Chronic obstructive pulmonary disease with (acute) exacerbation: Secondary | ICD-10-CM | POA: Diagnosis not present

## 2023-08-01 DIAGNOSIS — J9611 Chronic respiratory failure with hypoxia: Secondary | ICD-10-CM

## 2023-08-01 DIAGNOSIS — I480 Paroxysmal atrial fibrillation: Secondary | ICD-10-CM | POA: Diagnosis not present

## 2023-08-01 DIAGNOSIS — N1831 Chronic kidney disease, stage 3a: Secondary | ICD-10-CM

## 2023-08-01 NOTE — Assessment & Plan Note (Signed)
 In afib. Rate controlled. Follows with [facility]. On eliquis.

## 2023-08-01 NOTE — Progress Notes (Signed)
    SUBJECTIVE:   CHIEF COMPLAINT / HPI:   Hypertension, PAF: - Medications: bisoprolol , hydrochlorothiazide  (decreased at last visit) -  follows with [facility] - Compliance: good - Checking BP at home: yes, 120s SBP - Denies any LE edema, medication SEs - some lightheadedness when in Afib and with positional changes.   Benign Prostatic Hypertrophy - Medications: flomax  - Symptoms: nocturia one time a night  - elevated PSA at last check, referred to urology. Has appt 1/30.  COPD - on daily prednisone . Just completed burst due to recent flare.    OBJECTIVE:   BP 110/70   Pulse 84   Wt 154 lb 12.8 oz (70.2 kg)   SpO2 95%   BMI 21.59 kg/m   Gen: well appearing, in NAD Card: RRR Lungs: distant breath sounds. No wheezing appreciated. Comfortable WOB on home supplemental O2.  Ext: WWP, no edema   ASSESSMENT/PLAN:   PAF (paroxysmal atrial fibrillation) (HCC) In afib. Rate controlled. Follows with [facility]. On eliquis .  HYPERTENSION, BENIGN SYSTEMIC Low normal with orthostatic sx, discontinue hydrochlorothiazide . Obtaining labs. F/u 1 month.  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 With recent exacerbation however doing well with no appreciable wheezing on exam. Follows with pulm. Continue current management.  Benign prostatic hyperplasia with weak urinary stream And with new elevated PSA. Await upcoming urology appt.   F/u 1 month for HTN.  Donald CHRISTELLA Lai, DO

## 2023-08-01 NOTE — Assessment & Plan Note (Signed)
 Chronic. Severe. With recent exacerbation however doing well with no appreciable wheezing on exam. Follows with pulm. Continue current management.

## 2023-08-01 NOTE — Assessment & Plan Note (Signed)
 Low normal with orthostatic sx, discontinue hydrochlorothiazide. Obtaining labs. F/u 1 month.

## 2023-08-01 NOTE — Patient Instructions (Signed)
 It was great to see you!  Our plans for today:  - STOP hydrochlorothiazide  - Monitor your blood pressure at home and keep a log of your readings. Make sure to be seated for at least 5 minutes prior to testing and not in pain or worked up for the most accurate readings. Bring this log with you to follow up.   We are checking some labs today, we will release these results to your MyChart.  Take care and seek immediate care sooner if you develop any concerns.   Dr. Aurea Aronov

## 2023-08-01 NOTE — Assessment & Plan Note (Signed)
 And with new elevated PSA. Await upcoming urology appt.

## 2023-08-02 LAB — BASIC METABOLIC PANEL
BUN/Creatinine Ratio: 19 (ref 10–24)
BUN: 26 mg/dL (ref 8–27)
CO2: 24 mmol/L (ref 20–29)
Calcium: 9.2 mg/dL (ref 8.6–10.2)
Chloride: 101 mmol/L (ref 96–106)
Creatinine, Ser: 1.36 mg/dL — ABNORMAL HIGH (ref 0.76–1.27)
Glucose: 147 mg/dL — ABNORMAL HIGH (ref 70–99)
Potassium: 4.1 mmol/L (ref 3.5–5.2)
Sodium: 142 mmol/L (ref 134–144)
eGFR: 53 mL/min/{1.73_m2} — ABNORMAL LOW (ref >59)

## 2023-08-19 DIAGNOSIS — R972 Elevated prostate specific antigen [PSA]: Secondary | ICD-10-CM | POA: Diagnosis not present

## 2023-08-19 DIAGNOSIS — R3912 Poor urinary stream: Secondary | ICD-10-CM | POA: Diagnosis not present

## 2023-08-19 DIAGNOSIS — N401 Enlarged prostate with lower urinary tract symptoms: Secondary | ICD-10-CM | POA: Diagnosis not present

## 2023-08-22 DIAGNOSIS — J449 Chronic obstructive pulmonary disease, unspecified: Secondary | ICD-10-CM | POA: Diagnosis not present

## 2023-08-30 ENCOUNTER — Ambulatory Visit: Payer: Medicare PPO | Admitting: Family Medicine

## 2023-09-02 ENCOUNTER — Other Ambulatory Visit: Payer: Self-pay

## 2023-09-02 DIAGNOSIS — J441 Chronic obstructive pulmonary disease with (acute) exacerbation: Secondary | ICD-10-CM

## 2023-09-02 DIAGNOSIS — J439 Emphysema, unspecified: Secondary | ICD-10-CM

## 2023-09-02 MED ORDER — AZITHROMYCIN 500 MG PO TABS: ORAL_TABLET | ORAL | 5 refills | Status: DC

## 2023-09-08 ENCOUNTER — Telehealth: Payer: Medicare PPO | Admitting: Family Medicine

## 2023-09-08 ENCOUNTER — Encounter: Payer: Self-pay | Admitting: Family Medicine

## 2023-09-08 DIAGNOSIS — I480 Paroxysmal atrial fibrillation: Secondary | ICD-10-CM | POA: Diagnosis not present

## 2023-09-08 DIAGNOSIS — R3912 Poor urinary stream: Secondary | ICD-10-CM

## 2023-09-08 DIAGNOSIS — N401 Enlarged prostate with lower urinary tract symptoms: Secondary | ICD-10-CM

## 2023-09-08 DIAGNOSIS — I10 Essential (primary) hypertension: Secondary | ICD-10-CM | POA: Diagnosis not present

## 2023-09-08 MED ORDER — APIXABAN 5 MG PO TABS: 5.0000 mg | ORAL_TABLET | Freq: Two times a day (BID) | ORAL | 0 refills | Status: AC

## 2023-09-08 MED ORDER — FINASTERIDE 5 MG PO TABS: 5.0000 mg | ORAL_TABLET | Freq: Every day | ORAL | 0 refills | Status: DC

## 2023-09-08 NOTE — Assessment & Plan Note (Signed)
Doing well on current regimen, no changes made today. Losartan added to med list. Advised to take time with positional changes. F/u 1-2 months for in office recheck and labs.

## 2023-09-08 NOTE — Patient Instructions (Signed)
It was great to see you!  Our plans for today:  - START finasteride for your weak stream. Continue taking your tamsulosin. - No changes to your blood pressure medications.  Take care and seek immediate care sooner if you develop any concerns.   Dr. Linwood Dibbles

## 2023-09-08 NOTE — Progress Notes (Signed)
Virtual Visit via Video Note  I connected with Michael Robertson. on 09/08/23 at 11:10 AM EST by a video enabled telemedicine application and verified that I am speaking with the correct person using two identifiers.  Location: Patient: home Provider: home   I discussed the limitations of evaluation and management by telemedicine and the availability of in person appointments. The patient expressed understanding and agreed to proceed.  History of Present Illness:  BPH - Went to urology for elevated PSA. DRE unremarkable at that time. Was told likely age-related. F/u prn. Still with weak stream and getting up a few times per night. On flomax 0.8mg . wants to try something additional for it.  HTN, PAF - Has been checking BP at home. No longer on hydrochlorothiazide. Still on bisoprolol. Also reports he is on losartan with cardiology, not previously on our med list. BP usually in the 150s systolic. Occasionally in the morning 130s systolic. 132/59 this am. Some dizziness in the mornings with walking but resolves once he starts drinking his coffee. Hasn't fallen. Still taking eliquis, needs refill. Normally gets from mail order pharmacy via the [place] but is set to run out on Saturday.  Severe COPD - doing well. Feels his breathing is better since last visit.   Observations/Objective:  Well appearing, in NAD. Speaks in full sentences. Comfortable WOB on RA. No resp distress.    Assessment and Plan:  Problem List Items Addressed This Visit       Cardiovascular and Mediastinum   HYPERTENSION, BENIGN SYSTEMIC - Primary (Chronic)   Doing well on current regimen, no changes made today. Losartan added to med list. Advised to take time with positional changes. F/u 1-2 months for in office recheck and labs.      Relevant Medications   losartan (COZAAR) 100 MG tablet   apixaban (ELIQUIS) 5 MG TABS tablet   PAF (paroxysmal atrial fibrillation) (HCC)   Relevant Medications   losartan (COZAAR) 100 MG  tablet   apixaban (ELIQUIS) 5 MG TABS tablet     Genitourinary   Benign prostatic hyperplasia with weak urinary stream   Still with LUTS despite increase in flomax, add finasteride. F/u 1 month.      Relevant Medications   finasteride (PROSCAR) 5 MG tablet      I discussed the assessment and treatment plan with the patient. The patient was provided an opportunity to ask questions and all were answered. The patient agreed with the plan and demonstrated an understanding of the instructions.   The patient was advised to call back or seek an in-person evaluation if the symptoms worsen or if the condition fails to improve as anticipated.  I provided 16 minutes of non-face-to-face time during this encounter.   Caro Laroche, DO

## 2023-09-08 NOTE — Assessment & Plan Note (Addendum)
Still with LUTS despite increase in flomax, add finasteride. F/u 1 month.

## 2023-09-13 IMAGING — CT CT CHEST W/O CM
1 series · 15 of 34 positions shown, 19 images · non-contrast
Comparison: September 10, 2010.

CLINICAL DATA: Lung nodule.

EXAM:
CT CHEST WITHOUT CONTRAST
TECHNIQUE: Multidetector CT imaging of the chest was performed following the
standard protocol without IV contrast.

[Series 2: chest w/(date) · axial · 0.65mm/px · z∈[-334,+12]mm · 15 of 205 slices shown, 19 images]
[im 16/205  mediastinal]
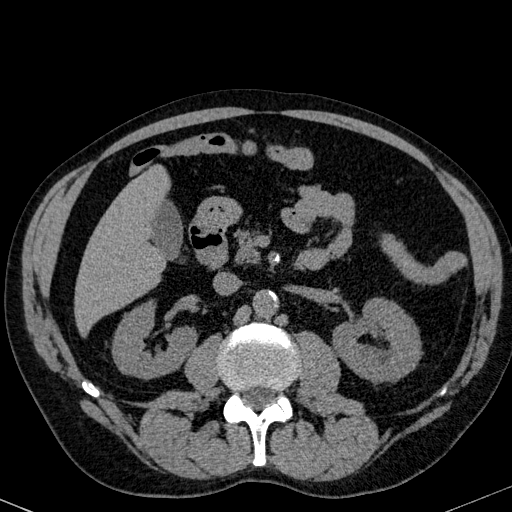
[im 16/205  lung]
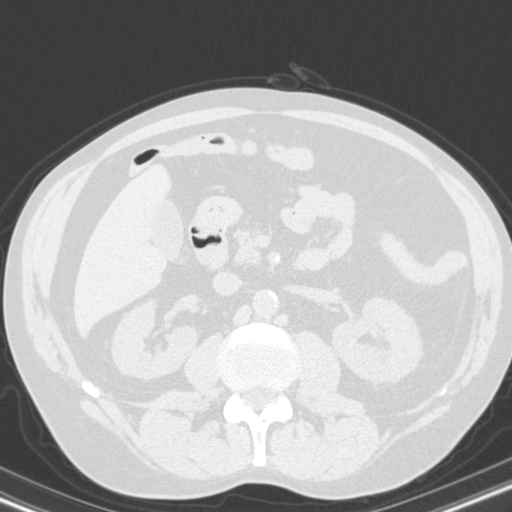
[im 31/205  lung]
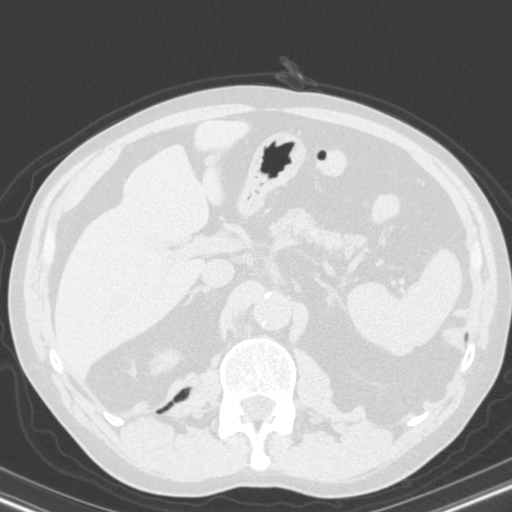
[im 41/205  lung]
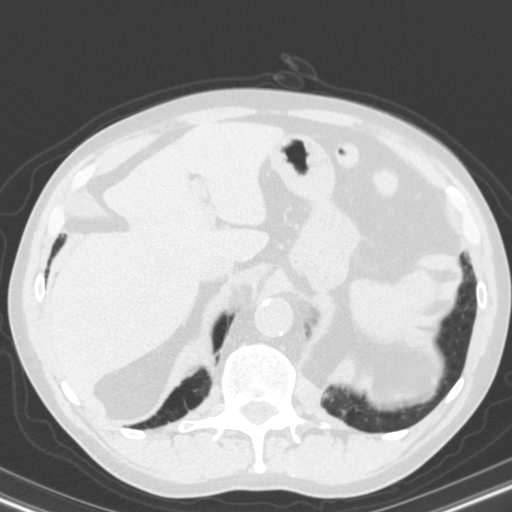
[im 53/205  lung]
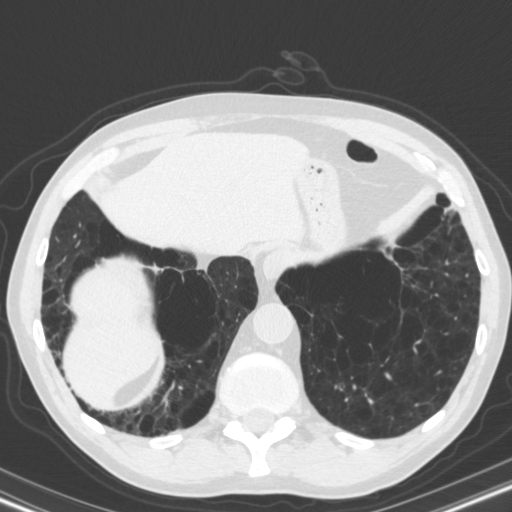
[im 69/205  mediastinal]
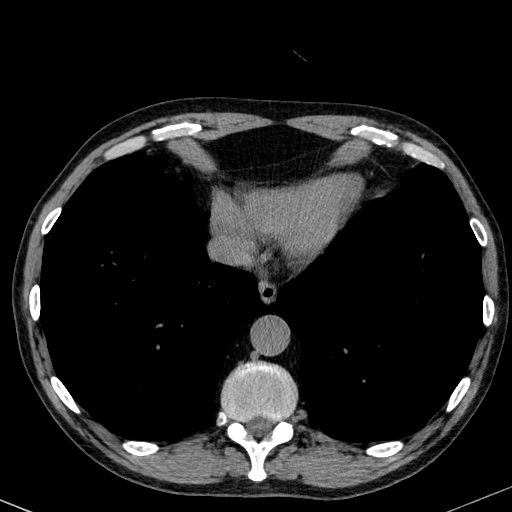
[im 69/205  lung]
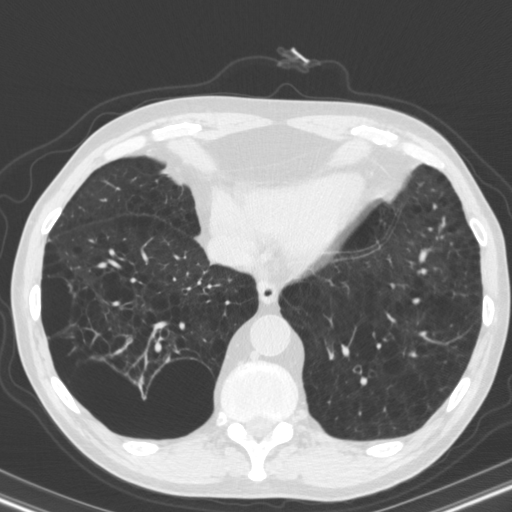
[im 82/205  lung]
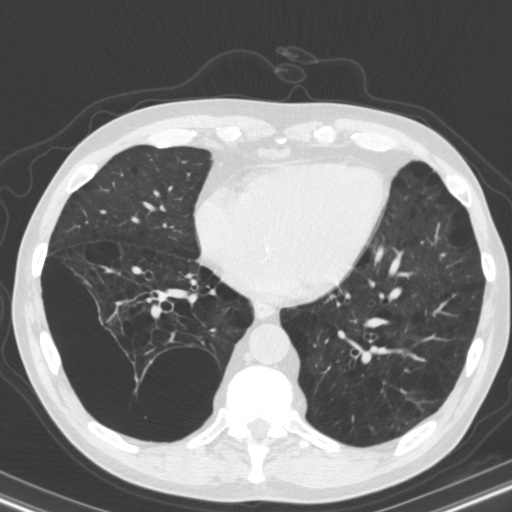
[im 91/205  lung]
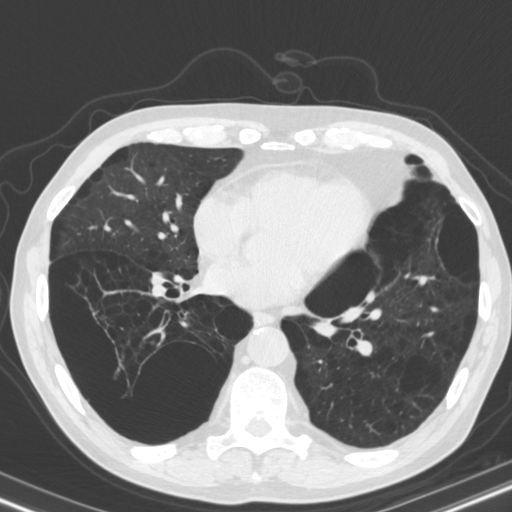
[im 106/205  lung]
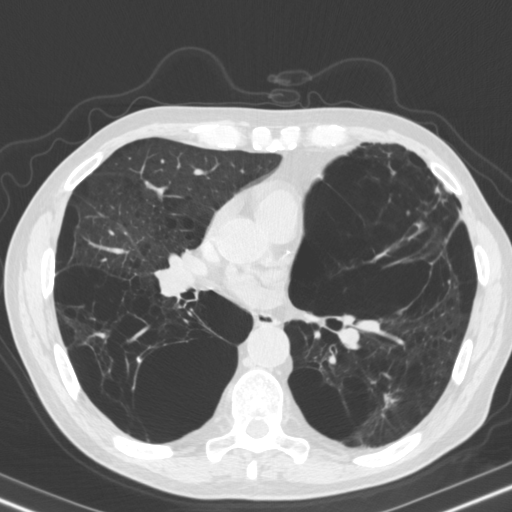
[im 114/205  mediastinal]
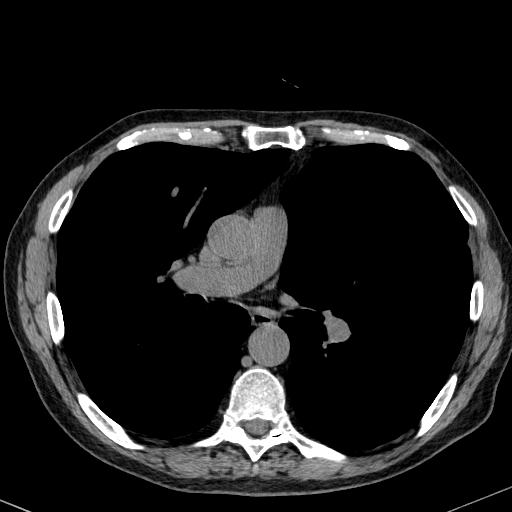
[im 114/205  lung]
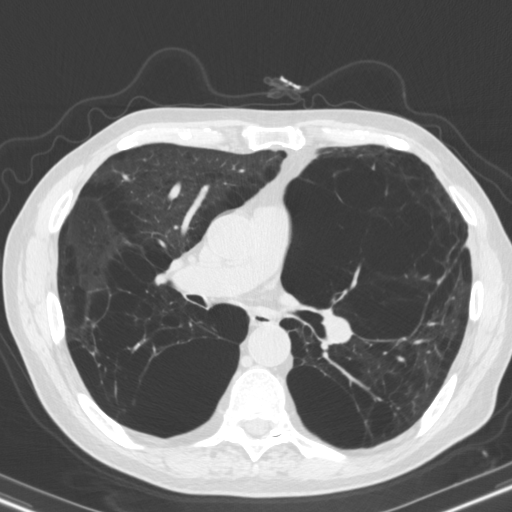
[im 123/205  lung]
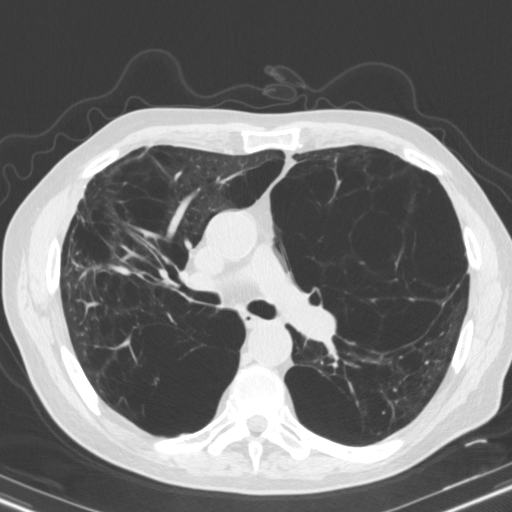
[im 137/205  lung]
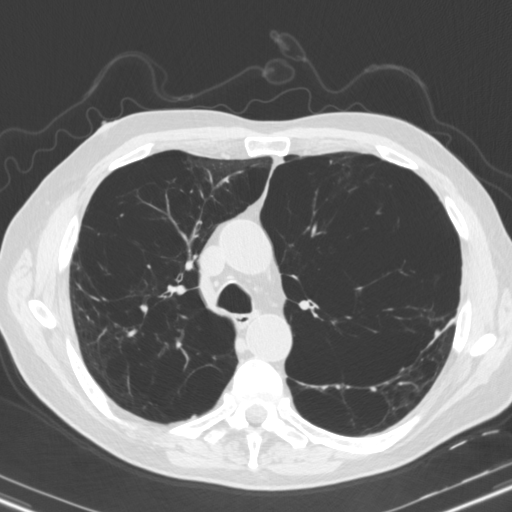
[im 152/205  lung]
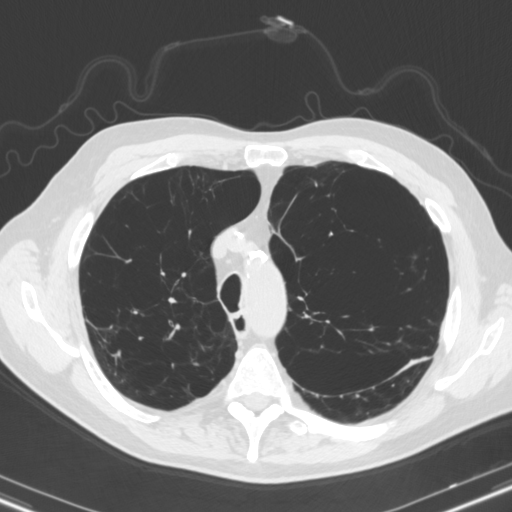
[im 164/205  mediastinal]
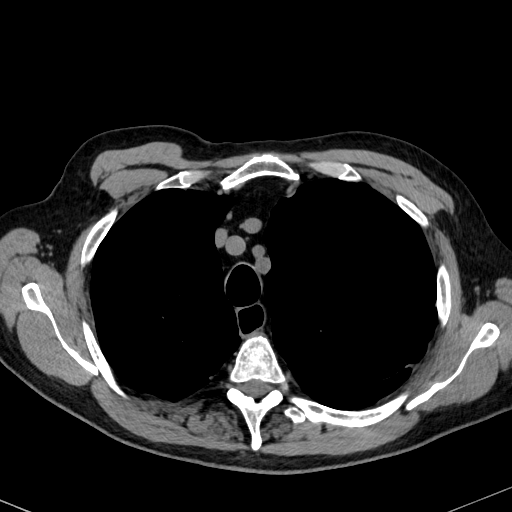
[im 164/205  lung]
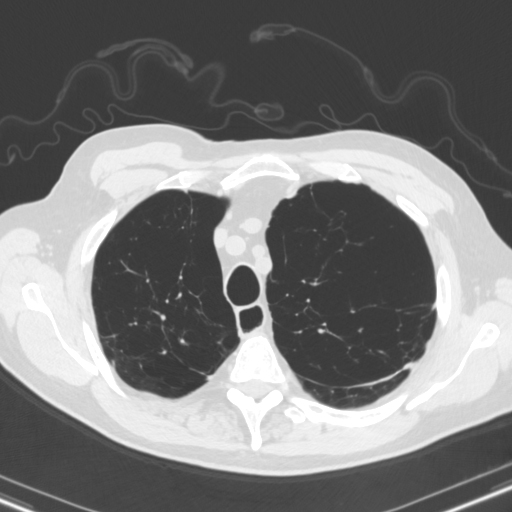
[im 174/205  lung]
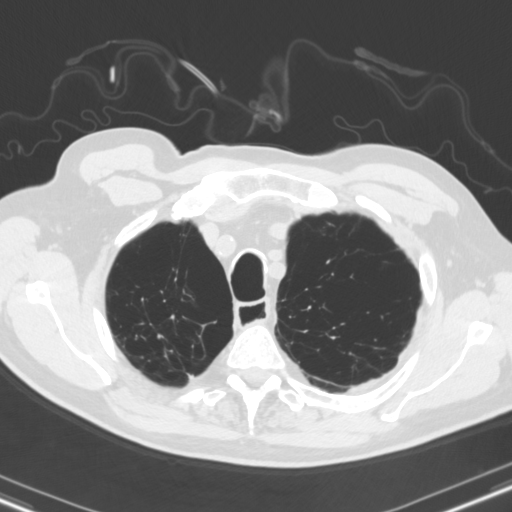
[im 189/205  lung]
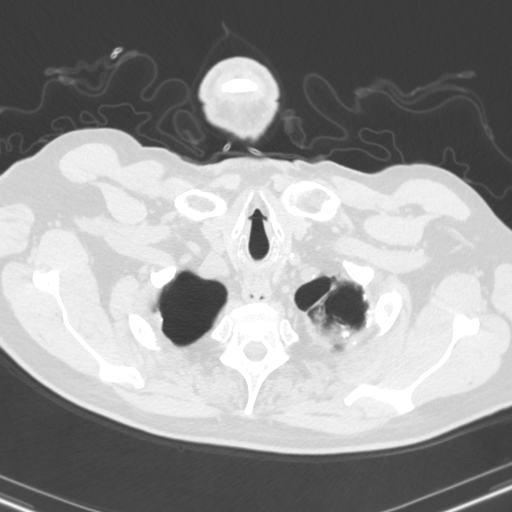

[15 of 34 positions shown; findings below may reference images not displayed]

FINDINGS: Cardiovascular: Atherosclerosis of thoracic aorta is noted without
aneurysm formation. Normal cardiac size. No pericardial effusion.
Mild coronary artery calcifications are noted.

Mediastinum/Nodes: No enlarged mediastinal or axillary lymph nodes.
Thyroid gland, trachea, and esophagus demonstrate no significant
findings.

Lungs/Pleura: No pneumothorax or pleural effusion is noted. Severe
emphysematous disease is noted throughout both lungs. 8 mm irregular
nodular density is noted anteriorly in the right upper lobe adjacent
to minor fissure lobe best seen on image number 87 of series 3. No
consolidative process is noted.

Upper Abdomen: No acute abnormality.

Musculoskeletal: No chest wall mass or suspicious bone lesions
identified.
IMPRESSION: 8 mm irregular nodular density is noted anteriorly in the right
upper lobe. Non-contrast chest CT at 6-12 months is recommended. If
the nodule is stable at time of repeat CT, then future CT at 18-24
months (from today's scan) is considered optional for low-risk
patients, but is recommended for high-risk patients. This
recommendation follows the consensus statement: Guidelines for
Management of Incidental Pulmonary Nodules Detected on CT Images:

Mild coronary artery calcifications are noted.

Aortic Atherosclerosis (PMSVV-2BY.Y) and Emphysema ([id number].9).

## 2023-09-19 DIAGNOSIS — J449 Chronic obstructive pulmonary disease, unspecified: Secondary | ICD-10-CM | POA: Diagnosis not present

## 2023-10-20 DIAGNOSIS — J449 Chronic obstructive pulmonary disease, unspecified: Secondary | ICD-10-CM | POA: Diagnosis not present

## 2023-10-28 ENCOUNTER — Ambulatory Visit: Payer: Medicare PPO | Admitting: Family Medicine

## 2023-10-28 ENCOUNTER — Encounter: Payer: Self-pay | Admitting: Family Medicine

## 2023-10-28 VITALS — BP 150/74 | HR 80 | Ht 71.0 in | Wt 149.6 lb

## 2023-10-28 DIAGNOSIS — D649 Anemia, unspecified: Secondary | ICD-10-CM

## 2023-10-28 DIAGNOSIS — E1122 Type 2 diabetes mellitus with diabetic chronic kidney disease: Secondary | ICD-10-CM

## 2023-10-28 DIAGNOSIS — I10 Essential (primary) hypertension: Secondary | ICD-10-CM

## 2023-10-28 DIAGNOSIS — N1831 Chronic kidney disease, stage 3a: Secondary | ICD-10-CM | POA: Diagnosis not present

## 2023-10-28 DIAGNOSIS — D509 Iron deficiency anemia, unspecified: Secondary | ICD-10-CM | POA: Diagnosis not present

## 2023-10-28 DIAGNOSIS — N401 Enlarged prostate with lower urinary tract symptoms: Secondary | ICD-10-CM

## 2023-10-28 DIAGNOSIS — R3912 Poor urinary stream: Secondary | ICD-10-CM | POA: Diagnosis not present

## 2023-10-28 DIAGNOSIS — I480 Paroxysmal atrial fibrillation: Secondary | ICD-10-CM

## 2023-10-28 LAB — POCT GLYCOSYLATED HEMOGLOBIN (HGB A1C): HbA1c, POC (controlled diabetic range): 6.6 % (ref 0.0–7.0)

## 2023-10-28 MED ORDER — HYDROCHLOROTHIAZIDE 12.5 MG PO TABS: 12.5000 mg | ORAL_TABLET | Freq: Every day | ORAL | 1 refills | Status: DC

## 2023-10-28 NOTE — Assessment & Plan Note (Signed)
 Elevated today and on recheck but hasn't taken meds yet today. Normal home readings. Will add back hydrochlorothiazide given currently taking. F/u 6 months or sooner if home readings elevated or labs abnormal.

## 2023-10-28 NOTE — Patient Instructions (Signed)
 It was great to see you!  Our plans for today:  - No changes to your medications. I sent a refill of your hydrochlorothiazide to your pharmacy.  - We are checking some labs today, we will release these results to your MyChart. - Come back in about 6 months, sooner if you have concerns or if there any concerns in your labwork.   Take care and seek immediate care sooner if you develop any concerns.   Dr. Linwood Dibbles

## 2023-10-28 NOTE — Assessment & Plan Note (Signed)
 Recheck A1c

## 2023-10-28 NOTE — Assessment & Plan Note (Signed)
Doing well, no changes 

## 2023-10-28 NOTE — Progress Notes (Signed)
   SUBJECTIVE:   CHIEF COMPLAINT / HPI:   Hypertension, PAF, HLD: - Medications: eliquis, losartan, bisoprolol. (Discontinued hydrochlorothiazide at last visit due to orthostatic hypoTN) - Compliance: started back taking 1/2 pill of hydrochlorothiazide, noticed feet was swelling. Has not yet taken today.  - Checking BP at home: yes, 135/71 this morning. Usually 125-135 range. - Denies any CP, vision changes, medication SEs, or symptoms of hypotension - no bleeding  Diabetes, Type 2 - Last A1c 7.3 05/2023 - Medications: diet controlled - previously on jardiance - Eye exam: UTD - Foot exam: due - Microalbumin: UTD - Statin: yes  Benign Prostatic Hypertrophy - Medications: tamsulosin, finasteride - Symptoms: doing well - Denies: incomplete emptying and weak stream  - last PSA within normal limits for age.   OBJECTIVE:   BP (!) 150/74   Pulse 80   Ht 5\' 11"  (1.803 m)   Wt 149 lb 9.6 oz (67.9 kg)   SpO2 91%   BMI 20.86 kg/m   Gen: well appearing, in NAD Card: RRR Lungs: distant breath sounds. No wheezing, rhonchi, crackles appreciated. Ext: WWP, no edema   ASSESSMENT/PLAN:   HYPERTENSION, BENIGN SYSTEMIC Elevated today and on recheck but hasn't taken meds yet today. Normal home readings. Will add back hydrochlorothiazide given currently taking. F/u 6 months or sooner if home readings elevated or labs abnormal.   DM (diabetes mellitus), type 2 with renal complications (HCC) Recheck A1c.  Benign prostatic hyperplasia with weak urinary stream Doing well, no changes.  PAF (paroxysmal atrial fibrillation) (HCC) RRR today. Follows with [facility]. On eliquis without bleeding, continue.     Caro Laroche, DO

## 2023-10-28 NOTE — Assessment & Plan Note (Signed)
 RRR today. Follows with [facility]. On eliquis without bleeding, continue.

## 2023-10-29 LAB — CBC
Hematocrit: 36.8 % — ABNORMAL LOW (ref 37.5–51.0)
Hemoglobin: 10.8 g/dL — ABNORMAL LOW (ref 13.0–17.7)
MCH: 26.2 pg — ABNORMAL LOW (ref 26.6–33.0)
MCHC: 29.3 g/dL — ABNORMAL LOW (ref 31.5–35.7)
MCV: 89 fL (ref 79–97)
Platelets: 327 10*3/uL (ref 150–450)
RBC: 4.12 x10E6/uL — ABNORMAL LOW (ref 4.14–5.80)
RDW: 14.4 % (ref 11.6–15.4)
WBC: 10.4 10*3/uL (ref 3.4–10.8)

## 2023-10-29 LAB — BASIC METABOLIC PANEL WITH GFR
BUN/Creatinine Ratio: 17 (ref 10–24)
BUN: 19 mg/dL (ref 8–27)
CO2: 26 mmol/L (ref 20–29)
Calcium: 9.2 mg/dL (ref 8.6–10.2)
Chloride: 105 mmol/L (ref 96–106)
Creatinine, Ser: 1.14 mg/dL (ref 0.76–1.27)
Glucose: 117 mg/dL — ABNORMAL HIGH (ref 70–99)
Potassium: 4.1 mmol/L (ref 3.5–5.2)
Sodium: 149 mmol/L — ABNORMAL HIGH (ref 134–144)
eGFR: 65 mL/min/{1.73_m2} (ref >59)

## 2023-10-31 NOTE — Addendum Note (Signed)
 Addended by: Kandis Ormond on: 10/31/2023 02:30 PM   Modules accepted: Orders

## 2023-11-10 ENCOUNTER — Encounter: Payer: Self-pay | Admitting: Pulmonary Disease

## 2023-11-10 ENCOUNTER — Ambulatory Visit: Payer: Medicare PPO | Admitting: Pulmonary Disease

## 2023-11-10 VITALS — BP 115/70 | HR 95 | Ht 71.0 in | Wt 150.0 lb

## 2023-11-10 DIAGNOSIS — J439 Emphysema, unspecified: Secondary | ICD-10-CM

## 2023-11-10 DIAGNOSIS — Z9981 Dependence on supplemental oxygen: Secondary | ICD-10-CM | POA: Diagnosis not present

## 2023-11-10 DIAGNOSIS — Z87891 Personal history of nicotine dependence: Secondary | ICD-10-CM

## 2023-11-10 DIAGNOSIS — J9611 Chronic respiratory failure with hypoxia: Secondary | ICD-10-CM | POA: Diagnosis not present

## 2023-11-10 DIAGNOSIS — Z7722 Contact with and (suspected) exposure to environmental tobacco smoke (acute) (chronic): Secondary | ICD-10-CM | POA: Diagnosis not present

## 2023-11-10 MED ORDER — MOMETASONE FUROATE 220 MCG/ACT IN AEPB: 2.0000 | INHALATION_SPRAY | Freq: Every day | RESPIRATORY_TRACT | 3 refills | Status: AC

## 2023-11-10 MED ORDER — PREDNISONE 10 MG PO TABS
10.0000 mg | ORAL_TABLET | Freq: Every day | ORAL | 3 refills | Status: AC
Start: 2023-11-10 — End: ?

## 2023-11-10 MED ORDER — AZITHROMYCIN 500 MG PO TABS
500.0000 mg | ORAL_TABLET | ORAL | 3 refills | Status: AC
Start: 2023-11-11 — End: ?

## 2023-11-10 MED ORDER — TIOTROPIUM BROMIDE-OLODATEROL 2.5-2.5 MCG/ACT IN AERS: 2.0000 | INHALATION_SPRAY | Freq: Every day | RESPIRATORY_TRACT | 3 refills | Status: DC

## 2023-11-10 NOTE — Patient Instructions (Signed)
Continue to use asmanex inhaler 2 puffs at bedtime   Continue stiolto inhaler 2 puffs daily   Continue montelukast 10mg  daily for sinus congestion and your breathing   Continue azithromycin 3 days per week   Continue albuterol inhaler as needed..    Follow up in 6 months

## 2023-11-10 NOTE — Progress Notes (Signed)
 Synopsis: Referred in November 2022 for COPD/Resp failure, previously followed at [facility] clinic  Subjective:   PATIENT ID: Michael Robertson. 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   He recently ran out of medications and would like refills sent to [facility] pharmacy. Overall his breathing has been stable since last visit. He denies any exacerbations.   OV 02/11/2023 He reports 1 flare in his breathing since last visit, otherwise has been doing ok.   OV 07/20/22 His breathing has been stable since last visit. He continues on the same regimen as before. He continues to perform all ADLs independently.   OV 12/16/21 He is using 3-4L of O2. He reports his wife passed from liver cancer in 09-20-2023 and he had let himself go for a bit where he had a harder time breathing. He was treated with levaquin  which helped his breathing.   His step son has moved in with him.   He continues on asmanex  nightly and stiolto daily along with prednisone  10mg  daily and azithromycin  3 days per week.   He reports his breathing is roughly at baseline but overall very short of breath.  OV 05/25/21 He is currently on ICS/LAMA/LABA inhaler therapy, 10mg  prednisone  daily, azithromycin  3 days per week and supplemental oxygen  2L at rest and 3L with exertion.  He is more short of breath over the last six months due to a covid infection in June and then another viral infection last month. He notices wheezing at night. He also has increased mucous production over the past couple of months.   Hot humid weather and allergies in the beginning of summer bother his breathing.   He is using flonase  for sinus congestion and post nasal drainage as needed with good efficacy.    He has lost 10lbs in the last  few months. He is more active due to helping his wife around the home more as she is dealing with a liver cancer diagnosis.   He has serial CT Chest scans to follow a pulmonary nodule which has been stable over the past year.   Past Medical History:  Diagnosis Date   Chronic kidney disease    COPD (chronic obstructive pulmonary disease) (HCC)    COPD exacerbation (HCC) 05/07/2010   Qualifier: Diagnosis of   By: Bronson Canny MD, Sammie Crigler         GERD (gastroesophageal reflux disease)    Hyperlipidemia    Hypertension    Insomnia    Oxygen  deficiency    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     Family History  Problem Relation Age of Onset   Stroke Mother    Dementia Mother    Diabetes Mother    Cancer Father        colon   Stroke Father    Alzheimer's disease Sister    Diabetes Sister    Dementia Sister    Obesity Daughter    Diabetes Daughter    Hypertension Daughter    Stroke Son      Social History   Socioeconomic History   Marital status: Widowed    Spouse name: Benancio Bracket- passed away 09-19-2021   Number of children: 4   Years of education: 40- some college   Highest education level:  Some college, no degree  Occupational History   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    His wife recent passed away in 25-Sep-2021.   Patient does drive occasionally. His son does must of his shopping.     Patient is on home oxygen . Patient uses a scooter when out of his home.    Social Drivers of Corporate investment banker Strain: Low Risk  (07/30/2023)   Overall Financial Resource Strain (CARDIA)    Difficulty of Paying Living Expenses: Not hard at all  Food Insecurity: No Food Insecurity (07/30/2023)   Hunger Vital Sign    Worried About Running Out of Food in the Last Year: Never true    Ran Out of Food in the Last Year: Never true  Transportation Needs: No Transportation Needs (07/30/2023)   PRAPARE - Administrator, Civil Service (Medical): No    Lack of Transportation (Non-Medical): No  Physical Activity: Inactive (07/30/2023)   Exercise Vital Sign    Days of Exercise per Week: 0 days    Minutes of Exercise per Session: 20 min  Stress: Stress Concern Present (07/30/2023)   [employer] of Occupational Health - Occupational Stress Questionnaire    Feeling of Stress : To some extent  Social Connections: Moderately Isolated (07/30/2023)   Social Connection and Isolation Panel [NHANES]    Frequency of Communication with Friends and Family: More than three times a week    Frequency of Social Gatherings with Friends and Family: Once a week    Attends Religious Services: Never    Database administrator or Organizations: Yes    Attends Banker Meetings: Never    Marital Status: Widowed  Intimate Partner Violence: Not At Risk (11/18/2022)   Humiliation, Afraid, Rape, and Kick questionnaire    Fear of Current or Ex-Partner: No    Emotionally Abused: No    Physically Abused: No    Sexually Abused: No     Allergies  Allergen Reactions   Doxycycline     REACTION: vomiting     Outpatient Medications Prior to Visit  Medication Sig Dispense Refill   albuterol  (PROVENTIL ) (2.5 MG/3ML) 0.083% nebulizer solution USE 1 VIAL IN NEBULIZER EVERY 6 HOURS - and as needed 9 mL 11   apixaban  (ELIQUIS ) 5 MG TABS tablet Take 1 tablet (5 mg total) by mouth 2 (two) times  daily. 60 tablet 0   bisoprolol  (ZEBETA ) 5 MG tablet TAKE 1 TABLET DAILY 90 tablet 3   cholecalciferol (VITAMIN D) 1000 units tablet Take 1,000 Units by mouth daily.     finasteride  (PROSCAR ) 5 MG tablet Take 1 tablet (5 mg total) by mouth daily. 90 tablet 0   guaiFENesin -codeine  100-10 MG/5ML syrup Take 5 mLs  by mouth every 6 (six) hours as needed for cough. 118 mL 1   hydrochlorothiazide  (HYDRODIURIL ) 12.5 MG tablet Take 1 tablet (12.5 mg total) by mouth daily. 90 tablet 1   losartan  (COZAAR ) 100 MG tablet Take 1 tablet by mouth daily.     montelukast  (SINGULAIR ) 10 MG tablet TAKE 1 TABLET AT BEDTIME 90 tablet 3   Multiple Vitamins-Minerals (CENTRUM SILVER ADULT 50+ PO) Take 1 tablet by mouth daily.      omeprazole  (PRILOSEC) 40 MG capsule Take 1 capsule (40 mg total) by mouth daily. 90 capsule 3   rosuvastatin  (CRESTOR ) 20 MG tablet Take 1 tablet (20 mg total) by mouth daily. 90 tablet 3   tamsulosin  (FLOMAX ) 0.4 MG CAPS capsule Take 2 capsules (0.8 mg total) by mouth daily after supper. 180 capsule 3   traZODone  (DESYREL ) 100 MG tablet TAKE 1 TABLET AT BEDTIME 90 tablet 3   VENTOLIN  HFA 108 (90 Base) MCG/ACT inhaler USE 2 INHALATIONS EVERY 4 HOURS AS NEEDED. RESCUE FOR SHORTNESS OF BREATH 54 g 1   azithromycin  (ZITHROMAX ) 500 MG tablet One pill daily for three days as needed for a COPD exacerbation 3 tablet 5   mometasone  (ASMANEX ) 220 MCG/INH inhaler Inhale 2 puffs into the lungs at bedtime.     predniSONE  (DELTASONE ) 10 MG tablet Take 10 mg by mouth daily with breakfast.     Tiotropium Bromide -Olodaterol 2.5-2.5 MCG/ACT AERS Inhale 2 puffs into the lungs daily.     No facility-administered medications prior to visit.   Review of Systems  Constitutional:  Negative for chills, fever, malaise/fatigue and weight loss.  HENT:  Negative for congestion, sinus pain and sore throat.   Eyes: Negative.   Respiratory:  Positive for shortness of breath. Negative for cough, hemoptysis, sputum  production and wheezing.   Cardiovascular:  Negative for chest pain, palpitations, orthopnea, claudication and leg swelling.  Gastrointestinal:  Negative for abdominal pain, heartburn, nausea and vomiting.  Genitourinary: Negative.   Musculoskeletal:  Negative for joint pain and myalgias.  Skin:  Negative for rash.  Neurological:  Negative for weakness.  Endo/Heme/Allergies: Negative.   Psychiatric/Behavioral: Negative.     Objective:   Vitals:   11/10/23 0926  BP: 115/70  Pulse: 95  SpO2: 93%  Weight: 150 lb (68 kg)  Height: 5\' 11"  (1.803 m)     Physical Exam Constitutional:      General: He is not in acute distress.    Comments: thin  HENT:     Head: Normocephalic and atraumatic.  Eyes:     Conjunctiva/sclera: Conjunctivae normal.  Cardiovascular:     Rate and Rhythm: Normal rate and regular rhythm.     Pulses: Normal pulses.     Heart sounds: Normal heart sounds. No murmur heard. Pulmonary:     Effort: Pulmonary effort is normal.     Breath sounds: Decreased air movement present. Decreased breath sounds present. No wheezing, rhonchi or rales.  Neurological:     General: No focal deficit present.     Mental Status: He is alert.    CBC    Component Value Date/Time   WBC 10.4 10/28/2023 1135   WBC 13.0 (H) 03/29/2022 0410   RBC 4.12 (L) 10/28/2023 1135   RBC 4.04 (L) 03/29/2022 0410   HGB 10.8 (L) 10/28/2023 1135   HCT 36.8 (L) 10/28/2023 1135   PLT 327 10/28/2023 1135   MCV 89 10/28/2023 1135   MCH 26.2 (L) 10/28/2023 1135   MCH 25.0 (L) 03/29/2022  0410   MCHC 29.3 (L) 10/28/2023 1135   MCHC 28.7 (L) 03/29/2022 0410   RDW 14.4 10/28/2023 1135   LYMPHSABS 1.6 03/29/2022 0410   MONOABS 1.2 (H) 03/29/2022 0410   EOSABS 0.1 03/29/2022 0410   BASOSABS 0.0 03/29/2022 0410      Latest Ref Rng & Units 10/28/2023   11:35 AM 08/01/2023   11:14 AM 05/20/2023   12:27 PM  BMP  Glucose 70 - 99 mg/dL 098  119  147   BUN 8 - 27 mg/dL 19  26  24    Creatinine 0.76 -  1.27 mg/dL 8.29  5.62  1.30   BUN/Creat Ratio 10 - 24 17  19  17    Sodium 134 - 144 mmol/L 149  142  140   Potassium 3.5 - 5.2 mmol/L 4.1  4.1  3.9   Chloride 96 - 106 mmol/L 105  101  98   CO2 20 - 29 mmol/L 26  24  26    Calcium  8.6 - 10.2 mg/dL 9.2  9.2  9.7    Chest imaging: CXR 03/29/22 Lungs are markedly hyperexpanded. The lungs are clear without focal pneumonia, edema, pneumothorax or pleural effusion. Streaky linear opacities in the left mid lung and left lung base likely reflect chronic atelectasis or scarring. Calcified granuloma noted medial left upper lobe. The cardiopericardial silhouette is within normal limits for size. The visualized bony structures of the thorax are unremarkable. Telemetry leads overlie the chest.  CT Chest 06/16/21 8 mm irregular nodular density is noted anteriorly in the right upper lobe. Non-contrast chest CT at 6-12 months is recommended. If the nodule is stable at time of repeat CT, then future CT at 18-24 months (from today's scan) is considered optional for low-risk patients, but is recommended for high-risk patients.  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  He is to continue on supplemental oxygen  3L at rest and 4L with exertion. He is to continue oxygen  use at night when sleeping.   He is  to continue on stiolto and asmanex  inhalers daily. He can continue nebulizer treatments as needed. He is to continue azithromycin  3 days per week. He can continue prednisone  10mg  daily. Continue montelukast  10mg  daily. Refills sent to [facility] pharmacy today.  Follow up in 6 months.   Duaine German, MD [facility] Office: [phone]   Current Outpatient Medications:    albuterol  (PROVENTIL ) (2.5 MG/3ML) 0.083% nebulizer solution, USE 1 VIAL IN NEBULIZER EVERY 6 HOURS - and as needed, Disp: 9 mL, Rfl: 11   apixaban  (ELIQUIS ) 5 MG TABS tablet, Take 1 tablet (5 mg total) by mouth 2 (two) times daily., Disp: 60 tablet, Rfl: 0   [START ON 11/11/2023] azithromycin  (ZITHROMAX ) 500 MG tablet, Take 1 tablet (500 mg total) by mouth every Monday, Wednesday, and Friday., Disp: 36 tablet, Rfl: 3  bisoprolol  (ZEBETA ) 5 MG tablet, TAKE 1 TABLET DAILY, Disp: 90 tablet, Rfl: 3   cholecalciferol (VITAMIN D) 1000 units tablet, Take 1,000 Units by mouth daily., Disp: , Rfl:    finasteride  (PROSCAR ) 5 MG tablet, Take 1 tablet (5 mg total) by mouth daily., Disp: 90 tablet, Rfl: 0   guaiFENesin -codeine  100-10 MG/5ML syrup, Take 5 mLs by mouth every 6 (six) hours as needed for cough., Disp: 118 mL, Rfl: 1   hydrochlorothiazide  (HYDRODIURIL ) 12.5 MG tablet, Take 1 tablet (12.5 mg total) by mouth daily., Disp: 90 tablet, Rfl: 1   losartan  (COZAAR ) 100 MG tablet, Take 1 tablet by mouth daily., Disp: , Rfl:    mometasone  (ASMANEX ) 220 MCG/ACT inhaler, Inhale 2 puffs into the lungs daily., Disp: 3 each, Rfl: 3   montelukast  (SINGULAIR ) 10 MG tablet, TAKE 1 TABLET AT BEDTIME, Disp: 90 tablet, Rfl: 3   Multiple Vitamins-Minerals (CENTRUM SILVER ADULT 50+ PO), Take 1 tablet by mouth daily. , Disp: , Rfl:    omeprazole  (PRILOSEC) 40 MG capsule, Take 1 capsule (40 mg total) by mouth daily., Disp: 90 capsule, Rfl: 3   rosuvastatin  (CRESTOR ) 20 MG tablet, Take 1 tablet (20 mg total) by mouth daily., Disp: 90  tablet, Rfl: 3   tamsulosin  (FLOMAX ) 0.4 MG CAPS capsule, Take 2 capsules (0.8 mg total) by mouth daily after supper., Disp: 180 capsule, Rfl: 3   traZODone  (DESYREL ) 100 MG tablet, TAKE 1 TABLET AT BEDTIME, Disp: 90 tablet, Rfl: 3   VENTOLIN  HFA 108 (90 Base) MCG/ACT inhaler, USE 2 INHALATIONS EVERY 4 HOURS AS NEEDED. RESCUE FOR SHORTNESS OF BREATH, Disp: 54 g, Rfl: 1   predniSONE  (DELTASONE ) 10 MG tablet, Take 1 tablet (10 mg total) by mouth daily with breakfast., Disp: 90 tablet, Rfl: 3   Tiotropium Bromide -Olodaterol 2.5-2.5 MCG/ACT AERS, Inhale 2 puffs into the lungs daily., Disp: 12 g, Rfl: 3

## 2023-11-19 DIAGNOSIS — J449 Chronic obstructive pulmonary disease, unspecified: Secondary | ICD-10-CM | POA: Diagnosis not present

## 2023-11-30 ENCOUNTER — Encounter: Payer: Self-pay | Admitting: Family Medicine

## 2023-12-05 ENCOUNTER — Other Ambulatory Visit: Payer: Self-pay | Admitting: Family Medicine

## 2023-12-06 ENCOUNTER — Encounter: Payer: Self-pay | Admitting: Gastroenterology

## 2023-12-14 ENCOUNTER — Other Ambulatory Visit: Payer: Self-pay | Admitting: Family Medicine

## 2023-12-14 DIAGNOSIS — E7800 Pure hypercholesterolemia, unspecified: Secondary | ICD-10-CM

## 2023-12-15 ENCOUNTER — Ambulatory Visit: Payer: Medicare PPO

## 2023-12-15 VITALS — Ht 71.0 in | Wt 147.0 lb

## 2023-12-15 DIAGNOSIS — Z0000 Encounter for general adult medical examination without abnormal findings: Secondary | ICD-10-CM | POA: Diagnosis not present

## 2023-12-15 NOTE — Progress Notes (Cosign Needed Addendum)
 Because this visit was a virtual/telehealth visit,  certain criteria was not obtained, such a blood pressure, CBG if applicable, and timed get up and go. Any medications not marked as "taking" were not mentioned during the medication reconciliation part of the visit. Any vitals not documented were not able to be obtained due to this being a telehealth visit or patient was unable to self-report a recent blood pressure reading due to a lack of equipment at home via telehealth. Vitals that have been documented are verbally provided by the patient.   Subjective:   Michael Robertson. is a 78 y.o. who presents for a Medicare Wellness preventive visit.  As a reminder, Annual Wellness Visits don't include a physical exam, and some assessments may be limited, especially if this visit is performed virtually. We may recommend an in-person follow-up visit with your provider if needed.  Visit Complete: Virtual I connected with  Michael Robertson. on 12/15/23 by a audio enabled telemedicine application and verified that I am speaking with the correct person using two identifiers.  Patient Location: Home  Provider Location: Office/Clinic  I discussed the limitations of evaluation and management by telemedicine. The patient expressed understanding and agreed to proceed.  Vital Signs: Because this visit was a virtual/telehealth visit, some criteria may be missing or patient reported. Any vitals not documented were not able to be obtained and vitals that have been documented are patient reported.  VideoDeclined- This patient declined Librarian, academic. Therefore the visit was completed with audio only.  Persons Participating in Visit: Patient.  AWV Questionnaire: Yes: Patient Medicare AWV questionnaire was completed by the patient on 12/14/2023; I have confirmed that all information answered by patient is correct and no changes since this date.  Cardiac Risk Factors include:  advanced age (>47men, >46 women);dyslipidemia;family history of premature cardiovascular disease;hypertension;male gender;sedentary lifestyle;diabetes mellitus     Objective:     Today's Vitals   12/15/23 0912  Weight: 147 lb (66.7 kg)  Height: 5\' 11"  (1.803 m)  PainSc: 0-No pain   Body mass index is 20.5 kg/m.     12/15/2023    9:13 AM 11/24/2022    3:08 PM 11/18/2022    9:46 AM 06/03/2022   10:42 AM 10/12/2021   11:19 AM 10/07/2021    1:34 PM 09/30/2021    3:21 PM  Advanced Directives  Does Patient Have a Medical Advance Directive? Yes No Yes No Yes Yes Yes  Type of Estate agent of [place];Living will  Healthcare Power of [place];Living will  Healthcare Power of [place];Living will Healthcare Power of [place];Living will Healthcare Power of [place];Living will  Does patient want to make changes to medical advance directive?   No - Patient declined   No - Patient declined No - Patient declined  Copy of Healthcare Power of Attorney in Chart? No - copy requested  No - copy requested    Yes - validated most recent copy scanned in chart (See row information)  Would patient like information on creating a medical advance directive?    No - Patient declined       Current Medications (verified) Outpatient Encounter Medications as of 12/15/2023  Medication Sig   albuterol  (PROVENTIL ) (2.5 MG/3ML) 0.083% nebulizer solution USE 1 VIAL IN NEBULIZER EVERY 6 HOURS - and as needed   apixaban  (ELIQUIS ) 5 MG TABS tablet Take 1 tablet (5 mg total) by mouth 2 (two) times daily.   azithromycin  (ZITHROMAX ) 500 MG tablet  Take 1 tablet (500 mg total) by mouth every Monday, Wednesday, and Friday.   bisoprolol  (ZEBETA ) 5 MG tablet TAKE 1 TABLET DAILY   cholecalciferol (VITAMIN D) 1000 units tablet Take 1,000 Units by mouth daily.   finasteride  (PROSCAR ) 5 MG tablet Take 1 tablet (5 mg total) by mouth daily.   guaiFENesin -codeine  100-10 MG/5ML syrup Take 5 mLs by mouth every 6 (six)  hours as needed for cough.   hydrochlorothiazide  (HYDRODIURIL ) 12.5 MG tablet Take 1 tablet (12.5 mg total) by mouth daily.   losartan  (COZAAR ) 100 MG tablet Take 1 tablet by mouth daily.   mometasone  (ASMANEX ) 220 MCG/ACT inhaler Inhale 2 puffs into the lungs daily.   montelukast  (SINGULAIR ) 10 MG tablet TAKE 1 TABLET AT BEDTIME   Multiple Vitamins-Minerals (CENTRUM SILVER ADULT 50+ PO) Take 1 tablet by mouth daily.    omeprazole  (PRILOSEC) 40 MG capsule Take 1 capsule (40 mg total) by mouth daily.   predniSONE  (DELTASONE ) 10 MG tablet Take 1 tablet (10 mg total) by mouth daily with breakfast.   rosuvastatin  (CRESTOR ) 20 MG tablet TAKE 1 TABLET DAILY   tamsulosin  (FLOMAX ) 0.4 MG CAPS capsule Take 2 capsules (0.8 mg total) by mouth daily after supper.   Tiotropium Bromide -Olodaterol 2.5-2.5 MCG/ACT AERS Inhale 2 puffs into the lungs daily.   traZODone  (DESYREL ) 100 MG tablet TAKE 1 TABLET AT BEDTIME   VENTOLIN  HFA 108 (90 Base) MCG/ACT inhaler USE 2 INHALATIONS EVERY 4 HOURS AS NEEDED. RESCUE FOR SHORTNESS OF BREATH   No facility-administered encounter medications on file as of 12/15/2023.    Allergies (verified) Doxycycline   History: Past Medical History:  Diagnosis Date   Chronic kidney disease    COPD (chronic obstructive pulmonary disease) (HCC)    COPD exacerbation (HCC) 05/07/2010   Qualifier: Diagnosis of   By: Bronson Canny MD, Sammie Crigler         GERD (gastroesophageal reflux disease)    Hyperlipidemia    Hypertension    Insomnia    Oxygen  deficiency    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   History reviewed. No pertinent surgical history. 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    His wife recent passed away in 2021-09-18.   Patient does drive occasionally. His son does must of his shopping.    Patient is on home oxygen . Patient uses a scooter when out of his home.    Social Drivers of Corporate investment banker Strain: Low Risk  (12/15/2023)   Overall Financial Resource Strain (CARDIA)    Difficulty of Paying Living Expenses: Not hard at all  Food Insecurity: No Food Insecurity (12/15/2023)   Hunger Vital Sign    Worried About Running Out of Food in the Last Year: Never true    Ran Out of Food in the Last Year: Never true  Transportation Needs: No Transportation Needs (12/15/2023)   PRAPARE - Administrator, Civil Service (Medical): No    Lack of Transportation (Non-Medical): No  Physical Activity: Insufficiently Active (12/15/2023)   Exercise Vital Sign    Days of Exercise per Week: 5 days    Minutes of Exercise per Session: 20 min  Stress: Stress Concern Present (12/15/2023)   [employer] of Occupational Health - Occupational Stress Questionnaire    Feeling of Stress : To some extent  Social  Connections: Moderately Isolated (12/15/2023)   Social Connection and Isolation Panel [NHANES]    Frequency of Communication with Friends and Family: More than three times a week    Frequency of Social Gatherings with Friends and Family: Once a week    Attends Religious Services: Never    Database administrator or Organizations: Yes    Attends Banker Meetings: Never    Marital Status: Widowed    Tobacco Counseling Counseling given: Not Answered    Clinical Intake:  Pre-visit preparation completed: Yes  Pain : No/denies pain Pain Score: 0-No pain     BMI - recorded: 20.5 Nutritional Status: BMI of 19-24  Normal Nutritional Risks: None Diabetes: Yes CBG done?: No Did pt. bring in CBG monitor from home?: No  Lab Results  Component Value Date   HGBA1C 6.6 10/28/2023   HGBA1C 7.3 (H) 05/20/2023   HGBA1C 7.3 (H) 11/24/2022     How often do you need to have someone help you when you read instructions, pamphlets, or other written materials from your doctor or pharmacy?: 1 - Never What is the last grade level you completed in school?: SOME COLLEGE  Interpreter Needed?: No  Information entered by :: Sharda Keddy N. Daiana Vitiello, LPN.   Activities of Daily Living     12/15/2023    9:28 AM 12/14/2023    6:30 PM  In your present state of health, do you have any difficulty performing the following activities:  Hearing? 0 0  Vision? 0 0  Difficulty concentrating or making decisions? 0 0  Walking or climbing stairs? 0 0  Dressing or bathing? 0 0  Doing errands, shopping? 0 0  Preparing Food and eating ? Y Y  Using the Toilet? N N  In the past six months, have you accidently leaked urine? N N  Do you have problems with loss of bowel control? N N  Managing your Medications? N N  Managing your Finances? N N  Housekeeping or managing your Housekeeping? Y Y    Patient Care Team: Kandis Ormond, DO as PCP - General (Family Medicine) Wilfredo Hanly, MD as  Consulting Physician (Pulmonary Disease) [place],  Scot Cutter, MD as Consulting Physician (Ophthalmology)  Indicate any recent Medical Services you may have received from other than [facility] providers in the past year (date may be approximate).     Assessment:    This is a routine wellness examination for [place].  Hearing/Vision screen Hearing Screening - Comments:: Some hearing difficulties. No hearing aids.  Vision Screening - Comments:: Wears rx glasses, cataracts removed- up to date with routine eye exams with Dr. Lorraine R[facility] at [facility]    Goals Addressed             This Visit's Progress    My goal is to find out what's wrong with my iron levels in July.         Depression Screen     12/15/2023    9:15 AM 10/28/2023   10:55 AM 08/01/2023   11:36 AM 11/24/2022    3:08 PM 11/18/2022    9:39 AM 04/15/2022    9:17 AM 10/12/2021   11:18 AM  PHQ 2/9 Scores  PHQ - 2 Score 0 2 3 3  0 2 2  PHQ- 9 Score 1 5 6 6  3 6     Fall Risk     12/15/2023    9:15 AM 12/14/2023    6:30 PM 11/10/2023    9:26 AM 10/28/2023   10:52 AM 08/01/2023   11:36 AM  Fall Risk   Falls in the past year? 0 0 0 0 0  Number falls in past yr: 0 0     Injury with Fall? 0 0     Risk for fall due to : No Fall Risks      Follow up Falls evaluation completed        MEDICARE RISK AT HOME:  Medicare Risk at Home Any stairs in or around the home?: Yes If so, are there any without handrails?: No Home free of loose throw rugs in walkways, pet beds, electrical cords, etc?: Yes Adequate lighting in your home to reduce risk of falls?: Yes Life alert?: No Use of a cane, walker or w/c?: No Grab bars in the bathroom?: No Shower chair or bench in shower?: Yes Elevated toilet seat or a handicapped toilet?: No  TIMED UP AND GO:  Was the test performed?  No  Cognitive Function: Declined/Normal: No cognitive concerns noted by patient or family. Patient alert, oriented, able to answer questions  appropriately and recall recent events. No signs of memory loss or confusion.    12/15/2023    9:18 AM 03/02/2011    4:00 PM  MMSE - Mini Mental State Exam  Not completed: Unable to complete   Orientation to time  5  Orientation to Place  5  Registration  3  Attention/ Calculation  5  Recall  3  Language- name 2 objects  2  Language- repeat  1  Language- follow 3 step command  3  Language- read & follow direction  1  Write a sentence  1  Copy design  0  Total score  29        12/15/2023    9:17 AM 11/18/2022    9:44 AM 09/30/2021    3:23 PM 02/21/2019    9:59 AM  6CIT Screen  What Year? 0 points 0 points 0 points 0 points  What month? 0 points 0 points 0 points 0 points  What time? 0 points 0 points 0 points 0 points  Count back from 20 0 points 0 points  0 points 0 points  Months in reverse 0 points 0 points 0 points 2 points  Repeat phrase 0 points 0 points 0 points 0 points  Total Score 0 points 0 points 0 points 2 points    Immunizations Immunization History  Administered Date(s) Administered   Fluad Quad(high Dose 65+) 05/11/2021, 04/15/2022   Fluad Trivalent(High Dose 65+) 05/20/2023   Influenza Split 04/09/2011, 04/04/2012   Influenza Whole 06/12/2007, 05/06/2008, 05/20/2009, 04/17/2010   Influenza,inj,Quad PF,6+ Mos 04/06/2013, 04/10/2014, 04/03/2015, 04/07/2016, 04/14/2017, 04/05/2018, 04/12/2019   Influenza-Unspecified 04/15/2020   Moderna Covid-19 Vaccine Bivalent Booster 44yrs & up 06/03/2021   Moderna Sars-Covid-2 Vaccination 09/01/2019, 09/30/2019, 05/13/2020   PNEUMOCOCCAL CONJUGATE-20 05/20/2023   Pfizer(Comirnaty)Fall Seasonal Vaccine 12 years and older 05/20/2023   Pneumococcal Conjugate-13 04/06/2013   Pneumococcal Polysaccharide-23 06/19/2003, 10/08/2009, 04/04/2015   Respiratory Syncytial Virus Vaccine,Recomb Aduvanted(Arexvy) 07/02/2022   Td 07/20/2003, 12/11/2020   Tdap 09/28/2013   Zoster Recombinant(Shingrix) 01/12/2021, 03/16/2021     Screening Tests Health Maintenance  Topic Date Due   COVID-19 Vaccine (8 - 2024-25 season) 11/17/2023   OPHTHALMOLOGY EXAM  11/29/2023   INFLUENZA VACCINE  02/17/2024   HEMOGLOBIN A1C  04/28/2024   FOOT EXAM  05/19/2024   DTaP/Tdap/Td (4 - Td or Tdap) 12/12/2030   Pneumonia Vaccine 45+ Years old  Completed   Hepatitis C Screening  Completed   Zoster Vaccines- Shingrix  Completed   HPV VACCINES  Aged Out   Meningococcal B Vaccine  Aged Out   Colonoscopy  Discontinued    Health Maintenance  Health Maintenance Due  Topic Date Due   COVID-19 Vaccine (8 - 2024-25 season) 11/17/2023   OPHTHALMOLOGY EXAM  11/29/2023   Health Maintenance Items Addressed: Yes Patient aware of current care gaps.  Immunization record was verified by [facility] and updated in patient's chart.   Additional Screening:  Vision Screening: Recommended annual ophthalmology exams for early detection of glaucoma and other disorders of the eye.  Dental Screening: Recommended annual dental exams for proper oral hygiene  Community Resource Referral / Chronic Care Management: CRR required this visit?  No   CCM required this visit?  No   Plan:    I have personally reviewed and noted the following in the patient's chart:   Medical and social history Use of alcohol, tobacco or illicit drugs  Current medications and supplements including opioid prescriptions. Patient is not currently taking opioid prescriptions. Functional ability and status Nutritional status Physical activity Advanced directives List of other physicians Hospitalizations, surgeries, and ER visits in previous 12 months Vitals Screenings to include cognitive, depression, and falls Referrals and appointments  In addition, I have reviewed and discussed with patient certain preventive protocols, quality metrics, and best practice recommendations. A written personalized care plan for preventive services as well as general preventive health  recommendations were provided to patient.   Margette Sheldon, LPN   1/61/0960   After Visit Summary: (MyChart) Due to this being a telephonic visit, the after visit summary with patients personalized plan was offered to patient via MyChart   Notes: Patient aware of current care gaps.  Immunization record was verified by [facility] and updated in patient's chart.

## 2023-12-15 NOTE — Patient Instructions (Signed)
 Mr. Muccio , Thank you for taking time out of your busy schedule to complete your Annual Wellness Visit with me. I enjoyed our conversation and look forward to speaking with you again next year. I, as well as your care team,  appreciate your ongoing commitment to your health goals. Please review the following plan we discussed and let me know if I can assist you in the future. Your Game plan/ To Do List    Referrals: If you haven't heard from the office you've been referred to, please reach out to them at the phone provided.   Follow up Visits: Next Medicare AWV with our clinical staff: 12/17/2024 at 11:10 a.m. phone visit with Nurse Health Advisor   Have you seen your provider in the last 6 months (3 months if uncontrolled diabetes)? Yes Next Office Visit with your provider: Patient will call to schedule after seeing GI specialist in July  Clinician Recommendations:  Aim for 30 minutes of exercise or brisk walking, 6-8 glasses of water, and 5 servings of fruits and vegetables each day.       This is a list of the screening recommended for you and due dates:  Health Maintenance  Topic Date Due   COVID-19 Vaccine (8 - 2024-25 season) 11/17/2023   Eye exam for diabetics  11/29/2023   Flu Shot  02/17/2024   Hemoglobin A1C  04/28/2024   Complete foot exam   05/19/2024   DTaP/Tdap/Td vaccine (4 - Td or Tdap) 12/12/2030   Pneumonia Vaccine  Completed   Hepatitis C Screening  Completed   Zoster (Shingles) Vaccine  Completed   HPV Vaccine  Aged Out   Meningitis B Vaccine  Aged Out   Colon Cancer Screening  Discontinued    Advanced directives: (Declined) Advance directive discussed with you today. Even though you declined this today, please call our office should you change your mind, and we can give you the proper paperwork for you to fill out. Advance Care Planning is important because it:  [x]  Makes sure you receive the medical care that is consistent with your values, goals, and  preferences  [x]  It provides guidance to your family and loved ones and reduces their decisional burden about whether or not they are making the right decisions based on your wishes.  Follow the link provided in your after visit summary or read over the paperwork we have mailed to you to help you started getting your Advance Directives in place. If you need assistance in completing these, please reach out to us  so that we can help you!  See attachments for Preventive Care and Fall Prevention Tips.

## 2023-12-20 DIAGNOSIS — J449 Chronic obstructive pulmonary disease, unspecified: Secondary | ICD-10-CM | POA: Diagnosis not present

## 2024-01-11 LAB — LAB REPORT - SCANNED: Microalb Creat Ratio: 9

## 2024-01-16 ENCOUNTER — Other Ambulatory Visit: Payer: Self-pay | Admitting: Family Medicine

## 2024-01-19 DIAGNOSIS — J449 Chronic obstructive pulmonary disease, unspecified: Secondary | ICD-10-CM | POA: Diagnosis not present

## 2024-01-24 NOTE — Progress Notes (Unsigned)
 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    Interval History  Patient admits/denies GERD Patient admits/denies dysphagia Patient admits/denies nausea, vomiting, or weight loss  Patient admits/denies altered bowel habits Patient admits/denies abdominal pain Patient admits/denies rectal bleeding   Denies/Admits alcohol Denies/Admits smoking Denies/Admits NSAID use. Denies/Admits they are on blood thinners. Patient on Eliquis  5mg   Patients last colonoscopy Patients last EGD  He is using 3-4L of O2.   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  Current Outpatient Medications  Medication Sig Dispense Refill   albuterol  (PROVENTIL ) (2.5 MG/3ML) 0.083% nebulizer solution USE 1 VIAL IN NEBULIZER EVERY 6 HOURS - and as needed 9 mL 11   apixaban  (ELIQUIS ) 5 MG TABS tablet Take 1 tablet (5 mg total) by mouth 2 (two) times daily. 60 tablet 0   azithromycin  (ZITHROMAX ) 500 MG tablet Take 1 tablet (500 mg total) by mouth every Monday, Wednesday, and Friday. 36 tablet 3   bisoprolol  (ZEBETA ) 5 MG tablet TAKE 1 TABLET DAILY 90 tablet 3   cholecalciferol  (VITAMIN D) 1000 units tablet Take 1,000 Units by mouth daily.     finasteride  (PROSCAR ) 5 MG tablet Take 1 tablet (5 mg total) by mouth daily. 90 tablet 3   guaiFENesin -codeine  100-10 MG/5ML syrup Take 5 mLs by mouth every 6 (six) hours as needed for cough. 118 mL 1   hydrochlorothiazide  (HYDRODIURIL ) 12.5 MG tablet Take 1 tablet (12.5 mg total) by mouth daily. 90 tablet 1   losartan  (COZAAR ) 100 MG tablet Take 1 tablet by mouth daily.     mometasone  (ASMANEX ) 220 MCG/ACT inhaler Inhale 2 puffs into the lungs daily. 3 each 3   montelukast  (SINGULAIR ) 10 MG tablet TAKE 1 TABLET AT BEDTIME 90 tablet 3   Multiple Vitamins-Minerals (CENTRUM SILVER ADULT 50+ PO) Take 1 tablet by mouth daily.      omeprazole  (PRILOSEC) 40 MG capsule Take 1 capsule (40 mg total) by mouth daily. 90 capsule 3   predniSONE  (DELTASONE ) 10 MG tablet Take 1 tablet (10 mg total) by mouth daily with breakfast. 90 tablet 3   rosuvastatin  (CRESTOR ) 20 MG tablet TAKE 1 TABLET DAILY 90 tablet 3   tamsulosin  (FLOMAX ) 0.4 MG CAPS capsule Take 2 capsules (0.8 mg total) by mouth daily after supper. 180 capsule 3   Tiotropium Bromide -Olodaterol 2.5-2.5 MCG/ACT AERS Inhale 2 puffs into the lungs daily. 12 g 3   traZODone  (DESYREL ) 100 MG tablet TAKE 1 TABLET AT BEDTIME 90 tablet 3   VENTOLIN  HFA 108 (90  Base) MCG/ACT inhaler USE 2 INHALATIONS EVERY 4 HOURS AS NEEDED. RESCUE FOR SHORTNESS OF BREATH 54 g 1   No current facility-administered medications for this visit.    Allergies as of 01/25/2024 - Review Complete 12/16/2023  Allergen Reaction Noted   Doxycycline  12/25/2008    Family History  Problem Relation Age of Onset   Stroke Mother    Dementia Mother    Diabetes Mother    Cancer Father        colon   Stroke Father    Alzheimer's disease Sister    Diabetes Sister    Dementia Sister    Obesity Daughter    Diabetes Daughter    Hypertension Daughter    Stroke Son     Review of Systems:    Constitutional: No  weight loss, fever, chills, weakness or fatigue HEENT: Eyes: No change in vision               Ears, Nose, Throat:  No change in hearing or congestion Skin: No rash or itching Cardiovascular: No chest pain, chest pressure or palpitations   Respiratory: No SOB or cough Gastrointestinal: See HPI and otherwise negative Genitourinary: No dysuria or change in urinary frequency Neurological: No headache, dizziness or syncope Musculoskeletal: No new muscle or joint pain Hematologic: No bleeding or bruising Psychiatric: No history of depression or anxiety    Physical Exam:  Vital signs: There were no vitals taken for this visit.  Constitutional:   Pleasant *** male appears to be in NAD, Well developed, Well nourished, alert and cooperative Head:  Normocephalic and atraumatic. Eyes:   PEERL, EOMI. No icterus. Conjunctiva pink. Ears:  Normal auditory acuity. Neck:  Supple Throat: Oral cavity and pharynx without inflammation, swelling or lesion.  Respiratory: Respirations even and unlabored. Lungs clear to auscultation bilaterally.   No wheezes, crackles, or rhonchi.  Cardiovascular: Normal S1, S2. Regular rate and rhythm. No peripheral edema, cyanosis or pallor.  Gastrointestinal:  Soft, nondistended, nontender. No rebound or guarding. Normal bowel sounds. No appreciable masses or hepatomegaly. Rectal:  Not performed.  Anoscopy: Msk:  Symmetrical without gross deformities. Without edema, no deformity or joint abnormality.  Neurologic:  Alert and  oriented x4;  grossly normal neurologically.  Skin:   Dry and intact without significant lesions or rashes. Psychiatric: Oriented to person, place and time. Demonstrates good judgement and reason without abnormal affect or behaviors.  RELEVANT LABS AND IMAGING: CBC    Latest Ref Rng & Units 10/28/2023   11:35 AM 05/20/2023   12:27 PM 03/29/2022    4:10 AM  CBC  WBC 3.4 - 10.8 x10E3/uL 10.4  17.0  13.0   Hemoglobin 13.0 - 17.7 g/dL 89.1  89.2   89.8   Hematocrit 37.5 - 51.0 % 36.8  36.5  35.2   Platelets 150 - 450 x10E3/uL 327  412  373      CMP     Latest Ref Rng & Units 10/28/2023   11:35 AM 08/01/2023   11:14 AM 05/20/2023   12:27 PM  CMP  Glucose 70 - 99 mg/dL 882  852  871   BUN 8 - 27 mg/dL 19  26  24    Creatinine 0.76 - 1.27 mg/dL 8.85  8.63  8.54   Sodium 134 - 144 mmol/L 149  142  140   Potassium 3.5 - 5.2 mmol/L 4.1  4.1  3.9   Chloride 96 - 106 mmol/L 105  101  98   CO2 20 -  29 mmol/L 26  24  26    Calcium  8.6 - 10.2 mg/dL 9.2  9.2  9.7      Lab Results  Component Value Date   TSH 1.730 05/08/2020   08/2009 colonoscopy with Dr. Alm Gander No polyps or cancer Otherwise normal exam  Assessment: Hgb 13 (4/22)>>> 12.2 (11/22)>>>10.1 (9/23)>>>10.7 (11/24)>>10.8  Plan: 1. ***   Thank you for the courtesy of this consult. Please call me with any questions or concerns.   Jorgeluis Gurganus, FNP-C [facility] 01/24/2024, 12:27 PM  Cc: Rumball, Alison M, DO

## 2024-01-25 ENCOUNTER — Telehealth: Payer: Self-pay

## 2024-01-25 ENCOUNTER — Ambulatory Visit (INDEPENDENT_AMBULATORY_CARE_PROVIDER_SITE_OTHER): Admitting: Gastroenterology

## 2024-01-25 ENCOUNTER — Other Ambulatory Visit (INDEPENDENT_AMBULATORY_CARE_PROVIDER_SITE_OTHER)

## 2024-01-25 ENCOUNTER — Encounter: Payer: Self-pay | Admitting: Gastroenterology

## 2024-01-25 VITALS — BP 142/56 | HR 84 | Ht 71.0 in | Wt 149.0 lb

## 2024-01-25 DIAGNOSIS — R194 Change in bowel habit: Secondary | ICD-10-CM | POA: Diagnosis not present

## 2024-01-25 DIAGNOSIS — I480 Paroxysmal atrial fibrillation: Secondary | ICD-10-CM | POA: Diagnosis not present

## 2024-01-25 DIAGNOSIS — Z800 Family history of malignant neoplasm of digestive organs: Secondary | ICD-10-CM | POA: Diagnosis not present

## 2024-01-25 DIAGNOSIS — K219 Gastro-esophageal reflux disease without esophagitis: Secondary | ICD-10-CM | POA: Diagnosis not present

## 2024-01-25 DIAGNOSIS — D649 Anemia, unspecified: Secondary | ICD-10-CM

## 2024-01-25 DIAGNOSIS — D509 Iron deficiency anemia, unspecified: Secondary | ICD-10-CM

## 2024-01-25 LAB — CBC WITH DIFFERENTIAL/PLATELET
Basophils Absolute: 0.1 K/uL (ref 0.0–0.1)
Basophils Relative: 0.7 % (ref 0.0–3.0)
Eosinophils Absolute: 0.2 K/uL (ref 0.0–0.7)
Eosinophils Relative: 2.3 % (ref 0.0–5.0)
HCT: 34.8 % — ABNORMAL LOW (ref 39.0–52.0)
Hemoglobin: 11 g/dL — ABNORMAL LOW (ref 13.0–17.0)
Lymphocytes Relative: 19 % (ref 12.0–46.0)
Lymphs Abs: 1.9 K/uL (ref 0.7–4.0)
MCHC: 31.5 g/dL (ref 30.0–36.0)
MCV: 87.1 fl (ref 78.0–100.0)
Monocytes Absolute: 1.1 K/uL — ABNORMAL HIGH (ref 0.1–1.0)
Monocytes Relative: 11.2 % (ref 3.0–12.0)
Neutro Abs: 6.5 K/uL (ref 1.4–7.7)
Neutrophils Relative %: 66.8 % (ref 43.0–77.0)
Platelets: 351 K/uL (ref 150.0–400.0)
RBC: 4 Mil/uL — ABNORMAL LOW (ref 4.22–5.81)
RDW: 16.3 % — ABNORMAL HIGH (ref 11.5–15.5)
WBC: 9.8 K/uL (ref 4.0–10.5)

## 2024-01-25 LAB — IBC + FERRITIN
Ferritin: 10.7 ng/mL — ABNORMAL LOW (ref 22.0–322.0)
Iron: 38 ug/dL — ABNORMAL LOW (ref 42–165)
Saturation Ratios: 8.5 % — ABNORMAL LOW (ref 20.0–50.0)
TIBC: 446.6 ug/dL (ref 250.0–450.0)
Transferrin: 319 mg/dL (ref 212.0–360.0)

## 2024-01-25 MED ORDER — PEG-KCL-NACL-NASULF-NA ASC-C 100 G PO SOLR: 1.0000 | Freq: Once | ORAL | 0 refills | Status: AC

## 2024-01-25 NOTE — Telephone Encounter (Signed)
 ARISCAT Score for Postoperative Pulmonary Complications  Intermediate risk 13.3% risk of in-hospital post-op pulmonary complications (composite including respiratory failure, respiratory infection, pleural effusion, atelectasis, pneumothorax, bronchospasm, aspiration pneumonitis)

## 2024-01-25 NOTE — Telephone Encounter (Signed)
 Will wait to hear from GI office before proceeding.

## 2024-01-25 NOTE — Telephone Encounter (Signed)
 Denise, CMA: I need just pulmonary clearance.   Me: so cardiology can disregard?   Denise: yes maam   Me: ok ty I will let my preop APP know to disregard.

## 2024-01-25 NOTE — Telephone Encounter (Signed)
 [facility] Pre-operative Risk Assessment     Request for surgical clearance:     Endoscopy Procedure  What type of surgery is being performed?     Endo/Colon  When is this surgery scheduled?     04/12/24  What type of clearance is required ?   Pulmonary  Practice name and name of physician performing surgery?      [facility]  What is your office phone and fax number?      Phone- [phone]  Fax- [phone]  Anesthesia type (None, local, MAC, general) ?       MAC   Please route your response to Karna Louder, CMA

## 2024-01-25 NOTE — Telephone Encounter (Signed)
  Gerber Penza [birth year] [id number]  01/25/24    Dear Donald CHRISTELLA Lai, DO:  We have scheduled the above named patient for a(n) endoscopic procedure. Our records show that (s)he is on anticoagulation therapy.  Please advise as to whether the patient may come off their therapy of Eliquis  2 days prior to their procedure which is scheduled for 04/12/24.  Please route your response to [facility] or fax response to [phone].  Sincerely,    [facility]

## 2024-01-25 NOTE — Telephone Encounter (Signed)
 I have sent a secure chat to CMA Karna Louder to confirm if need cardiac clearance or did they only need pulmonary clearance as stated in the request.   Secure chat:  clarification please. a clearance request was sent to cardiology, but says need pulmonary clearance. my APP for preop is asking if need cardiac clearance also. What type of clearance is required ? Pulmonary

## 2024-01-25 NOTE — Patient Instructions (Addendum)
 Recommend high fiber diet  We have sent the following medications to your pharmacy for you to pick up at your convenience: Moviprep  Your provider has requested that you go to the basement level for lab work before leaving today. Press B on the elevator. The lab is located at the first door on the left as you exit the elevator.  You have been scheduled for an endoscopy and colonoscopy. Please follow the written instructions given to you at your visit today.  If you use inhalers (even only as needed), please bring them with you on the day of your procedure.  DO NOT TAKE 7 DAYS PRIOR TO TEST- Trulicity (dulaglutide) Ozempic, Wegovy (semaglutide) Mounjaro (tirzepatide) Bydureon Bcise (exanatide extended release)  DO NOT TAKE 1 DAY PRIOR TO YOUR TEST Rybelsus (semaglutide) Adlyxin (lixisenatide) Victoza (liraglutide) Byetta (exanatide) ___________________________________________________________________________  _______________________________________________________  If your blood pressure at your visit was 140/90 or greater, please contact your primary care physician to follow up on this.  _______________________________________________________  If you are age 84 or older, your body mass index should be between 23-30. Your Body mass index is 20.78 kg/m. If this is out of the aforementioned range listed, please consider follow up with your Primary Care Provider.  If you are age 58 or younger, your body mass index should be between 19-25. Your Body mass index is 20.78 kg/m. If this is out of the aformentioned range listed, please consider follow up with your Primary Care Provider.   ________________________________________________________  The [facility] providers would like to encourage you to use MYCHART to communicate with providers for non-urgent requests or questions.  Due to long hold times on the telephone, sending your provider a message by [facility] may be a faster and more  efficient way to get a response.  Please allow 48 business hours for a response.  Please remember that this is for non-urgent requests.  _______________________________________________________  Thank you for trusting me with your gastrointestinal care. Deanna May, RNP

## 2024-01-25 NOTE — Telephone Encounter (Signed)
 Does this patient need a cardiology clearance?  Appears that the request is for pulmonary?

## 2024-02-01 ENCOUNTER — Telehealth: Payer: Self-pay | Admitting: *Deleted

## 2024-02-01 NOTE — Telephone Encounter (Signed)
 [facility] [address] [place], [place]  [address] Phone:  [phone]   Fax:  [phone]   Michael Robertson. DOB: [birth year] MRN: [id number]  Dear: Dr. Kara:   The patient above is schedule for a colonoscopy in the near future under general anesthesia (Propofol).    Please fax or route a note of Medical Clearance to [phone], Attn: Powell Misty, CMA.   Please advise if this patient will require an office visit or further medical work-up before clearance can be given.   Thank you,   Powell Misty, [facility]

## 2024-02-02 ENCOUNTER — Ambulatory Visit: Payer: Self-pay | Admitting: Gastroenterology

## 2024-02-02 DIAGNOSIS — D509 Iron deficiency anemia, unspecified: Secondary | ICD-10-CM

## 2024-02-02 MED ORDER — FERROUS GLUCONATE 324 (38 FE) MG PO TABS: 324.0000 mg | ORAL_TABLET | Freq: Every day | ORAL | 2 refills | Status: AC

## 2024-02-02 NOTE — Telephone Encounter (Signed)
 ARISCAT Score for Postoperative Pulmonary Complications Predicts risk of pulmonary complications after surgery, including respiratory failure.  Intermediate risk 13.3% risk of in-hospital post-op pulmonary complications (composite including respiratory failure, respiratory infection, pleural effusion, atelectasis, pneumothorax, bronchospasm, aspiration pneumonitis)  Recommend procedure be done at [facility] or [facility].  Dorn Chill, MD [facility] Office: [phone]

## 2024-02-03 ENCOUNTER — Other Ambulatory Visit: Payer: Self-pay | Admitting: *Deleted

## 2024-02-03 ENCOUNTER — Telehealth: Payer: Self-pay

## 2024-02-03 MED ORDER — FERROUS SULFATE 325 (65 FE) MG PO TABS: 325.0000 mg | ORAL_TABLET | Freq: Every day | ORAL | 3 refills | Status: DC

## 2024-02-03 NOTE — Telephone Encounter (Signed)
 Medication management

## 2024-02-07 ENCOUNTER — Other Ambulatory Visit: Payer: Self-pay | Admitting: Family Medicine

## 2024-02-07 DIAGNOSIS — J439 Emphysema, unspecified: Secondary | ICD-10-CM

## 2024-02-07 DIAGNOSIS — J441 Chronic obstructive pulmonary disease with (acute) exacerbation: Secondary | ICD-10-CM

## 2024-02-10 ENCOUNTER — Telehealth: Payer: Self-pay

## 2024-02-10 NOTE — Telephone Encounter (Signed)
 Eliquis  hold for 04/12/24 procedure

## 2024-02-15 ENCOUNTER — Other Ambulatory Visit: Payer: Self-pay | Admitting: Family Medicine

## 2024-02-15 DIAGNOSIS — N400 Enlarged prostate without lower urinary tract symptoms: Secondary | ICD-10-CM

## 2024-02-15 DIAGNOSIS — N401 Enlarged prostate with lower urinary tract symptoms: Secondary | ICD-10-CM

## 2024-02-19 DIAGNOSIS — J449 Chronic obstructive pulmonary disease, unspecified: Secondary | ICD-10-CM | POA: Diagnosis not present

## 2024-02-21 NOTE — Telephone Encounter (Signed)
 Tried to reach out to patient via telephone and also tried to call son as well. Both vm are full so I couldn't leave a message. I sent a mychart message and requested a message in return to acknowledge receipt of Eliquis  hold instructions.

## 2024-03-09 ENCOUNTER — Ambulatory Visit (INDEPENDENT_AMBULATORY_CARE_PROVIDER_SITE_OTHER): Admitting: Family Medicine

## 2024-03-09 ENCOUNTER — Encounter: Payer: Self-pay | Admitting: Family Medicine

## 2024-03-09 VITALS — BP 143/67 | HR 87 | Wt 150.8 lb

## 2024-03-09 DIAGNOSIS — N1831 Chronic kidney disease, stage 3a: Secondary | ICD-10-CM | POA: Diagnosis not present

## 2024-03-09 DIAGNOSIS — J439 Emphysema, unspecified: Secondary | ICD-10-CM

## 2024-03-09 DIAGNOSIS — E1122 Type 2 diabetes mellitus with diabetic chronic kidney disease: Secondary | ICD-10-CM

## 2024-03-09 DIAGNOSIS — D649 Anemia, unspecified: Secondary | ICD-10-CM

## 2024-03-09 DIAGNOSIS — W19XXXA Unspecified fall, initial encounter: Secondary | ICD-10-CM | POA: Diagnosis not present

## 2024-03-09 DIAGNOSIS — I10 Essential (primary) hypertension: Secondary | ICD-10-CM | POA: Diagnosis not present

## 2024-03-09 DIAGNOSIS — I480 Paroxysmal atrial fibrillation: Secondary | ICD-10-CM

## 2024-03-09 MED ORDER — BLOOD GLUCOSE TEST VI STRP: 1.0000 | ORAL_STRIP | Freq: Every day | 0 refills | Status: AC | PRN

## 2024-03-09 MED ORDER — LANCET DEVICE MISC: 1.0000 | Freq: Every day | 0 refills | Status: AC | PRN

## 2024-03-09 MED ORDER — LEVALBUTEROL HCL 0.63 MG/3ML IN NEBU: 0.6300 mg | INHALATION_SOLUTION | RESPIRATORY_TRACT | 3 refills | Status: AC | PRN

## 2024-03-09 MED ORDER — BLOOD GLUCOSE MONITORING SUPPL DEVI: 1.0000 | Freq: Every day | 0 refills | Status: AC | PRN

## 2024-03-09 MED ORDER — LANCETS MISC. MISC: 1.0000 | Freq: Every day | 0 refills | Status: AC | PRN

## 2024-03-09 NOTE — Assessment & Plan Note (Addendum)
 Trial xopenex  neb given increased HR with albuterol .

## 2024-03-09 NOTE — Assessment & Plan Note (Addendum)
 Doing well s/p fall. No sustained injuries or head trauma.

## 2024-03-09 NOTE — Patient Instructions (Addendum)
 It was great to see you!  Our plans for today:  - continue with your blood pressure medications as you have been. - I recommend proceeding with your procedure. It is lower risk to hold eliquis  only for 2 days and then restart it after the procedure. - Check your blood sugar when you feel jittery in the morning. I sent a prescription to your pharmacy.  - Try xopenex  instead of albuterol  when you are wheezing to keep your heart rate from going so high.  - Come back in about 6 months.   Take care and seek immediate care sooner if you develop any concerns.   Dr. Kaylanie Capili

## 2024-03-09 NOTE — Assessment & Plan Note (Addendum)
 Await upcoming EGD/colonoscopy. Continues on xarelto given afib.

## 2024-03-09 NOTE — Assessment & Plan Note (Addendum)
 A1c 6.6, diet controlled. Rx blood sugar meter to check CBGs during jittery episodes to r/o hypoglycemia.

## 2024-03-09 NOTE — Assessment & Plan Note (Addendum)
 Doing well on current regimen, no changes made today. Updated med list to reflect current dosing.

## 2024-03-09 NOTE — Assessment & Plan Note (Addendum)
 Seems to be in afib today though rate controlled. Plan to continue eliquis  with brief interruption for EGD/colonoscopy. Resume immediately after.

## 2024-03-09 NOTE — Progress Notes (Signed)
  Date of Visit: 03/09/2024   SUBJECTIVE:   HPI:  Discussed the use of AI scribe software for clinical note transcription with the patient, who gave verbal consent to proceed.  History of Present Illness Tarence Searcy. is a 78 year old male with atrial fibrillation who presents with concerns about stopping Eliquis  for an upcoming EGD procedure.  Anticoagulation management and procedural concerns - Paroxysmal Atrial fibrillation managed with Eliquis  (apixaban ) - Upcoming EGD/colonoscopy procedure requiring discontinuation of Eliquis  for two days - Apprehension about stopping Eliquis  due to strong family history of strokes (parents and son affected) - EGD/colonoscopy to evaluate for IDA, most recent Hb 11, baseline around 13-14. - needs to schedule Pulm appt for clearance - planning to perform procedure at hospital given COPD on O2  Gait instability and recent fall - Recent fall while taking dog outside, with leg giving way and fall from porch step - No loss of consciousness or head trauma - Finger injury and scar sustained - Ambulating without difficulty since the fall  Blood pressure concern - Concern about low blood pressure - Self-reduced losartan  dose from 100 mg to 50 mg due to hypotension, noted 80 systolic once - Continues hydrochlorothiazide  12.5 mg and bisoprolol  5 mg  Cardiac symptoms and medication effects - Increased heart rate and episodes of atrial fibrillation with albuterol  treatments - Discontinued use of albuterol  nebulizer due to these symptoms  Hypoglycemic symptoms - Intermittent sensation of feeling 'jittery', suspected to be related to blood glucose levels - Symptom alleviated by drinking coffee sweetened with sugar - No functioning blood glucose meter at home    OBJECTIVE:   BP (!) 143/67   Pulse 87   Wt 150 lb 12.8 oz (68.4 kg)   SpO2 100%   BMI 21.03 kg/m  Gen: well appearing, in NAD Card: distant heart sounds. Pulse irregular but normal  rate. Lungs: distant breath sounds. Minimal wheezing. On home supplemental O2. Ext: WWP, no edema   ASSESSMENT/PLAN:   Assessment & Plan PAF (paroxysmal atrial fibrillation) (HCC) Seems to be in afib today though rate controlled. Plan to continue eliquis  with brief interruption for EGD/colonoscopy. Resume immediately after. HYPERTENSION, BENIGN SYSTEMIC Doing well on current regimen, no changes made today. Updated med list to reflect current dosing. Pulmonary emphysema, unspecified emphysema type (HCC) Trial xopenex  neb given increased HR with albuterol . Anemia, unspecified type Await upcoming EGD/colonoscopy. Continues on xarelto given afib. Type 2 diabetes mellitus with stage 3a chronic kidney disease, without long-term current use of insulin (HCC) A1c 6.6, diet controlled. Rx blood sugar meter to check CBGs during jittery episodes to r/o hypoglycemia.  Fall, initial encounter Doing well s/p fall. No sustained injuries or head trauma.   Assessment & Plan   F/u 3-6 months.  Donald Lai, DO 03/09/2024 11:56 AM

## 2024-03-15 ENCOUNTER — Other Ambulatory Visit: Payer: Self-pay | Admitting: Family Medicine

## 2024-03-21 DIAGNOSIS — J449 Chronic obstructive pulmonary disease, unspecified: Secondary | ICD-10-CM | POA: Diagnosis not present

## 2024-03-26 DIAGNOSIS — H401131 Primary open-angle glaucoma, bilateral, mild stage: Secondary | ICD-10-CM | POA: Diagnosis not present

## 2024-04-03 ENCOUNTER — Encounter: Payer: Self-pay | Admitting: Gastroenterology

## 2024-04-04 ENCOUNTER — Telehealth: Payer: Self-pay | Admitting: Gastroenterology

## 2024-04-04 MED ORDER — PEG-KCL-NACL-NASULF-NA ASC-C 100 G PO SOLR
1.0000 | Freq: Once | ORAL | 0 refills | Status: AC
Start: 2024-04-04 — End: 2024-04-04

## 2024-04-04 NOTE — Telephone Encounter (Addendum)
 Procedure:Colonoscopy/Endoscopy Procedure date: 04/12/24 Procedure location: [facility] Arrival Time: 8:00 am Spoke with the patient Y/N: Yes Any prep concerns? No  Has the patient obtained the prep from the pharmacy ? No, Moviprep resent to [facility] Do you have a care partner and transportation: Yes Any additional concerns? No

## 2024-04-05 ENCOUNTER — Encounter (HOSPITAL_COMMUNITY): Payer: Self-pay | Admitting: Gastroenterology

## 2024-04-05 ENCOUNTER — Other Ambulatory Visit: Payer: Self-pay

## 2024-04-05 NOTE — Progress Notes (Addendum)
 Anesthesia Review:  PCP: Rumball LOV 03/09/24  Cardiologist : [facility] [place]- Dr Kara LOV 11/10/23.   PPM/ ICD: Device Orders: Rep Notified:  Chest x-ray : EKG : Echo : Stress test: Cardiac Cath :   Activity level:  cannot do a flight of stairs without difficutly  Sleep Study/ CPAP : none  Fasting Blood Sugar :      / Checks Blood Sugar -- times a day:    Prediabetes- checks glucose every day at home  On no meds    Blood Thinner/ Instructions /Last Dose: ASA / Instructions/ Last Dose :    Eliquis  - stop 2 days prior per pt Last dose on 04/09/24  [phone] mailbox full on 04/05/24. Unable to LVMM. PT called back within a few minutes and wanted preop nurse to call him back in an hour or so.  Informed pt that would be done.    Oxygen  24/7 at 2L  Can walk short distances    Med hx and preop instructions completed on 04/05/2024.  PT voiced understanding.   PT stated at time of phone call - What do I do if I I go into atrial fib.  Prior to coming to hospital.  Instructed pt to call 911 if needed, cardiolgist if needed, and to call [phone].  PT voiced understanding.  Pt staes that cardiolgoist told him not to have procedure done if he does into atrial fib.     Harlene Neomi Barre aware of pt Has clearance per Harlene.

## 2024-04-12 ENCOUNTER — Ambulatory Visit (HOSPITAL_COMMUNITY): Admission: RE | Admit: 2024-04-12 | Source: Home / Self Care | Admitting: Gastroenterology

## 2024-04-12 ENCOUNTER — Encounter (HOSPITAL_COMMUNITY): Payer: Self-pay | Admitting: Certified Registered Nurse Anesthetist

## 2024-04-12 ENCOUNTER — Telehealth: Payer: Self-pay | Admitting: Gastroenterology

## 2024-04-12 ENCOUNTER — Other Ambulatory Visit: Payer: Self-pay | Admitting: Family Medicine

## 2024-04-12 DIAGNOSIS — J439 Emphysema, unspecified: Secondary | ICD-10-CM

## 2024-04-12 HISTORY — DX: Pneumonia, unspecified organism: J18.9

## 2024-04-12 HISTORY — DX: Anemia, unspecified: D64.9

## 2024-04-12 HISTORY — DX: Prediabetes: R73.03

## 2024-04-12 HISTORY — DX: Anxiety disorder, unspecified: F41.9

## 2024-04-12 SURGERY — COLONOSCOPY
Anesthesia: Monitor Anesthesia Care

## 2024-04-12 NOTE — Telephone Encounter (Signed)
 Michael Robertson. was scheduled for Colonoscopy and esophagogastroduodenscopy with Dr. Charlanne on Thursday, April 06, 2024, at 0930 hospital.   Called patient when did not show at 0800, patient states cancelled with office secondary to going into afib.  Dr Charlanne notified. Telephone note found from office stating same.

## 2024-04-12 NOTE — Anesthesia Preprocedure Evaluation (Signed)
 Anesthesia Evaluation    Airway        Dental   Pulmonary COPD,  oxygen  dependent, former smoker          Cardiovascular hypertension, + dysrhythmias Atrial Fibrillation      Neuro/Psych    GI/Hepatic ,GERD  ,,(+)     substance abuse    Endo/Other  diabetes    Renal/GU Renal InsufficiencyRenal disease     Musculoskeletal   Abdominal   Peds  Hematology   Anesthesia Other Findings   Reproductive/Obstetrics                              Anesthesia Physical Anesthesia Plan  ASA: 3  Anesthesia Plan: MAC   Post-op Pain Management:    Induction: Intravenous  PONV Risk Score and Plan:   Airway Management Planned: Simple Face Mask  Additional Equipment:   Intra-op Plan:   Post-operative Plan:   Informed Consent:   Plan Discussed with: CRNA and Surgeon  Anesthesia Plan Comments:          Anesthesia Quick Evaluation

## 2024-04-12 NOTE — Telephone Encounter (Signed)
 Inbound call from patient stating he is going to have to cancel procedure that he has at [facility] at 9:30am due to his heart being in Afib. Patient states he tried canceling earlier but wasn't sure if they canceled it. Please advise  Thank you

## 2024-04-13 NOTE — Telephone Encounter (Signed)
 Called patient per Dr. Roark request to clarify if patient still need the medication Guaifen & Codeine  solution.   Patient stated that he is still taking that medication and would need it refilled.  Harlene Reiter, CMA

## 2024-04-20 DIAGNOSIS — J449 Chronic obstructive pulmonary disease, unspecified: Secondary | ICD-10-CM | POA: Diagnosis not present

## 2024-05-03 ENCOUNTER — Other Ambulatory Visit: Payer: Self-pay | Admitting: Pulmonary Disease

## 2024-05-03 DIAGNOSIS — J449 Chronic obstructive pulmonary disease, unspecified: Secondary | ICD-10-CM

## 2024-05-08 ENCOUNTER — Emergency Department (HOSPITAL_COMMUNITY)

## 2024-05-08 ENCOUNTER — Other Ambulatory Visit: Payer: Self-pay

## 2024-05-08 ENCOUNTER — Encounter (HOSPITAL_COMMUNITY): Payer: Self-pay

## 2024-05-08 ENCOUNTER — Inpatient Hospital Stay (HOSPITAL_COMMUNITY)
Admission: EM | Admit: 2024-05-08 | Discharge: 2024-05-12 | DRG: 872 | Disposition: A | Discharge status: Home / Self Care

## 2024-05-08 DIAGNOSIS — I480 Paroxysmal atrial fibrillation: Secondary | ICD-10-CM | POA: Diagnosis present

## 2024-05-08 DIAGNOSIS — Z1152 Encounter for screening for COVID-19: Secondary | ICD-10-CM | POA: Diagnosis not present

## 2024-05-08 DIAGNOSIS — Z792 Long term (current) use of antibiotics: Secondary | ICD-10-CM | POA: Diagnosis not present

## 2024-05-08 DIAGNOSIS — Z881 Allergy status to other antibiotic agents status: Secondary | ICD-10-CM | POA: Diagnosis not present

## 2024-05-08 DIAGNOSIS — I471 Supraventricular tachycardia, unspecified: Secondary | ICD-10-CM | POA: Diagnosis present

## 2024-05-08 DIAGNOSIS — Z7952 Long term (current) use of systemic steroids: Secondary | ICD-10-CM

## 2024-05-08 DIAGNOSIS — R509 Fever, unspecified: Secondary | ICD-10-CM | POA: Diagnosis not present

## 2024-05-08 DIAGNOSIS — A419 Sepsis, unspecified organism: Secondary | ICD-10-CM | POA: Diagnosis not present

## 2024-05-08 DIAGNOSIS — J439 Emphysema, unspecified: Secondary | ICD-10-CM | POA: Diagnosis present

## 2024-05-08 DIAGNOSIS — R531 Weakness: Secondary | ICD-10-CM | POA: Diagnosis not present

## 2024-05-08 DIAGNOSIS — J449 Chronic obstructive pulmonary disease, unspecified: Secondary | ICD-10-CM | POA: Diagnosis present

## 2024-05-08 DIAGNOSIS — E872 Acidosis, unspecified: Secondary | ICD-10-CM | POA: Diagnosis present

## 2024-05-08 DIAGNOSIS — Z7901 Long term (current) use of anticoagulants: Secondary | ICD-10-CM

## 2024-05-08 DIAGNOSIS — N281 Cyst of kidney, acquired: Secondary | ICD-10-CM | POA: Diagnosis not present

## 2024-05-08 DIAGNOSIS — Z23 Encounter for immunization: Secondary | ICD-10-CM

## 2024-05-08 DIAGNOSIS — I4891 Unspecified atrial fibrillation: Secondary | ICD-10-CM | POA: Diagnosis present

## 2024-05-08 DIAGNOSIS — R000 Tachycardia, unspecified: Secondary | ICD-10-CM | POA: Diagnosis not present

## 2024-05-08 DIAGNOSIS — I959 Hypotension, unspecified: Secondary | ICD-10-CM | POA: Diagnosis not present

## 2024-05-08 DIAGNOSIS — E785 Hyperlipidemia, unspecified: Secondary | ICD-10-CM | POA: Diagnosis present

## 2024-05-08 DIAGNOSIS — K219 Gastro-esophageal reflux disease without esophagitis: Secondary | ICD-10-CM | POA: Diagnosis present

## 2024-05-08 DIAGNOSIS — D649 Anemia, unspecified: Secondary | ICD-10-CM | POA: Diagnosis present

## 2024-05-08 DIAGNOSIS — Z789 Other specified health status: Secondary | ICD-10-CM

## 2024-05-08 DIAGNOSIS — E860 Dehydration: Secondary | ICD-10-CM | POA: Diagnosis not present

## 2024-05-08 DIAGNOSIS — N183 Chronic kidney disease, stage 3 unspecified: Secondary | ICD-10-CM | POA: Diagnosis present

## 2024-05-08 DIAGNOSIS — R110 Nausea: Secondary | ICD-10-CM | POA: Diagnosis present

## 2024-05-08 DIAGNOSIS — K429 Umbilical hernia without obstruction or gangrene: Secondary | ICD-10-CM | POA: Diagnosis not present

## 2024-05-08 DIAGNOSIS — I129 Hypertensive chronic kidney disease with stage 1 through stage 4 chronic kidney disease, or unspecified chronic kidney disease: Secondary | ICD-10-CM | POA: Diagnosis present

## 2024-05-08 DIAGNOSIS — N400 Enlarged prostate without lower urinary tract symptoms: Secondary | ICD-10-CM | POA: Diagnosis present

## 2024-05-08 DIAGNOSIS — Z9981 Dependence on supplemental oxygen: Secondary | ICD-10-CM | POA: Diagnosis not present

## 2024-05-08 DIAGNOSIS — Z79899 Other long term (current) drug therapy: Secondary | ICD-10-CM

## 2024-05-08 DIAGNOSIS — E1122 Type 2 diabetes mellitus with diabetic chronic kidney disease: Secondary | ICD-10-CM | POA: Diagnosis present

## 2024-05-08 LAB — URINALYSIS, W/ REFLEX TO CULTURE (INFECTION SUSPECTED)
Bilirubin Urine: NEGATIVE
Glucose, UA: NEGATIVE mg/dL
Hgb urine dipstick: NEGATIVE
Ketones, ur: NEGATIVE mg/dL
Leukocytes,Ua: NEGATIVE
Nitrite: NEGATIVE
Protein, ur: NEGATIVE mg/dL
Specific Gravity, Urine: 1.021 (ref 1.005–1.030)
pH: 5 (ref 5.0–8.0)

## 2024-05-08 LAB — I-STAT VENOUS BLOOD GAS, ED
Acid-Base Excess: 0 mmol/L (ref 0.0–2.0)
Bicarbonate: 27 mmol/L (ref 20.0–28.0)
Calcium, Ion: 1.09 mmol/L — ABNORMAL LOW (ref 1.15–1.40)
HCT: 41 % (ref 39.0–52.0)
Hemoglobin: 13.9 g/dL (ref 13.0–17.0)
O2 Saturation: 77 %
Potassium: 3.6 mmol/L (ref 3.5–5.1)
Sodium: 137 mmol/L (ref 135–145)
TCO2: 28 mmol/L (ref 22–32)
pCO2, Ven: 51.1 mmHg (ref 44–60)
pH, Ven: 7.33 (ref 7.25–7.43)
pO2, Ven: 45 mmHg (ref 32–45)

## 2024-05-08 LAB — CBC WITH DIFFERENTIAL/PLATELET
Basophils Absolute: 0 K/uL (ref 0.0–0.1)
Basophils Relative: 0 %
Eosinophils Absolute: 0 K/uL (ref 0.0–0.5)
Eosinophils Relative: 0 %
HCT: 41.4 % (ref 39.0–52.0)
Hemoglobin: 12.8 g/dL — ABNORMAL LOW (ref 13.0–17.0)
Lymphocytes Relative: 3 %
Lymphs Abs: 0.9 K/uL (ref 0.7–4.0)
MCH: 30.3 pg (ref 26.0–34.0)
MCHC: 30.9 g/dL (ref 30.0–36.0)
MCV: 98.1 fL (ref 80.0–100.0)
Monocytes Absolute: 1.7 K/uL — ABNORMAL HIGH (ref 0.1–1.0)
Monocytes Relative: 6 %
Neutro Abs: 26.1 K/uL — ABNORMAL HIGH (ref 1.7–7.7)
Neutrophils Relative %: 91 %
Platelets: 329 K/uL (ref 150–400)
RBC: 4.22 MIL/uL (ref 4.22–5.81)
RDW: 15.1 % (ref 11.5–15.5)
WBC: 28.7 K/uL — ABNORMAL HIGH (ref 4.0–10.5)
nRBC: 0 % (ref 0.0–0.2)

## 2024-05-08 LAB — RESP PANEL BY RT-PCR (RSV, FLU A&B, COVID)  RVPGX2
Influenza A by PCR: NEGATIVE
Influenza B by PCR: NEGATIVE
Resp Syncytial Virus by PCR: NEGATIVE
SARS Coronavirus 2 by RT PCR: NEGATIVE

## 2024-05-08 LAB — COMPREHENSIVE METABOLIC PANEL WITH GFR
ALT: 25 U/L (ref 0–44)
AST: 19 U/L (ref 15–41)
Albumin: 3.2 g/dL — ABNORMAL LOW (ref 3.5–5.0)
Alkaline Phosphatase: 36 U/L — ABNORMAL LOW (ref 38–126)
Anion gap: 10 (ref 5–15)
BUN: 25 mg/dL — ABNORMAL HIGH (ref 8–23)
CO2: 24 mmol/L (ref 22–32)
Calcium: 8.5 mg/dL — ABNORMAL LOW (ref 8.9–10.3)
Chloride: 101 mmol/L (ref 98–111)
Creatinine, Ser: 1.31 mg/dL — ABNORMAL HIGH (ref 0.61–1.24)
GFR, Estimated: 55 mL/min — ABNORMAL LOW (ref >60)
Glucose, Bld: 120 mg/dL — ABNORMAL HIGH (ref 70–99)
Potassium: 3.6 mmol/L (ref 3.5–5.1)
Sodium: 135 mmol/L (ref 135–145)
Total Bilirubin: 1.1 mg/dL (ref 0.0–1.2)
Total Protein: 5.8 g/dL — ABNORMAL LOW (ref 6.5–8.1)

## 2024-05-08 LAB — I-STAT CG4 LACTIC ACID, ED
Lactic Acid, Venous: 1.6 mmol/L (ref 0.5–1.9)
Lactic Acid, Venous: 2.2 mmol/L (ref 0.5–1.9)
Lactic Acid, Venous: 2.3 mmol/L (ref 0.5–1.9)
Lactic Acid, Venous: 1.3 mmol/L (ref 0.5–1.9)

## 2024-05-08 LAB — D-DIMER, QUANTITATIVE: D-Dimer, Quant: <0.27 ug{FEU}/mL (ref 0.00–0.50)

## 2024-05-08 LAB — TROPONIN I (HIGH SENSITIVITY)
Troponin I (High Sensitivity): 36 ng/L — ABNORMAL HIGH (ref <18)
Troponin I (High Sensitivity): 39 ng/L — ABNORMAL HIGH (ref <18)

## 2024-05-08 MED ORDER — IOHEXOL 350 MG/ML SOLN
75.0000 mL | Freq: Once | INTRAVENOUS | Status: AC | PRN
  Administered 2024-05-08: 75 mL via INTRAVENOUS

## 2024-05-08 MED ORDER — SODIUM CHLORIDE 0.9 % IV SOLN
1.0000 g | Freq: Once | INTRAVENOUS | Status: AC
  Administered 2024-05-08: 1 g via INTRAVENOUS
  Filled 2024-05-08: qty 10

## 2024-05-08 MED ORDER — AZITHROMYCIN 500 MG PO TABS
500.0000 mg | ORAL_TABLET | ORAL | Status: DC
Start: 2024-05-09 — End: 2024-05-10
  Administered 2024-05-09: 500 mg via ORAL
  Filled 2024-05-08: qty 2

## 2024-05-08 MED ORDER — VITAMIN D 25 MCG (1000 UNIT) PO TABS
1000.0000 [IU] | ORAL_TABLET | Freq: Every day | ORAL | Status: DC
  Administered 2024-05-09 – 2024-05-12 (×4): 1000 [IU] via ORAL
  Filled 2024-05-08 (×4): qty 1

## 2024-05-08 MED ORDER — MONTELUKAST SODIUM 10 MG PO TABS
10.0000 mg | ORAL_TABLET | Freq: Every day | ORAL | Status: DC
  Administered 2024-05-09 – 2024-05-12 (×4): 10 mg via ORAL
  Filled 2024-05-08 (×4): qty 1

## 2024-05-08 MED ORDER — ALBUTEROL SULFATE (2.5 MG/3ML) 0.083% IN NEBU: 2.5000 mg | INHALATION_SOLUTION | Freq: Four times a day (QID) | RESPIRATORY_TRACT | Status: DC | PRN

## 2024-05-08 MED ORDER — ROSUVASTATIN CALCIUM 20 MG PO TABS
20.0000 mg | ORAL_TABLET | Freq: Every day | ORAL | Status: DC
  Administered 2024-05-09 – 2024-05-12 (×4): 20 mg via ORAL
  Filled 2024-05-08 (×4): qty 1

## 2024-05-08 MED ORDER — BUDESONIDE 0.5 MG/2ML IN SUSP
0.5000 mg | Freq: Two times a day (BID) | RESPIRATORY_TRACT | Status: DC
  Administered 2024-05-09 – 2024-05-11 (×5): 0.5 mg via RESPIRATORY_TRACT
  Filled 2024-05-08 (×5): qty 2

## 2024-05-08 MED ORDER — FERROUS GLUCONATE 324 (38 FE) MG PO TABS
324.0000 mg | ORAL_TABLET | Freq: Every day | ORAL | Status: DC
  Administered 2024-05-09 – 2024-05-12 (×4): 324 mg via ORAL
  Filled 2024-05-08 (×5): qty 1

## 2024-05-08 MED ORDER — PANTOPRAZOLE SODIUM 40 MG PO TBEC
40.0000 mg | DELAYED_RELEASE_TABLET | Freq: Every day | ORAL | Status: DC
  Administered 2024-05-09 – 2024-05-12 (×4): 40 mg via ORAL
  Filled 2024-05-08 (×4): qty 1

## 2024-05-08 MED ORDER — SODIUM CHLORIDE 0.9 % IV BOLUS
500.0000 mL | Freq: Once | INTRAVENOUS | Status: AC
Start: 2024-05-08 — End: 2024-05-08
  Administered 2024-05-08: 500 mL via INTRAVENOUS

## 2024-05-08 MED ORDER — BISOPROLOL FUMARATE 5 MG PO TABS
5.0000 mg | ORAL_TABLET | Freq: Every day | ORAL | Status: DC
Start: 2024-05-09 — End: 2024-05-09
  Filled 2024-05-08: qty 1

## 2024-05-08 MED ORDER — SODIUM CHLORIDE 0.9 % IV BOLUS
1000.0000 mL | Freq: Once | INTRAVENOUS | Status: AC
  Administered 2024-05-08: 1000 mL via INTRAVENOUS

## 2024-05-08 MED ORDER — FINASTERIDE 5 MG PO TABS
5.0000 mg | ORAL_TABLET | Freq: Every day | ORAL | Status: DC
  Administered 2024-05-09 – 2024-05-12 (×4): 5 mg via ORAL
  Filled 2024-05-08 (×4): qty 1

## 2024-05-08 MED ORDER — TAMSULOSIN HCL 0.4 MG PO CAPS
0.8000 mg | ORAL_CAPSULE | Freq: Every day | ORAL | Status: DC
  Administered 2024-05-09 – 2024-05-10 (×2): 0.8 mg via ORAL
  Filled 2024-05-08 (×3): qty 2

## 2024-05-08 MED ORDER — ACETAMINOPHEN 325 MG PO TABS
650.0000 mg | ORAL_TABLET | Freq: Four times a day (QID) | ORAL | Status: DC | PRN
  Administered 2024-05-10 – 2024-05-12 (×2): 650 mg via ORAL
  Filled 2024-05-08 (×2): qty 2

## 2024-05-08 MED ORDER — SODIUM CHLORIDE 0.9 % IV SOLN
500.0000 mg | Freq: Once | INTRAVENOUS | Status: AC
  Administered 2024-05-08: 500 mg via INTRAVENOUS
  Filled 2024-05-08: qty 5

## 2024-05-08 MED ORDER — HYDROCHLOROTHIAZIDE 12.5 MG PO TABS
12.5000 mg | ORAL_TABLET | Freq: Every day | ORAL | Status: DC
Start: 2024-05-09 — End: 2024-05-09

## 2024-05-08 MED ORDER — TRAZODONE HCL 100 MG PO TABS
100.0000 mg | ORAL_TABLET | Freq: Every day | ORAL | Status: DC
  Administered 2024-05-09 – 2024-05-11 (×4): 100 mg via ORAL
  Filled 2024-05-08: qty 2
  Filled 2024-05-08: qty 1
  Filled 2024-05-08: qty 2
  Filled 2024-05-08: qty 1

## 2024-05-08 MED ORDER — APIXABAN 5 MG PO TABS
5.0000 mg | ORAL_TABLET | Freq: Two times a day (BID) | ORAL | Status: DC
  Administered 2024-05-09 – 2024-05-12 (×8): 5 mg via ORAL
  Filled 2024-05-08 (×8): qty 1

## 2024-05-08 MED ORDER — ACETAMINOPHEN 650 MG RE SUPP: 650.0000 mg | Freq: Four times a day (QID) | RECTAL | Status: DC | PRN

## 2024-05-08 MED ORDER — LOSARTAN POTASSIUM 50 MG PO TABS: 50.0000 mg | ORAL_TABLET | Freq: Every day | ORAL | Status: DC

## 2024-05-08 MED ORDER — PREDNISONE 10 MG PO TABS
10.0000 mg | ORAL_TABLET | Freq: Every day | ORAL | Status: DC
  Administered 2024-05-09 – 2024-05-12 (×4): 10 mg via ORAL
  Filled 2024-05-08 (×4): qty 1

## 2024-05-08 MED ORDER — ADULT MULTIVITAMIN W/MINERALS CH
1.0000 | ORAL_TABLET | Freq: Every day | ORAL | Status: DC
Start: 2024-05-09 — End: 2024-05-12
  Administered 2024-05-09 – 2024-05-12 (×4): 1 via ORAL
  Filled 2024-05-08 (×5): qty 1

## 2024-05-08 NOTE — ED Provider Notes (Signed)
 Received patient in turnover from Dr. Jerrol.  Please see their note for further details of Hx, PE.  Briefly patient is a 78 y.o. male with a No chief complaint on file. .  Patient found to be fatigued and weak.  Hypotensive as well.  Reportedly had a fever at home as high as 102.  Plan to screen for sepsis.  Likely admission.  CT chest on pelvis without obvious source of infection.  COVID flu and RSV are negative.  Lactate without obvious improvement with 500 cc bolus.  Looks dehydrated on my exam will give a liter.  Discussed with family medicine for admission.    Emil Share, DO 05/08/24 1943

## 2024-05-08 NOTE — ED Triage Notes (Signed)
 Pt bib ems form home; c/o generalized weakness since this am; endorses afib this am, took medication; 78/42 on ems arrival; 500 cc fluid given pta ; 116/58; c/o nausea; took 4 of zofran , nausea improved; endorses tenderness to L chest; 18 GA LFA; HR 90, NSR; endorses fever 102 earlier

## 2024-05-08 NOTE — ED Provider Notes (Signed)
 [place] EMERGENCY DEPARTMENT AT [facility] Provider Note   CSN: [id number] Arrival date & time: 05/08/24  1402     Patient presents with: No chief complaint on file.   Michael Robertson Daryll Spisak. is a 78 y.o. male.   HPI   78 year old male with medical history significant for COPD, CKD, chronic respiratory with hypoxia on 2 L O2 via nasal cannula at baseline, DM 2, paroxysmal atrial fibrillation on Eliquis  who presents to the emergency department with fatigue and generalized weakness that started this morning.  Reportedly had been in A-fib this morning.  He took his home blood pressure medication and then EMS was called out due to profound weakness.  EMS found the patient to be hypotensive BP 78/42.  He was administered 500 cc of fluid with improvement in his blood pressure to 116/58.  He complains of mild nausea, denies any chest pain.  Had some chest wall discomfort.  He felt like it was may be related to his COPD, described a dull ache in the right chest wall.  His nausea has since improved after 4 of Zofran .  He endorses fever to 102 earlier today.   Prior to Admission medications   Medication Sig Start Date End Date Taking? Authorizing Provider  apixaban  (ELIQUIS ) 5 MG TABS tablet Take 1 tablet (5 mg total) by mouth 2 (two) times daily. 09/08/23 09/02/24  Madelon Donald HERO, DO  azithromycin  (ZITHROMAX ) 500 MG tablet Take 1 tablet (500 mg total) by mouth every Monday, Wednesday, and Friday. 11/11/23   Kara Dorn NOVAK, MD  bisoprolol  (ZEBETA ) 5 MG tablet TAKE 1 TABLET DAILY 09/24/19   Scarlet Elsie LABOR, MD  Blood Glucose Monitoring Suppl DEVI 1 each by Does not apply route daily as needed. May substitute to any manufacturer covered by patient's insurance. 03/09/24   Rumball, Alison M, DO  cholecalciferol (VITAMIN D) 1000 units tablet Take 1,000 Units by mouth daily.    [provider]  ferrous gluconate  (FERGON) 324 MG tablet Take 1 tablet (324 mg total) by mouth daily. 02/02/24    May, Deanna J, NP  ferrous sulfate  325 (65 FE) MG tablet Take 1 tablet (325 mg total) by mouth daily with breakfast. 02/03/24   May, Deanna J, NP  finasteride  (PROSCAR ) 5 MG tablet Take 1 tablet (5 mg total) by mouth daily. 12/05/23   Rumball, Alison M, DO  guaiFENesin -codeine  100-10 MG/5ML syrup TAKE 5 ML EVERY 6 HOURS AS NEEDED FOR COUGH 04/13/24   Rumball, Alison M, DO  hydrochlorothiazide  (HYDRODIURIL ) 12.5 MG tablet TAKE 1 TABLET DAILY 03/15/24   Rumball, Alison M, DO  Lancet Device MISC 1 each by Does not apply route daily as needed. May substitute to any manufacturer covered by patient's insurance. 03/09/24   Rumball, Alison M, DO  levalbuterol  (XOPENEX ) 0.63 MG/3ML nebulizer solution Take 3 mLs (0.63 mg total) by nebulization every 4 (four) hours as needed for wheezing or shortness of breath. 03/09/24   Madelon Donald HERO, DO  losartan  (COZAAR ) 100 MG tablet Take 50 mg by mouth daily. 05/23/23   [provider]  mometasone  (ASMANEX ) 220 MCG/ACT inhaler Inhale 2 puffs into the lungs daily. 11/10/23   Kara Dorn NOVAK, MD  montelukast  (SINGULAIR ) 10 MG tablet TAKE 1 TABLET AT BEDTIME 05/04/24   Dewald, Jonathan B, MD  Multiple Vitamins-Minerals (CENTRUM SILVER ADULT 50+ PO) Take 1 tablet by mouth daily.     [provider]  omeprazole  (PRILOSEC) 40 MG capsule Take 1 capsule (40 mg  total) by mouth daily. 07/14/23   Rumball, Alison M, DO  predniSONE  (DELTASONE ) 10 MG tablet Take 1 tablet (10 mg total) by mouth daily with breakfast. 11/10/23   Kara Dorn NOVAK, MD  predniSONE  (DELTASONE ) 50 MG tablet TAKE 1 TABLET DAILY FOR 5 DAYS AS NEEDED FOR A CHRONIC OBSTRUCTIVE PULMONARY DISEASE EXACERBATION 02/08/24   Madelon Donald HERO, DO  rosuvastatin  (CRESTOR ) 20 MG tablet TAKE 1 TABLET DAILY 12/15/23   Donzetta Rollene BRAVO, MD  tamsulosin  (FLOMAX ) 0.4 MG CAPS capsule TAKE 2 CAPSULES DAILY AFTER SUPPER 02/16/24   Rumball, Alison M, DO  Tiotropium Bromide -Olodaterol 2.5-2.5 MCG/ACT AERS Inhale 2  puffs into the lungs daily. 11/10/23   Kara Dorn NOVAK, MD  traZODone  (DESYREL ) 100 MG tablet TAKE 1 TABLET AT BEDTIME 01/16/24   Rumball, Alison M, DO  VENTOLIN  HFA 108 (90 Base) MCG/ACT inhaler USE 2 INHALATIONS EVERY 4 HOURS AS NEEDED. RESCUE FOR SHORTNESS OF BREATH 08/19/15   Rosalynn Camie CROME, MD    Allergies: Doxycycline    Review of Systems  All other systems reviewed and are negative.   Updated Vital Signs BP (!) 96/57 (BP Location: Right Arm)   Pulse 98   Temp 98.5 F (36.9 C) (Oral)   Resp 18   SpO2 98%   Physical Exam Vitals and nursing note reviewed.  Constitutional:      General: He is not in acute distress. HENT:     Head: Normocephalic and atraumatic.  Eyes:     Conjunctiva/sclera: Conjunctivae normal.     Pupils: Pupils are equal, round, and reactive to light.  Cardiovascular:     Rate and Rhythm: Regular rhythm. Tachycardia present.  Pulmonary:     Effort: Pulmonary effort is normal. No respiratory distress.  Abdominal:     General: There is no distension.     Tenderness: There is no guarding.  Musculoskeletal:        General: No deformity or signs of injury.     Cervical back: Neck supple.  Skin:    Findings: No lesion or rash.  Neurological:     General: No focal deficit present.     Mental Status: He is alert. Mental status is at baseline.     Cranial Nerves: No cranial nerve deficit.     Sensory: No sensory deficit.     Motor: No weakness.     (all labs ordered are listed, but only abnormal results are displayed) Labs Reviewed  COMPREHENSIVE METABOLIC PANEL WITH GFR - Abnormal; Notable for the following components:      Result Value   Glucose, Bld 120 (*)    BUN 25 (*)    Creatinine, Ser 1.31 (*)    Calcium  8.5 (*)    Total Protein 5.8 (*)    Albumin 3.2 (*)    Alkaline Phosphatase 36 (*)    GFR, Estimated 55 (*)    All other components within normal limits  CBC WITH DIFFERENTIAL/PLATELET - Abnormal; Notable for the following components:    WBC 28.7 (*)    Hemoglobin 12.8 (*)    Neutro Abs 26.1 (*)    Monocytes Absolute 1.7 (*)    All other components within normal limits  I-STAT VENOUS BLOOD GAS, ED - Abnormal; Notable for the following components:   Calcium , Ion 1.09 (*)    All other components within normal limits  I-STAT CG4 LACTIC ACID, ED - Abnormal; Notable for the following components:   Lactic Acid, Venous 2.2 (*)    All  other components within normal limits  TROPONIN I (HIGH SENSITIVITY) - Abnormal; Notable for the following components:   Troponin I (High Sensitivity) 36 (*)    All other components within normal limits  RESP PANEL BY RT-PCR (RSV, FLU A&B, COVID)  RVPGX2  CULTURE, BLOOD (ROUTINE X 2)  CULTURE, BLOOD (ROUTINE X 2)  D-DIMER, QUANTITATIVE  URINALYSIS, W/ REFLEX TO CULTURE (INFECTION SUSPECTED)  I-STAT CG4 LACTIC ACID, ED  I-STAT CG4 LACTIC ACID, ED  TROPONIN I (HIGH SENSITIVITY)    EKG: EKG Interpretation Date/Time:  Tuesday May 08 2024 14:17:06 EDT Ventricular Rate:  106 PR Interval:  140 QRS Duration:  88 QT Interval:  332 QTC Calculation: 441 R Axis:   -75  Text Interpretation: Sinus tachycardia Left axis deviation Anterior infarct , age undetermined Abnormal ECG When compared with ECG of 09-Oct-2018 23:27, PREVIOUS ECG IS PRESENT Confirmed by Jerrol Agent ([id number]) on 05/08/2024 2:52:24 PM  Radiology: DG Chest 2 View Result Date: 05/08/2024 CLINICAL DATA:  Infection EXAM: CHEST - 2 VIEW COMPARISON:  03/29/2022 FINDINGS: Frontal and lateral views of the chest are obtained on 4 images. The cardiac silhouette is stable. There are severe bullous emphysematous changes noted, stable since prior study. Chronic areas of bibasilar scarring. No acute airspace disease, effusion, or pneumothorax. No acute bony abnormalities. IMPRESSION: 1. Stable emphysema and bibasilar scarring. No acute airspace disease. Electronically Signed   By: Ozell Daring M.D.   On: 05/08/2024 15:23     .Critical  Care  Performed by: Jerrol Agent, MD Authorized by: Jerrol Agent, MD   Critical care provider statement:    Critical care time (minutes):  30   Critical care was necessary to treat or prevent imminent or life-threatening deterioration of the following conditions:  Sepsis   Critical care was time spent personally by me on the following activities:  Development of treatment plan with patient or surrogate, discussions with consultants, evaluation of patient's response to treatment, examination of patient, ordering and review of laboratory studies, ordering and review of radiographic studies, ordering and performing treatments and interventions, pulse oximetry, re-evaluation of patient's condition and review of old charts    Medications Ordered in the ED  cefTRIAXone (ROCEPHIN) 1 g in sodium chloride 0.9 % 100 mL IVPB (has no administration in time range)  azithromycin  (ZITHROMAX ) 500 mg in sodium chloride 0.9 % 250 mL IVPB (has no administration in time range)  sodium chloride 0.9 % bolus 500 mL (0 mLs Intravenous Stopped 05/08/24 1550)    Clinical Course as of 05/08/24 1715  Tue May 08, 2024  1453 WBC(!): 28.7 [JL]    Clinical Course User Index [JL] Jerrol Agent, MD                                 Medical Decision Making Amount and/or Complexity of Data Reviewed Labs: ordered. Decision-making details documented in ED Course. Radiology: ordered.    78 year old male with medical history significant for COPD, CKD, chronic respiratory with hypoxia on 2 L O2 via nasal cannula at baseline, DM 2, paroxysmal atrial fibrillation on Eliquis  who presents to the emergency department with fatigue and generalized weakness that started this morning.  Reportedly had been in A-fib this morning.  He took his home blood pressure medication and then EMS was called out due to profound weakness.  EMS found the patient to be hypotensive BP 78/42.  He was administered 500 cc of fluid with improvement  in  his blood pressure to 116/58.  He complains of mild nausea, denies any chest pain.  Had some chest wall discomfort.  He felt like it was may be related to his COPD, described a dull ache in the right chest wall.  His nausea has since improved after 4 of Zofran .  He endorses fever to 102 earlier today.   On arrival, the patient was afebrile, temperature 98.3 (reports a fever to 102 at home), tachycardic heart rate 104, sinus tachycardia noted on cardiac telemetry, hypertensive borderline blood pressure 91/61, respirate 20, saturating 98% on his home 2 L O2 via nasal cannula.  Concern for potential sepsis given fever and tachycardia, septic workup initiated.  Initial labs concerning for leukocytosis to 28.7 with a neutrophilia and left shift, CMP with a serum creatinine 1.31, LFTs normal, electrolytes normal, blood glucose 120, BUN elevated at 25, patient administered 500 cc bolus in addition to the 500 cc bolus he received with EMS.  Lactic acid was elevated 2.2, D-dimer was normal, low concern for PE in this patient who is anticoagulated on Eliquis .  Cardiac troponin initially elevated at 36, nonspecific, VBG was unremarkable, COVID flu and RSV PCR testing was negative.    Chest x-ray: IMPRESSION:  1. Stable emphysema and bibasilar scarring. No acute airspace  disease.   Source could be respiratory, intra-abdominal or genitourinary, will obtain further diagnostic workup to further assess.  CT chest abdomen pelvis obtained and pending at time of signout.  Plan at time of signout to reassess the patient, follow-up results of CT imaging and urinalysis, plan for likely admission.  Will cover the patient with antibiotics pending results of diagnostic testing.  Signout given to Dr. Emil at [phone].     Final diagnoses:  None    ED Discharge Orders     None          Jerrol Agent, MD 05/08/24 1715

## 2024-05-08 NOTE — Assessment & Plan Note (Addendum)
 Pt was initially started on antibiotics for potential pneumonia, however imaging and exam unremarkable for focal lung findings. Will hold off on redosing antibiotics unless pt starts to fever or worsen.  - Admit to Med Surg FMTS w/ attending Dr. Delores - s/p Ceftriaxone and azithromycin  in ED. Redose if pt fevers or clinically worsens. - f/u Bcx, procalcitonin - Echo - TSH - AM CBC, BMP, Mg - PT/OT - Vitals per unit

## 2024-05-08 NOTE — Assessment & Plan Note (Signed)
 HTN: Cont home HCTZ, losartan   COPD/emphysema: Home O2 3-4L [place]. Cont home chronic prednisone , azithromycin . Cont home singulair , mometasone , Cont prn albuterol .  PAF: Cont home eliquis , bisoprolol  BPH: home finasteride , tamsulosin  HLD: Home Crestor 

## 2024-05-08 NOTE — Assessment & Plan Note (Signed)
 NSR now. - TSH, echo as above - Cont home bisprolol - Cont home eliquis 

## 2024-05-08 NOTE — H&P (Cosign Needed Addendum)
 Hospital Admission History and Physical Service Pager: [phone]  Patient name: Michael Robertson. Medical record number: [id number] Date of Birth: 1945/09/24 Age: 78 y.o. Gender: male  Primary Care Provider: Isaiah Lai, DO Consultants: None Code Status: Full   Preferred Emergency Contact:   Contact Information     Name Relation Home Work Mobile   [place] Son   [phone]      Other Contacts   None on File      Chief Complaint: Generalized weakness, hypotension  Differential and Medical Decision Making:  Michael Kalla. is a 78 y.o. male presenting with generalized weakness and hypotension. Differential includes PAF, viral URI, valvular disease. PAF is considered given tachycardia during episode and known hx of PAF. Will check TSH. Viral URI considered given mild cough, but pt otherwise has no clear infectious signs such as fevers. Notably, he does have leukocytosis w/ L shift. PNA is unlikely given baseline O2 requirement, no focal lung findings, and negative imaging. UA and RPP unremarkable. Bcx and procalcitonin pending. Valvular disease is considered given age, will check echo. PE also considered, although less likely given no tachycardia and baseline O2. Overall pt feels close to baseline now, but would benefit from admission for close monitoring.  Assessment & Plan Generalized weakness Pt was initially started on antibiotics for potential pneumonia, however imaging and exam unremarkable for focal lung findings. Will hold off on redosing antibiotics unless pt starts to fever or worsen.  - Admit to Med Surg FMTS w/ attending Dr. Delores - s/p Ceftriaxone and azithromycin  in ED. Redose if pt fevers or clinically worsens. - f/u Bcx, procalcitonin - Echo - TSH - AM CBC, BMP, Mg - PT/OT - Vitals per unit A-fib (HCC) NSR now. - TSH, echo as above - Cont home bisprolol - Cont home eliquis  Chronic health problem HTN: Cont home HCTZ, losartan   COPD/emphysema:  Home O2 3-4L Michael Robertson. Cont home chronic prednisone , azithromycin . Cont home singulair , mometasone , Cont prn albuterol .  PAF: Cont home eliquis , bisoprolol  BPH: home finasteride , tamsulosin  HLD: Home Crestor   FEN/GI: Regular VTE Prophylaxis: Eliquis   Disposition: MedSurg  History of Present Illness:  Michael Newman. is a 78 y.o. male presenting with generalized weakness. Reports feeling normal yesterday night, then this morning he was checking his pulse on a home pulse ox and noted his HR to be 160s. Denies chest pain or shortness of breath during this episode.  EMS was called and he was back to normal sinus rhythm by the time they arrived, but they noticed that his blood pressure was low so they brought him to the ED. In the ED, he reports he started to feel very weak and nauseous. He hasn't eaten anything yet today. Denies fevers. No recent med changes. Denies new diarrhea, polyuria. Denies wheezing or sore throat. His son with sick about 1-1/2 weeks ago, they live together.  ED Course: Initially had lactic acidosis 2.2 and tachycardia, leukocytosis. Afebrile. UA unremarkable.  D-dimer unremarkable.  Tropes mildly elevated but flattened.  CT abdomen pelvis chest unremarkable for acute processes.  Patient received 1500 cc NS bolus and started empirically on ceftriaxone and azithromycin  for suspected pneumonia.    Pertinent Past Medical History: Hypertension, COPD, CKD 3, HLD, BPH, T2DM, PAF  Remainder reviewed in history tab.   Pertinent Past Surgical History: Pertinent surgical history Remainder reviewed in history tab.   Pertinent Social History: Denies tobacco, alcohol, other drug use.  Lives with son and daughter  Pertinent Family  History: Mother deceased, stroke, dementia, diabetes Father deceased, cancer, stroke    Important Outpatient  Medications: Albuterol  Eliquis  Azithromycin  Bisoprolol  Prednisone  Finasteride  Flomax  Losartan  Hydrochlorothiazide  Protonix Singulair  Mometasone  inhaler Crestor  Trazodone    Objective: BP 129/61   Pulse 90   Temp 98.4 F (36.9 C)   Resp 16   SpO2 100%  Exam: General: Alert, pleasant man speaking comfortably in full sentences on 2L [place]. NAD. HEENT: NCAT. MMM. CV: RRR, no murmurs. Cap refill <2. Resp: CTAB, no wheezing or crackles. Normal WOB on 2L[place].  Abm: Soft, nontender, nondistended. BS present. Ext: Moves all ext spontaneously Skin: Warm, well perfused   Labs:  CBC BMET  Recent Labs  Lab 05/08/24 1420 05/08/24 1523  WBC 28.7*  --   HGB 12.8* 13.9  HCT 41.4 41.0  PLT 329  --    Recent Labs  Lab 05/08/24 1420 05/08/24 1523  NA 135 137  K 3.6 3.6  CL 101  --   CO2 24  --   BUN 25*  --   CREATININE 1.31*  --   GLUCOSE 120*  --   CALCIUM  8.5*  --       EKG: Normal sinus tach   Imaging Studies Performed:  CT chest, abm, pelvis 1. No acute intrathoracic, intra-abdominal, or intrapelvic process. 2. Pulmonary emphysema is present. Pulmonary emphysema is an independent risk factor for lung cancer. Recommend patient be considered for lung cancer screening. [place]  Chief Financial Officer. Screening for Lung Cancer: [place]  Licensed conveyancer. JAMA. 2021;325(10):962-970. 3. Aortic Atherosclerosis (ICD10-I70.0). Coronary artery atherosclerosis.  CXR 1. Stable emphysema and bibasilar scarring. No acute airspace disease.  Lera Nancyann NOVAK, DO 05/08/2024, 8:39 PM PGY-3, [facility]  FPTS Intern pager: [phone], text pages welcome Secure chat group [facility] Teaching Service

## 2024-05-09 ENCOUNTER — Encounter (HOSPITAL_COMMUNITY): Payer: Self-pay | Admitting: Family Medicine

## 2024-05-09 ENCOUNTER — Observation Stay (HOSPITAL_COMMUNITY)

## 2024-05-09 DIAGNOSIS — I4891 Unspecified atrial fibrillation: Secondary | ICD-10-CM

## 2024-05-09 LAB — BASIC METABOLIC PANEL WITH GFR
Anion gap: 9 (ref 5–15)
BUN: 21 mg/dL (ref 8–23)
CO2: 24 mmol/L (ref 22–32)
Calcium: 8.1 mg/dL — ABNORMAL LOW (ref 8.9–10.3)
Chloride: 106 mmol/L (ref 98–111)
Creatinine, Ser: 1.46 mg/dL — ABNORMAL HIGH (ref 0.61–1.24)
GFR, Estimated: 49 mL/min — ABNORMAL LOW (ref >60)
Glucose, Bld: 88 mg/dL (ref 70–99)
Potassium: 3.8 mmol/L (ref 3.5–5.1)
Sodium: 139 mmol/L (ref 135–145)

## 2024-05-09 LAB — TROPONIN I (HIGH SENSITIVITY): Troponin I (High Sensitivity): 33 ng/L — ABNORMAL HIGH (ref <18)

## 2024-05-09 LAB — CBC
HCT: 37.4 % — ABNORMAL LOW (ref 39.0–52.0)
Hemoglobin: 11.4 g/dL — ABNORMAL LOW (ref 13.0–17.0)
MCH: 30.6 pg (ref 26.0–34.0)
MCHC: 30.5 g/dL (ref 30.0–36.0)
MCV: 100.5 fL — ABNORMAL HIGH (ref 80.0–100.0)
Platelets: 229 K/uL (ref 150–400)
RBC: 3.72 MIL/uL — ABNORMAL LOW (ref 4.22–5.81)
RDW: 15.4 % (ref 11.5–15.5)
WBC: 26.2 K/uL — ABNORMAL HIGH (ref 4.0–10.5)
nRBC: 0 % (ref 0.0–0.2)

## 2024-05-09 LAB — ECHOCARDIOGRAM COMPLETE
Area-P 1/2: 4.49 cm2
Height: 71 in
S' Lateral: 2.4 cm
Weight: 2412.71 [oz_av]

## 2024-05-09 LAB — PROCALCITONIN: Procalcitonin: 0.62 ng/mL

## 2024-05-09 LAB — TSH: TSH: 0.843 u[IU]/mL (ref 0.350–4.500)

## 2024-05-09 LAB — MAGNESIUM: Magnesium: 1.8 mg/dL (ref 1.7–2.4)

## 2024-05-09 MED ORDER — BISOPROLOL FUMARATE 5 MG PO TABS
10.0000 mg | ORAL_TABLET | Freq: Every day | ORAL | Status: DC
  Administered 2024-05-09 – 2024-05-11 (×3): 10 mg via ORAL
  Filled 2024-05-09: qty 2
  Filled 2024-05-09 (×2): qty 1

## 2024-05-09 MED ORDER — INFLUENZA VAC SPLIT HIGH-DOSE 0.5 ML IM SUSY
0.5000 mL | PREFILLED_SYRINGE | INTRAMUSCULAR | Status: AC
Start: 2024-05-10 — End: 2024-05-12
  Administered 2024-05-12: 0.5 mL via INTRAMUSCULAR
  Filled 2024-05-09: qty 0.5

## 2024-05-09 MED ORDER — SODIUM CHLORIDE 0.9 % IV SOLN
2.0000 g | INTRAVENOUS | Status: DC
  Administered 2024-05-09: 2 g via INTRAVENOUS
  Filled 2024-05-09: qty 20

## 2024-05-09 MED ORDER — ENSURE PLUS HIGH PROTEIN PO LIQD
237.0000 mL | Freq: Two times a day (BID) | ORAL | Status: DC
Start: 2024-05-10 — End: 2024-05-12
  Administered 2024-05-10 – 2024-05-12 (×5): 237 mL via ORAL

## 2024-05-09 NOTE — Progress Notes (Signed)
   05/09/24 1329  TOC Brief Assessment  Insurance and Status Reviewed  Patient has primary care physician Yes  Home environment has been reviewed From home c/adult children  Prior level of function: Independent, drives himself  Prior/Current Home Services Current home services (oxygen  ([employer]))  Social Drivers of Health Review SDOH reviewed no interventions necessary  Readmission risk has been reviewed Yes  Transition of care needs no transition of care needs at this time   Current DME: oxygen , nebulizer  Transition of Care Department (TOC) has reviewed patient and no other TOC needs have been identified at this time. We will continue to monitor patient advancement. If new patient needs arise, please place a TOC consult.

## 2024-05-09 NOTE — Assessment & Plan Note (Addendum)
 Regular rate and rhythm when I assessed the patient at bedside. He did have brief SVT this morning - Will follow echo results - consider cardiology consult if returns to SVT

## 2024-05-09 NOTE — Plan of Care (Signed)
  Problem: Activity: Goal: Ability to tolerate increased activity will improve Outcome: Progressing   Problem: Clinical Measurements: Goal: Ability to maintain a body temperature in the normal range will improve Outcome: Progressing   Problem: Respiratory: Goal: Ability to maintain adequate ventilation will improve Outcome: Progressing Goal: Ability to maintain a clear airway will improve Outcome: Progressing   

## 2024-05-09 NOTE — Assessment & Plan Note (Addendum)
 HTN: Cont home HCTZ, losartan   COPD/emphysema: Home O2 3-4L [place]. Cont home chronic prednisone , azithromycin . Cont home singulair , mometasone , Cont prn albuterol .  PAF: Cont home eliquis , bisoprolol  BPH: home finasteride , tamsulosin  HLD: Home Crestor 

## 2024-05-09 NOTE — Evaluation (Signed)
 Occupational Therapy Evaluation Patient Details Name: Michael Robertson. MRN: [id number] DOB: 1945/08/29 Today's Date: 05/09/2024   History of Present Illness   Michael Robertson. is a 78 y.o. male who presented to ED with generalized weakness and hypotension. PHMx: Afib, HTN, DM2, COPD, 2.5-3 L of O2 at home     Clinical Impressions This 78 yo male admitted with above presents to acute OT with PLOF of being independent on 2.5-3 liters of O2 at home and no AD for ambulation. He currently is CGA for ambulation with 3 LOB (self corrected) and holding onto wall railing intermittently without use of AD for ambulation and CGA for any ADLs when up on his feet. He will continue to benefit from acute OT without need for follow up.     If plan is discharge home, recommend the following:   A little help with walking and/or transfers;A little help with bathing/dressing/bathroom;Help with stairs or ramp for entrance     Functional Status Assessment   Patient has had a recent decline in their functional status and demonstrates the ability to make significant improvements in function in a reasonable and predictable amount of time.     Equipment Recommendations   None recommended by OT      Precautions/Restrictions   Precautions Precautions: Fall Restrictions Weight Bearing Restrictions Per Provider Order: No     Mobility Bed Mobility Overal bed mobility: Independent             General bed mobility comments: off of stretcher    Transfers Overall transfer level: Needs assistance   Transfers: Sit to/from Stand Sit to Stand: Contact guard assist           General transfer comment: CGA for ambulation without AD and occassionally holding onto guard rail on unit; 3 mild LOB that he self corrected          ADL either performed or assessed with clinical judgement   ADL Overall ADL's : Needs assistance/impaired Eating/Feeding: Independent;Sitting   Grooming:  Contact guard assist;Standing   Upper Body Bathing: Set up;Sitting   Lower Body Bathing: Contact guard assist;Sit to/from stand   Upper Body Dressing : Set up;Sitting   Lower Body Dressing: Contact guard assist;Sit to/from stand   Toilet Transfer: Contact guard assist;Ambulation;Comfort height toilet   Toileting- Clothing Manipulation and Hygiene: Contact guard assist;Sit to/from stand               Vision Patient Visual Report: No change from baseline              Pertinent Vitals/Pain Pain Assessment Pain Assessment: No/denies pain     Extremity/Trunk Assessment Upper Extremity Assessment Upper Extremity Assessment: Overall WFL for tasks assessed           Communication Communication Communication: No apparent difficulties   Cognition Arousal: Alert Behavior During Therapy: WFL for tasks assessed/performed Cognition: No apparent impairments                               Following commands: Intact                  Home Living Family/patient expects to be discharged to:: Private residence Living Arrangements: Children Available Help at Discharge: Family;Available PRN/intermittently (grown children work) Type of Home: House Home Access: Level entry     Home Layout: Two level;Able to live on main level with bedroom/bathroom     Bathroom Shower/Tub:  Walk-in shower   Bathroom Toilet: Handicapped height     Home Equipment: Shower seat   Additional Comments: Doesn't use seat to shower, uses it to sit down on to dry off      Prior Functioning/Environment Prior Level of Function : Independent/Modified Independent;Driving                    OT Problem List: Decreased activity tolerance;Impaired balance (sitting and/or standing)   OT Treatment/Interventions: Self-care/ADL training;Energy conservation;DME and/or AE instruction;Patient/family education      OT Goals(Current goals can be found in the care plan section)    Acute Rehab OT Goals Patient Stated Goal: to feel better OT Goal Formulation: With patient Time For Goal Achievement: 05/22/24 Potential to Achieve Goals: Good   OT Frequency:  Min 2X/week       AM-PAC OT 6 Clicks Daily Activity     Outcome Measure Help from another person eating meals?: None Help from another person taking care of personal grooming?: A Little Help from another person toileting, which includes using toliet, bedpan, or urinal?: A Little Help from another person bathing (including washing, rinsing, drying)?: A Little Help from another person to put on and taking off regular upper body clothing?: A Little Help from another person to put on and taking off regular lower body clothing?: A Little 6 Click Score: 19   End of Session Equipment Utilized During Treatment: Oxygen  (3 liters) Nurse Communication: Mobility status (sats dropped to 84% on 3 liters (tested right after got back from ambulating around the unit))  Activity Tolerance:  (O2 dropped on 3 liters with ambulation no AD) Patient left: in chair;with call bell/phone within reach  OT Visit Diagnosis: Unsteadiness on feet (R26.81);Other abnormalities of gait and mobility (R26.89)                Time: 9143-9075 OT Time Calculation (min): 28 min Charges:  OT General Charges $OT Visit: 1 Visit OT Evaluation $OT Eval Moderate Complexity: 1 Mod OT Treatments $Self Care/Home Management : 8-22 mins  Donny BECKER OT Acute Rehabilitation Services Office [phone]    [name] 05/09/2024, 9:38 AM

## 2024-05-09 NOTE — Assessment & Plan Note (Addendum)
 This picture is a bit unclear as Michael Robertson history is conflicting with some reports of fever and coughing, but Michael Robertson Michael Robertson also reporting he feels baseline this morning.  CT revealed known emphysema but no acute airspace disease.  He does have a leukocytosis which is only mildly improved today, which may be due to recent steroid burst versus infectious process, like new pneumonia.  Initial elevated lactic acidosis now resolved. Procalcitonin 0.62, which may indicate brewing pneumonia.  No infectious urinary or intra-abdominal source identified. - Will continue IV ceftriaxone today for possible CAP - Continue home azithromycin  MWF, he did get an additional dose yesterday -Awaiting PT/OT recommendations on safe discharge planning Michael Robertson initial concern for weakness - AM CBC, BMP

## 2024-05-09 NOTE — Care Management Obs Status (Signed)
 MEDICARE OBSERVATION STATUS NOTIFICATION   Patient Details  Name: Michael Robertson. MRN: [id number] Date of Birth: [birth year]   Medicare Observation Status Notification Given:  Yes    Nena LITTIE Coffee, RN 05/09/2024, 1:27 PM

## 2024-05-09 NOTE — Progress Notes (Signed)
 Daily Progress Note Intern Pager: [phone]  Patient name: Michael Robertson. Medical record number: [id number] Date of birth: Apr 11, 1946 Age: 78 y.o. Gender: male  Primary Care Provider: Madelon Donald HERO, DO Consultants: None Code Status: Full  Pt Overview and Major Events to Date:  10/21-admitted  Assessment and Plan:  This is a 78 year old male with PMH A-fib, COPD, GERD, CKD, BPH, T2DM, HLD who presented with generalized weakness and hypotension who met SIRS criteria possibly secondary to a respiratory source given patient's report of fever and recent onset of cough, with leukocytosis and elevated procalcitonin, though no evidence of pneumonia on imaging, who seems to have improved after antibiotics yesterday. Assessment & Plan Generalized weakness This picture is a bit unclear as patient history is conflicting with some reports of fever and coughing, but patient patient also reporting he feels baseline this morning.  CT revealed known emphysema but no acute airspace disease.  He does have a leukocytosis which is only mildly improved today, which may be due to recent steroid burst versus infectious process, like new pneumonia.  Initial elevated lactic acidosis now resolved. Procalcitonin 0.62, which may indicate brewing pneumonia.  No infectious urinary or intra-abdominal source identified. - Will continue IV ceftriaxone today for possible CAP - Continue home azithromycin  MWF, he did get an additional dose yesterday -Awaiting PT/OT recommendations on safe discharge planning patient initial concern for weakness - AM CBC, BMP A-fib (HCC) Regular rate and rhythm when I assessed the patient at bedside. He did have brief SVT this morning - Will follow echo results - consider cardiology consult if returns to SVT Chronic health problem HTN: Cont home HCTZ, losartan   COPD/emphysema: Home O2 3-4L [place]. Cont home chronic prednisone , azithromycin . Cont home singulair , mometasone , Cont prn  albuterol .  PAF: Cont home eliquis , bisoprolol  BPH: home finasteride , tamsulosin  HLD: Home Crestor   FEN/GI: regular PPx: eliquis  Dispo:Pending PT recommendations  pending clinical improvement .   Subjective:  This morning, patient reports that he feels back to baseline and is interested in going home.  He does not have any increased SOB compared to normal.  No chest pain, abdominal pain now.  He tells me that he has not had any fevers, recent illnesses, cough.  No dysuria or increased urinary frequency.  However, it sounds like he previously reported a fever of 102 prior to admission and Dr. Theophilus saw him after me this morning at which time he reported a new cough  Objective: Temp:  [98.3 F (36.8 C)-99.6 F (37.6 C)] 99.6 F (37.6 C) (10/22 0400) Pulse Rate:  [90-104] 94 (10/22 0700) Resp:  [16-20] 18 (10/22 0700) BP: (91-144)/(49-89) 127/60 (10/22 0700) SpO2:  [90 %-100 %] 98 % (10/22 0700) Weight:  [68.4 kg] 68.4 kg (10/21 2343) Physical Exam: General: Well-appearing male lying in bed getting an echo when I saw him at bedside CV: Regular rate and rhythm, no murmurs appreciated Respiratory: No cough, no increased work of breathing, lungs clear to auscultation bilaterally, patient satting above goal of 88 to 92% on 1-1/2 L Abdomen: Soft nontender nondistended  Laboratory: Most recent CBC Lab Results  Component Value Date   WBC 26.2 (H) 05/09/2024   HGB 11.4 (L) 05/09/2024   HCT 37.4 (L) 05/09/2024   MCV 100.5 (H) 05/09/2024   PLT 229 05/09/2024   Most recent BMP    Latest Ref Rng & Units 05/09/2024    4:02 AM  BMP  Glucose 70 - 99 mg/dL 88   BUN 8 -  23 mg/dL 21   Creatinine 9.38 - 1.24 mg/dL 8.53   Sodium 864 - 854 mmol/L 139   Potassium 3.5 - 5.1 mmol/L 3.8   Chloride 98 - 111 mmol/L 106   CO2 22 - 32 mmol/L 24   Calcium  8.9 - 10.3 mg/dL 8.1    Mg 1.8  Imaging/Diagnostic Tests: CT CHEST ABDOMEN PELVIS W CONTRAST Result Date: 05/08/2024 CLINICAL DATA:   Sepsis EXAM: CT CHEST, ABDOMEN, AND PELVIS WITH CONTRAST TECHNIQUE: Multidetector CT imaging of the chest, abdomen and pelvis was performed following the standard protocol during bolus administration of intravenous contrast. RADIATION DOSE REDUCTION: This exam was performed according to the departmental dose-optimization program which includes automated exposure control, adjustment of the mA and/or kV according to patient size and/or use of iterative reconstruction technique. CONTRAST:  75mL OMNIPAQUE  IOHEXOL  350 MG/ML SOLN COMPARISON:  05/08/2024, 06/16/2021 FINDINGS: CT CHEST FINDINGS Cardiovascular: The heart is unremarkable without pericardial effusion. No evidence of thoracic aortic aneurysm or dissection. Atherosclerosis of the aorta and coronary vasculature. Mediastinum/Nodes: No enlarged mediastinal, hilar, or axillary lymph nodes. Thyroid  gland, trachea, and esophagus demonstrate no significant findings. Lungs/Pleura: Severe bullous emphysematous changes and bilateral parenchymal lung scarring. No acute airspace disease, effusion, or pneumothorax. Central airways are patent. Musculoskeletal: No acute or destructive bony abnormalities. Reconstructed images demonstrate no additional findings. CT ABDOMEN PELVIS FINDINGS Hepatobiliary: No focal liver abnormality is seen. No gallstones, gallbladder wall thickening, or biliary dilatation. Pancreas: Unremarkable. No pancreatic ductal dilatation or surrounding inflammatory changes. Spleen: Normal in size without focal abnormality. Adrenals/Urinary Tract: Bilateral renal cortical cysts do not require imaging follow-up. Kidneys enhance normally. No urinary tract calculi or obstructive uropathy. The adrenals and bladder are unremarkable. Stomach/Bowel: No bowel obstruction or ileus. Normal appendix right lower quadrant. No bowel wall thickening or inflammatory change. Vascular/Lymphatic: Aortic atherosclerosis. 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 No enlarged abdominal or pelvic lymph nodes. Reproductive: Prostate is unremarkable. Other: No free fluid or free intraperitoneal gas. Small fat containing umbilical hernia. Musculoskeletal: No acute or destructive bony abnormalities. Reconstructed images demonstrate no additional findings. IMPRESSION: 1. No acute intrathoracic, intra-abdominal, or intrapelvic process. 2. Pulmonary emphysema is present. Pulmonary emphysema is an independent risk factor for lung cancer. Recommend patient be considered for lung cancer screening. [place]  Chief Financial Officer. Screening for Lung Cancer: [place]  Licensed conveyancer. JAMA. 2021;325(10):962-970. 3. Aortic Atherosclerosis (ICD10-I70.0). Coronary artery atherosclerosis. Electronically Signed   By: Ozell Daring M.D.   On: 05/08/2024 18:06   DG Chest 2 View Result Date: 05/08/2024 CLINICAL DATA:  Infection EXAM: CHEST - 2 VIEW COMPARISON:  03/29/2022 FINDINGS: Frontal and lateral views of the chest are obtained on 4 images. The cardiac silhouette is stable. There are severe bullous emphysematous changes noted, stable since prior study. Chronic areas of bibasilar scarring. No acute airspace disease, effusion, or pneumothorax. No acute bony abnormalities. IMPRESSION: 1. Stable emphysema and bibasilar scarring. No acute airspace disease. Electronically Signed   By: Ozell Daring M.D.   On: 05/08/2024 15:23    Alena Morrison, Elio, MD 05/09/2024, 7:29 AM  PGY-1, [facility] FPTS Intern pager: [phone], text pages welcome Secure chat group [facility] Teaching Service

## 2024-05-09 NOTE — Evaluation (Signed)
 Physical Therapy Evaluation Patient Details Name: Michael Robertson. MRN: [id number] DOB: 06-04-1946 Today's Date: 05/09/2024  History of Present Illness  Michael Robertson. is a 78 y.o. male who presented to ED with generalized weakness and hypotension. PHMx: Afib, HTN, DM2, COPD, 2.5-3 L of O2 at home  Clinical Impression  Pt presents to PT with slightly unsteady gait due to illness and inactivity. Expect pt will make good progress back to baseline with mobility. Will follow acutely but doubt pt will need PT after DC. Recommend cane for home.          If plan is discharge home, recommend the following: Assistance with cooking/housework   Can travel by private Interior and spatial designer  Recommendations for Other Services       Functional Status Assessment Patient has had a recent decline in their functional status and demonstrates the ability to make significant improvements in function in a reasonable and predictable amount of time.     Precautions / Restrictions Precautions Precautions: Fall Recall of Precautions/Restrictions: Intact Restrictions Weight Bearing Restrictions Per Provider Order: No      Mobility  Bed Mobility               General bed mobility comments: Sitting in chair    Transfers Overall transfer level: Modified independent   Transfers: Sit to/from Stand Sit to Stand: Modified independent (Device/Increase time)                Ambulation/Gait Ambulation/Gait assistance: Supervision Gait Distance (Feet): 300 Feet Assistive device: Rollator (4 wheels), Straight cane Gait Pattern/deviations: Step-through pattern, Decreased stride length Gait velocity: decr Gait velocity interpretation: 1.31 - 2.62 ft/sec, indicative of limited community ambulator   General Gait Details: Slightly unsteady gait without overt loss of balance. Improved with cane.  Stairs            Wheelchair Mobility     Tilt Bed     Modified Rankin (Stroke Patients Only)       Balance Overall balance assessment: Needs assistance Sitting-balance support: No upper extremity supported, Feet supported Sitting balance-Leahy Scale: Normal     Standing balance support: No upper extremity supported, During functional activity Standing balance-Leahy Scale: Fair                               Pertinent Vitals/Pain Pain Assessment Pain Assessment: No/denies pain    Home Living Family/patient expects to be discharged to:: Private residence Living Arrangements: Children Available Help at Discharge: Family;Available PRN/intermittently Type of Home: House Home Access: Level entry       Home Layout: Two level;Able to live on main level with bedroom/bathroom Home Equipment: Shower seat Additional Comments: Doesn't use seat to shower, uses it to sit down on to dry off    Prior Function Prior Level of Function : Independent/Modified Independent;Driving                     Extremity/Trunk Assessment   Upper Extremity Assessment Upper Extremity Assessment: Defer to OT evaluation    Lower Extremity Assessment Lower Extremity Assessment: Generalized weakness       Communication   Communication Communication: No apparent difficulties    Cognition Arousal: Alert Behavior During Therapy: WFL for tasks assessed/performed   PT - Cognitive impairments: No apparent impairments  Following commands: Intact       Cueing       General Comments      Exercises     Assessment/Plan    PT Assessment Patient needs continued PT services  PT Problem List Decreased strength;Decreased mobility;Decreased balance       PT Treatment Interventions DME instruction;Functional mobility training;Balance training;Patient/family education;Gait training;Therapeutic activities    PT Goals (Current goals can be found in the Care Plan section)  Acute Rehab PT  Goals Patient Stated Goal: return home PT Goal Formulation: With patient Time For Goal Achievement: 05/16/24 Potential to Achieve Goals: Good    Frequency Min 2X/week     Co-evaluation               AM-PAC PT 6 Clicks Mobility  Outcome Measure Help needed turning from your back to your side while in a flat bed without using bedrails?: None Help needed moving from lying on your back to sitting on the side of a flat bed without using bedrails?: None Help needed moving to and from a bed to a chair (including a wheelchair)?: A Little Help needed standing up from a chair using your arms (e.g., wheelchair or bedside chair)?: None Help needed to walk in hospital room?: A Little Help needed climbing 3-5 steps with a railing? : A Little 6 Click Score: 21    End of Session   Activity Tolerance: Patient tolerated treatment well Patient left: in chair;with call bell/phone within reach Nurse Communication: Mobility status PT Visit Diagnosis: Unsteadiness on feet (R26.81)    Time: 8798-8782 PT Time Calculation (min) (ACUTE ONLY): 16 min   Charges:   PT Evaluation $PT Eval Low Complexity: 1 Low   PT General Charges $$ ACUTE PT VISIT: 1 Visit         [facility] PT Acute Rehabilitation Services Office [phone]   Rodgers ORN [facility] 05/09/2024, 3:53 PM

## 2024-05-09 NOTE — ED Notes (Signed)
 Assuming pt care, pt bib ems coming from home for hypotension, and fatigue x 1 day. Pt reports med hx. Afib, htn, DM, chl, copd. Uses 2 LT [place] at home. Pt aaox4, lethargic needs assistance to ambulate, normally walks without assistance/devices, skin warm/dry. Pt denies pain/discomfort, call bell within reach.

## 2024-05-09 NOTE — ED Notes (Signed)
 Hospitalist team notified of EKGs in chart.

## 2024-05-10 ENCOUNTER — Inpatient Hospital Stay (HOSPITAL_COMMUNITY)

## 2024-05-10 DIAGNOSIS — K219 Gastro-esophageal reflux disease without esophagitis: Secondary | ICD-10-CM | POA: Diagnosis present

## 2024-05-10 DIAGNOSIS — Z792 Long term (current) use of antibiotics: Secondary | ICD-10-CM | POA: Diagnosis not present

## 2024-05-10 DIAGNOSIS — Z7952 Long term (current) use of systemic steroids: Secondary | ICD-10-CM | POA: Diagnosis not present

## 2024-05-10 DIAGNOSIS — Z1152 Encounter for screening for COVID-19: Secondary | ICD-10-CM | POA: Diagnosis not present

## 2024-05-10 DIAGNOSIS — Z9981 Dependence on supplemental oxygen: Secondary | ICD-10-CM | POA: Diagnosis not present

## 2024-05-10 DIAGNOSIS — R531 Weakness: Secondary | ICD-10-CM

## 2024-05-10 DIAGNOSIS — Z23 Encounter for immunization: Secondary | ICD-10-CM | POA: Diagnosis present

## 2024-05-10 DIAGNOSIS — I480 Paroxysmal atrial fibrillation: Secondary | ICD-10-CM | POA: Diagnosis present

## 2024-05-10 DIAGNOSIS — D649 Anemia, unspecified: Secondary | ICD-10-CM | POA: Diagnosis present

## 2024-05-10 DIAGNOSIS — J439 Emphysema, unspecified: Secondary | ICD-10-CM | POA: Diagnosis present

## 2024-05-10 DIAGNOSIS — Z79899 Other long term (current) drug therapy: Secondary | ICD-10-CM | POA: Diagnosis not present

## 2024-05-10 DIAGNOSIS — Z881 Allergy status to other antibiotic agents status: Secondary | ICD-10-CM | POA: Diagnosis not present

## 2024-05-10 DIAGNOSIS — I959 Hypotension, unspecified: Secondary | ICD-10-CM | POA: Diagnosis present

## 2024-05-10 DIAGNOSIS — R110 Nausea: Secondary | ICD-10-CM | POA: Diagnosis present

## 2024-05-10 DIAGNOSIS — I471 Supraventricular tachycardia, unspecified: Secondary | ICD-10-CM | POA: Diagnosis present

## 2024-05-10 DIAGNOSIS — R509 Fever, unspecified: Secondary | ICD-10-CM | POA: Diagnosis present

## 2024-05-10 DIAGNOSIS — N400 Enlarged prostate without lower urinary tract symptoms: Secondary | ICD-10-CM | POA: Diagnosis present

## 2024-05-10 DIAGNOSIS — E785 Hyperlipidemia, unspecified: Secondary | ICD-10-CM | POA: Diagnosis present

## 2024-05-10 DIAGNOSIS — N183 Chronic kidney disease, stage 3 unspecified: Secondary | ICD-10-CM | POA: Diagnosis present

## 2024-05-10 DIAGNOSIS — E872 Acidosis, unspecified: Secondary | ICD-10-CM | POA: Diagnosis present

## 2024-05-10 DIAGNOSIS — I129 Hypertensive chronic kidney disease with stage 1 through stage 4 chronic kidney disease, or unspecified chronic kidney disease: Secondary | ICD-10-CM | POA: Diagnosis present

## 2024-05-10 DIAGNOSIS — E860 Dehydration: Secondary | ICD-10-CM | POA: Diagnosis present

## 2024-05-10 DIAGNOSIS — E1122 Type 2 diabetes mellitus with diabetic chronic kidney disease: Secondary | ICD-10-CM | POA: Diagnosis present

## 2024-05-10 DIAGNOSIS — Z7901 Long term (current) use of anticoagulants: Secondary | ICD-10-CM | POA: Diagnosis not present

## 2024-05-10 LAB — RESPIRATORY PANEL BY PCR

## 2024-05-10 LAB — BASIC METABOLIC PANEL WITH GFR
Anion gap: 9 (ref 5–15)
BUN: 21 mg/dL (ref 8–23)
CO2: 26 mmol/L (ref 22–32)
Calcium: 8.7 mg/dL — ABNORMAL LOW (ref 8.9–10.3)
Chloride: 105 mmol/L (ref 98–111)
Creatinine, Ser: 1.3 mg/dL — ABNORMAL HIGH (ref 0.61–1.24)
GFR, Estimated: 56 mL/min — ABNORMAL LOW (ref >60)
Glucose, Bld: 109 mg/dL — ABNORMAL HIGH (ref 70–99)
Potassium: 3.9 mmol/L (ref 3.5–5.1)
Sodium: 140 mmol/L (ref 135–145)

## 2024-05-10 LAB — CBC
HCT: 37.3 % — ABNORMAL LOW (ref 39.0–52.0)
Hemoglobin: 11.5 g/dL — ABNORMAL LOW (ref 13.0–17.0)
MCH: 30.5 pg (ref 26.0–34.0)
MCHC: 30.8 g/dL (ref 30.0–36.0)
MCV: 98.9 fL (ref 80.0–100.0)
Platelets: 207 K/uL (ref 150–400)
RBC: 3.77 MIL/uL — ABNORMAL LOW (ref 4.22–5.81)
RDW: 14.8 % (ref 11.5–15.5)
WBC: 18.5 K/uL — ABNORMAL HIGH (ref 4.0–10.5)
nRBC: 0 % (ref 0.0–0.2)

## 2024-05-10 LAB — MRSA NEXT GEN BY PCR, NASAL: MRSA by PCR Next Gen: NOT DETECTED

## 2024-05-10 MED ORDER — DILTIAZEM LOAD VIA INFUSION
15.0000 mg | Freq: Once | INTRAVENOUS | Status: AC
  Administered 2024-05-10: 15 mg via INTRAVENOUS
  Filled 2024-05-10: qty 15

## 2024-05-10 MED ORDER — PIPERACILLIN-TAZOBACTAM 3.375 G IVPB
3.3750 g | Freq: Three times a day (TID) | INTRAVENOUS | Status: DC
  Administered 2024-05-10 – 2024-05-11 (×3): 3.375 g via INTRAVENOUS
  Filled 2024-05-10 (×4): qty 50

## 2024-05-10 MED ORDER — VANCOMYCIN HCL 1500 MG/300ML IV SOLN
1500.0000 mg | Freq: Once | INTRAVENOUS | Status: DC
  Filled 2024-05-10: qty 300

## 2024-05-10 MED ORDER — VANCOMYCIN HCL IN DEXTROSE 1-5 GM/200ML-% IV SOLN: 1000.0000 mg | INTRAVENOUS | Status: DC

## 2024-05-10 MED ORDER — LACTATED RINGERS IV BOLUS
500.0000 mL | Freq: Once | INTRAVENOUS | Status: AC
  Administered 2024-05-10: 500 mL via INTRAVENOUS

## 2024-05-10 MED ORDER — DILTIAZEM HCL-DEXTROSE 125-5 MG/125ML-% IV SOLN (PREMIX)
5.0000 mg/h | INTRAVENOUS | Status: DC
  Administered 2024-05-10: 5 mg/h via INTRAVENOUS
  Filled 2024-05-10: qty 125

## 2024-05-10 MED ORDER — DILTIAZEM HCL 60 MG PO TABS
60.0000 mg | ORAL_TABLET | Freq: Three times a day (TID) | ORAL | Status: DC
  Administered 2024-05-10 – 2024-05-11 (×3): 60 mg via ORAL
  Filled 2024-05-10 (×3): qty 1

## 2024-05-10 MED ORDER — ALBUTEROL SULFATE (2.5 MG/3ML) 0.083% IN NEBU
2.5000 mg | INHALATION_SOLUTION | Freq: Four times a day (QID) | RESPIRATORY_TRACT | Status: DC
  Administered 2024-05-10 – 2024-05-11 (×4): 2.5 mg via RESPIRATORY_TRACT
  Filled 2024-05-10 (×4): qty 3

## 2024-05-10 NOTE — Significant Event (Addendum)
 Rapid Response Event Note   Reason for Call :  HR sustaining 150s Per MD: Cardiazem 15mg  IV load then repeat EKG   Initial Focused Assessment:  Pleasant male in bed, A&Ox4 in NAD currently. Patient denies dizziness, flutters, CP, SOB. Skin warm and dry. Lungs diminished L>R, minor wheezing with exhalation. Cough present, states productive at times (chronic per pt). Patient  111/72 (84) HR 120-150 RR 20 O2 98% 2L   Interventions/Plan of Care:   500ml LR bolus started Cardiazem 15mg  load: HR initially decreased 90-120, though briefly Repeat EKG post load PCU tx orders already in, transferred 6E13 Cardiazem gtt started  108/69 (82) HR 150 RR 23 O2 98% 2L  ADDENDUM 1045: pt arrived to 6E, increased cardiazem gtt 7.5mg /hr, almost immediately pt converted NSR 90s. EKG obtained.  116/65 (79) HR 93 RR 20 O2 95% 2L  Event Summary:  MD Notified: Pruett MD Call Time: 9154 notified, 9081 Med arrival Arrival Time: 0920 End Time: 1045  Tonna Chiquita POUR, RN

## 2024-05-10 NOTE — Progress Notes (Signed)
 Pharmacy Antibiotic Note  Michael Robertson. is a 78 y.o. male with history of CKD presented with tachycardia and leukocytosis on 05/08/2024 meeting SIRS criteria secondary to a respiratory source. Patient initially started on Ceftriaxone and Azithromycin . On 05/10/2024, patient developed a fever of 102.2 after day 2 of antibiotics. WBC and Scr elevated but trending down. Pharmacy has been consulted for the initiation of Vancomycin and Zosyn. Will discontinue Ceftriaxone and Azithromycin  at this time.   Plan: Vancomycin 1500mg  IV x1, then 1000mg  IV every 24 hours.  Dose calculated using TBW, Scr: 1.3, and Vd of 0.72. Estimated AUC: 496 mcg*h/mL. Goal trough 15-20 mcg/mL. Zosyn 3.375g IV q8h (4 hour infusion). Monitor Temperature, WBC, Plts, Scr, and clinical progression. Collect Vancomycin levels as clinically necessary.  Height: 5' 11 (180.3 cm) Weight: 68 kg (150 lb) IBW/kg (Calculated) : 75.3  Temp (24hrs), Avg:98.7 F (37.1 C), Min:97 F (36.1 C), Max:102.2 F (39 C)  Recent Labs  Lab 05/08/24 1420 05/08/24 1429 05/08/24 1634 05/08/24 1837 05/08/24 2125 05/09/24 0402 05/10/24 0316  WBC 28.7*  --   --   --   --  26.2* 18.5*  CREATININE 1.31*  --   --   --   --  1.46* 1.30*  LATICACIDVEN  --  1.6 2.2* 2.3* 1.3  --   --     Estimated Creatinine Clearance: 44.3 mL/min (A) (by C-G formula based on SCr of 1.3 mg/dL (H)).    Allergies  Allergen Reactions   Doxycycline     REACTION: vomiting    Antimicrobials this admission: Vancomycin 10/23 >>  Zosyn 10/23 >>  Ceftriaxone 10/21>>10/23 Azithromycin  10/21>>10/23  Microbiology results: 10/21 BCx: NGTD 10/23 RVP: Negative  10/23 MRSA PCR: Negative  Thank you for allowing pharmacy to be a part of this patient's care.  Deleta Colt PharmD Candidate 2026 05/10/2024 1:18 PM

## 2024-05-10 NOTE — Assessment & Plan Note (Addendum)
 Patient well-appearing despite being in RVR this morning when I saw him at bedside.  Cardiology consulted, reviewed EKG, and felt that it was A-fib RVR.  Reported they would not see him as his A-fib is chronic. -Started diltiazem drip with 15 mg IV loading dose - will continue bisoporolol 10 mg at this time, pending rate control with diltiazem -AM BMP -Status post 500 cc fluid bolus

## 2024-05-10 NOTE — Plan of Care (Signed)
  Problem: Activity: Goal: Ability to tolerate increased activity will improve Outcome: Progressing   Problem: Clinical Measurements: Goal: Ability to maintain a body temperature in the normal range will improve Outcome: Progressing   Problem: Respiratory: Goal: Ability to maintain adequate ventilation will improve Outcome: Progressing Goal: Ability to maintain a clear airway will improve Outcome: Progressing   Problem: Education: Goal: Knowledge of General Education information will improve Description: Including pain rating scale, medication(s)/side effects and non-pharmacologic comfort measures Outcome: Progressing   Problem: Health Behavior/Discharge Planning: Goal: Ability to manage health-related needs will improve Outcome: Progressing   Problem: Clinical Measurements: Goal: Ability to maintain clinical measurements within normal limits will improve Outcome: Progressing Goal: Will remain free from infection Outcome: Progressing Goal: Diagnostic test results will improve Outcome: Progressing Goal: Respiratory complications will improve Outcome: Progressing Goal: Cardiovascular complication will be avoided Outcome: Progressing   Problem: Activity: Goal: Risk for activity intolerance will decrease Outcome: Progressing   Problem: Nutrition: Goal: Adequate nutrition will be maintained Outcome: Progressing   Problem: Coping: Goal: Level of anxiety will decrease Outcome: Progressing   Problem: Elimination: Goal: Will not experience complications related to bowel motility Outcome: Progressing Goal: Will not experience complications related to urinary retention Outcome: Progressing   Problem: Pain Managment: Goal: General experience of comfort will improve and/or be controlled Outcome: Progressing   Problem: Pain Managment: Goal: General experience of comfort will improve and/or be controlled Outcome: Progressing   Problem: Safety: Goal: Ability to remain  free from injury will improve Outcome: Progressing   Problem: Skin Integrity: Goal: Risk for impaired skin integrity will decrease Outcome: Progressing

## 2024-05-10 NOTE — Assessment & Plan Note (Addendum)
 Fevered overnight, despite CAP coverage for 48 hours, perhaps crackles in the left mid to lower lobe in addition to wheezes throughout all lung fields. On chronic azithromycin  outpatient. - Obtain MRSA swab - Add IV vancomycin for MRSA Coverage -Transition to IV Zosyn 3.375 g every 8 hours, for pseudomonal coverage given chronic antibiotic use outpatient - Chest x-ray today - AM CBC - Home O2 3-4L  - Cont home chronic prednisone  - home MWF azithromycin  - Cont home singulair , mometasone  - Schedule albuterol  nebs q6h

## 2024-05-10 NOTE — Hospital Course (Addendum)
 This is a 78 year old male with PMH A-fib, COPD, GERD, CKD, BPH, T2DM, HLD who was admitted to the [facility] family medicine teaching service for A-fib with RVR and suspected pneumonia.  His hospital course is detailed below:  A-fib with RVR Patient reported tachycardia and fever the morning of admission; however, patient was rate controlled on arrival.  Hypotensive on arrival which improved with fluid resuscitation.  Outpatient bisoprolol  5 mg increased to 10 mg at time of admission.  Sustained RVR subsequently, and patient started on diltiazem drip with good rate control. Transitioned off drip to oral dilt. Bisoprolol  increased and oral diltiazem discontinued with good rate control.   Fever, leukocytosis Suspected pneumonia Patient reporting fever prior to admission and onset of cough during admission.  Sepsis protocol initiated given fever report, tachycardia, leukocytosis with left shift and elevated lactic acidosis.  Chest x-ray, CT chest/abdomen/pelvis without evidence of infectious source.  UA unremarkable.  Patient received IV fluid and IV Rocephin and azithromycin , which was continued for 48 hours when patient refevered.  Antibiotic coverage expanded to vancomycin and Zosyn, given chronic antibiotic use outpatient.  MRSA swab negative and vancomycin discontinued.  Repeat chest x-ray obtained and did not reveal acute changes. No new fevers, antibiotics deescalated to levaquin  to complete a 5 day course ending on 10/26.  COPD Home daily prednisone  was continued during this admission. Home Monday Wednesday Friday azithromycin  was held when Levaquin  was started for prevention of Qtc prolonging affect.   PCP recommendations: Resume home azithromycin  Consider deescalation of beta-blocker once acute infection is resolved if  HR has normalized

## 2024-05-10 NOTE — Assessment & Plan Note (Signed)
 HTN: Cont home HCTZ, losartan   PAF: Cont home eliquis , bisoprolol  BPH: home finasteride , tamsulosin  HLD: Home Crestor 

## 2024-05-10 NOTE — Progress Notes (Signed)
 Pt converted to NSR @ 1045

## 2024-05-10 NOTE — Progress Notes (Addendum)
 Daily Progress Note Intern Pager: [phone]  Patient name: Michael Robertson. Medical record number: [id number] Date of birth: 23-Jul-1945 Age: 79 y.o. Gender: male  Primary Care Provider: Madelon Donald HERO, DO Consultants: Cardiology Code Status: Full  Pt Overview and Major Events to Date:  10/21-admitted  Assessment and Plan:  78 year old male with PMH A-fib, COPD, GERD, CKD, BPH, T2DM, HLD who presented with generalized weakness and hypotension initially meeting SIRS criteria, now with improving leukocytosis, elevated procalcitonin, concerning for a respiratory source given patient patient's report of cough; however, no evidence of respiratory infection on initial imaging.  Patient on antibiotic coverage for CAP, but still fevered overnight.  Also with A-fib with RVR this morning though asymptomatic. Assessment & Plan Generalized weakness Fever COPD (chronic obstructive pulmonary disease) (HCC) Fevered overnight, despite CAP coverage for 48 hours, perhaps crackles in the left mid to lower lobe in addition to wheezes throughout all lung fields. On chronic azithromycin  outpatient. - Obtain MRSA swab - Add IV vancomycin for MRSA Coverage -Transition to IV Zosyn 3.375 g every 8 hours, for pseudomonal coverage given chronic antibiotic use outpatient - Chest x-ray today - AM CBC - Home O2 3-4L [place] - Cont home chronic prednisone  - home MWF azithromycin  - Cont home singulair , mometasone  - Schedule albuterol  nebs q6h  Atrial fibrillation with RVR (HCC) Patient well-appearing despite being in RVR this morning when I saw him at bedside.  Cardiology consulted, reviewed EKG, and felt that it was A-fib RVR.  Reported they would not see him as his A-fib is chronic. -Started diltiazem drip with 15 mg IV loading dose - will continue bisoporolol 10 mg at this time, pending rate control with diltiazem -AM BMP -Status post 500 cc fluid bolus Chronic health problem HTN: Cont home HCTZ, losartan    PAF: Cont home eliquis , bisoprolol  BPH: home finasteride , tamsulosin  HLD: Home Crestor   FEN/GI: regular PPx: eliquis  Dispo:Home pending clinical improvement .   Subjective:  He reports he feels well.  No increased SOB.  No chest pain.  No abdominal pain.  No dysuria.  He reports he only started coughing when he got admitted to the hospital and only coughs and get fevers when he is cold.  No night sweats.  No recent weight loss.   Objective: Temp:  [97.8 F (36.6 C)-102.2 F (39 C)] 99.6 F (37.6 C) (10/23 0708) Pulse Rate:  [81-159] 153 (10/23 0859) Resp:  [18-22] 22 (10/23 0859) BP: (101-140)/(62-80) 111/72 (10/23 0859) SpO2:  [94 %-100 %] 100 % (10/23 0859) Weight:  [68 kg] 68 kg (10/22 1442) Physical Exam: General: Well-appearing male lying in bed getting EKG CV: Tachycardic, irregular rhythm, 2+ radial pulse Respiratory: Expiratory wheezes in all lung fields, no increased work of breathing on nasal cannula, faint crackles in the left mid and lower lung field at the end of wheeze  Laboratory: Most recent CBC Lab Results  Component Value Date   WBC 18.5 (H) 05/10/2024   HGB 11.5 (L) 05/10/2024   HCT 37.3 (L) 05/10/2024   MCV 98.9 05/10/2024   PLT 207 05/10/2024   Most recent BMP    Latest Ref Rng & Units 05/10/2024    3:16 AM  BMP  Glucose 70 - 99 mg/dL 890   BUN 8 - 23 mg/dL 21   Creatinine 9.38 - 1.24 mg/dL 8.69   Sodium 864 - 854 mmol/L 140   Potassium 3.5 - 5.1 mmol/L 3.9   Chloride 98 - 111 mmol/L 105   CO2  22 - 32 mmol/L 26   Calcium  8.9 - 10.3 mg/dL 8.7    Imaging/Diagnostic Tests: EKG: Reviewed at bedside this morning tachycardic, heart rate around 150, narrow complex QRS, irregular rhythm, concerning for A-fib with RVR  Alena Morrison, Ellie Spickler, MD 05/10/2024, 9:12 AM  PGY-1, [facility] FPTS Intern pager: [phone], text pages welcome Secure chat group [facility] Teaching Service

## 2024-05-11 DIAGNOSIS — R531 Weakness: Secondary | ICD-10-CM | POA: Diagnosis not present

## 2024-05-11 LAB — BASIC METABOLIC PANEL WITH GFR
Anion gap: 10 (ref 5–15)
BUN: 26 mg/dL — ABNORMAL HIGH (ref 8–23)
CO2: 27 mmol/L (ref 22–32)
Calcium: 8.3 mg/dL — ABNORMAL LOW (ref 8.9–10.3)
Chloride: 102 mmol/L (ref 98–111)
Creatinine, Ser: 1.61 mg/dL — ABNORMAL HIGH (ref 0.61–1.24)
GFR, Estimated: 43 mL/min — ABNORMAL LOW (ref >60)
Glucose, Bld: 140 mg/dL — ABNORMAL HIGH (ref 70–99)
Potassium: 3.5 mmol/L (ref 3.5–5.1)
Sodium: 139 mmol/L (ref 135–145)

## 2024-05-11 LAB — CBC
HCT: 30.3 % — ABNORMAL LOW (ref 39.0–52.0)
Hemoglobin: 9.4 g/dL — ABNORMAL LOW (ref 13.0–17.0)
MCH: 30.5 pg (ref 26.0–34.0)
MCHC: 31 g/dL (ref 30.0–36.0)
MCV: 98.4 fL (ref 80.0–100.0)
Platelets: 170 K/uL (ref 150–400)
RBC: 3.08 MIL/uL — ABNORMAL LOW (ref 4.22–5.81)
RDW: 14.9 % (ref 11.5–15.5)
WBC: 13.7 K/uL — ABNORMAL HIGH (ref 4.0–10.5)
nRBC: 0 % (ref 0.0–0.2)

## 2024-05-11 MED ORDER — TAMSULOSIN HCL 0.4 MG PO CAPS
0.8000 mg | ORAL_CAPSULE | Freq: Every day | ORAL | Status: DC
  Administered 2024-05-11: 0.8 mg via ORAL
  Filled 2024-05-11: qty 2

## 2024-05-11 MED ORDER — LEVOFLOXACIN 250 MG PO TABS
250.0000 mg | ORAL_TABLET | Freq: Every day | ORAL | Status: DC
  Administered 2024-05-11 – 2024-05-12 (×2): 250 mg via ORAL
  Filled 2024-05-11 (×2): qty 1

## 2024-05-11 MED ORDER — BISOPROLOL FUMARATE 5 MG PO TABS
10.0000 mg | ORAL_TABLET | Freq: Once | ORAL | Status: AC
  Administered 2024-05-11: 10 mg via ORAL
  Filled 2024-05-11: qty 2

## 2024-05-11 MED ORDER — BISOPROLOL FUMARATE 5 MG PO TABS
20.0000 mg | ORAL_TABLET | Freq: Every day | ORAL | Status: DC
  Administered 2024-05-12: 20 mg via ORAL
  Filled 2024-05-11: qty 4

## 2024-05-11 MED ORDER — BUDESON-GLYCOPYRROL-FORMOTEROL 160-9-4.8 MCG/ACT IN AERO
2.0000 | INHALATION_SPRAY | Freq: Two times a day (BID) | RESPIRATORY_TRACT | Status: DC
  Filled 2024-05-11 (×2): qty 5.9

## 2024-05-11 MED ORDER — GUAIFENESIN ER 600 MG PO TB12
600.0000 mg | ORAL_TABLET | Freq: Two times a day (BID) | ORAL | Status: DC | PRN
  Administered 2024-05-11: 600 mg via ORAL
  Filled 2024-05-11: qty 1

## 2024-05-11 NOTE — Progress Notes (Signed)
 Mobility Specialist Progress Note;   05/11/24 1110  Mobility  Activity Pivoted/transferred from bed to chair;Ambulated with assistance  Level of Assistance Standby assist, set-up cues, supervision of patient - no hands on  Assistive Device None  Distance Ambulated (ft) 7 ft  Activity Response Tolerated well  Mobility Referral Yes  Mobility visit 1 Mobility  Mobility Specialist Start Time (ACUTE ONLY) 1110  Mobility Specialist Stop Time (ACUTE ONLY) 1123  Mobility Specialist Time Calculation (min) (ACUTE ONLY) 13 min   Pt agreeable to mobility. Required no physical assistance during ambulation in room, SV for safety. Ambulated on 2LO2, SPO2 89-92% throughout.  No c/o when asked. States feeling much better once sitting up. Pt left in chair with all needs met, call bell in reach. NT in room.   Lauraine Erm Mobility Specialist Please contact via SecureChat or [employer] [phone]

## 2024-05-11 NOTE — Assessment & Plan Note (Addendum)
 Rate controlled overnight -Increase bisoprolol  to 20 mg today and discontinue diltiazem - continue telemetry

## 2024-05-11 NOTE — Assessment & Plan Note (Addendum)
 No fever last 24 hours.  Vanc discontinued with negative MRSA swab - Transition to p.o. Levaquin  and resume home azithromycin  500 mg after levaquin  completion - start breztri, d/c nebs - lab holiday

## 2024-05-11 NOTE — Assessment & Plan Note (Signed)
 HTN: Cont home HCTZ, losartan   PAF: Cont home eliquis , bisoprolol  BPH: home finasteride , tamsulosin  HLD: Home Crestor 

## 2024-05-11 NOTE — Progress Notes (Signed)
     Daily Progress Note Intern Pager: [phone]  Patient name: Michael Robertson. Medical record number: [id number] Date of birth: 1945/12/13 Age: 78 y.o. Gender: male  Primary Care Provider: Madelon Donald HERO, DO Consultants: Cardiology Code Status: Full  Pt Overview and Major Events to Date:  10/21-admitted 10/23-Dilt drip started and stopped for RVR  Assessment and Plan:  78 year old male with PMH A-fib, COPD, GERD, CKD, BPH, T2DM, HLD who presented with generalized weakness and hypotension initially meeting SIRS criteria, now with improving leukocytosis, elevated procalcitonin, concerning for a respiratory source given patient patient's report of cough; however, no evidence of respiratory infection on initial imaging.  Patient fevered despite CAP Antibiotic coverage, so coverage expanded yesterday with no fevers since.  Will keep patient overnight for primary right observation with medication changes. Assessment & Plan Generalized weakness Fever COPD (chronic obstructive pulmonary disease) (HCC) No fever last 24 hours.  Vanc discontinued with negative MRSA swab - Transition to p.o. Levaquin  and resume home azithromycin  500 mg after levaquin  completion - start breztri, d/c nebs - lab holiday Atrial fibrillation with RVR (HCC) Rate controlled overnight -Increase bisoprolol  to 20 mg today and discontinue diltiazem - continue telemetry Chronic health problem HTN: Cont home HCTZ, losartan   PAF: Cont home eliquis , bisoprolol  BPH: home finasteride , tamsulosin  HLD: Home Crestor   FEN/GI: Regular PPx: Eliquis  Dispo:Home pending clinical improvement .   Subjective:  Reports his primary concern is his cough and lung/chest congestion.  Otherwise no pain.  Again reports no increased shortness of breath.  Objective: Temp:  [97 F (36.1 C)-99.6 F (37.6 C)] 99.3 F (37.4 C) (10/24 0450) Pulse Rate:  [82-153] 83 (10/24 0450) Resp:  [16-23] 18 (10/24 0450) BP: (100-143)/(58-91)  121/69 (10/24 0450) SpO2:  [92 %-100 %] 99 % (10/24 0450) FiO2 (%):  [28 %] 28 % (10/23 1926) Physical Exam: General: Appearing male sitting up in bed with nasal cannula CV: RRR Pulm: Wheezing in all lung fields, no increased work of breathing  Laboratory: Most recent CBC Lab Results  Component Value Date   WBC 13.7 (H) 05/11/2024   HGB 9.4 (L) 05/11/2024   HCT 30.3 (L) 05/11/2024   MCV 98.4 05/11/2024   PLT 170 05/11/2024   Most recent BMP    Latest Ref Rng & Units 05/11/2024    3:50 AM  BMP  Glucose 70 - 99 mg/dL 859   BUN 8 - 23 mg/dL 26   Creatinine 9.38 - 1.24 mg/dL 8.38   Sodium 864 - 854 mmol/L 139   Potassium 3.5 - 5.1 mmol/L 3.5   Chloride 98 - 111 mmol/L 102   CO2 22 - 32 mmol/L 27   Calcium  8.9 - 10.3 mg/dL 8.3    None  Imaging/Diagnostic Tests: Chest x-ray 05/10/2024 without evidence of acute pulmonary disease, emphysematous changes and lung hyperexpansion noted  Alena Morrison, Cashus Halterman, MD 05/11/2024, 8:06 AM  PGY-1, [facility] FPTS Intern pager: [phone], text pages welcome Secure chat group [facility] Teaching Service

## 2024-05-11 NOTE — Progress Notes (Signed)
 Occupational Therapy Treatment Patient Details Name: Joseluis Alessio. MRN: [id number] DOB: December 24, 1945 Today's Date: 05/11/2024   History of present illness Dariyon Urquilla. is a 78 y.o. male who presented to ED with generalized weakness and hypotension. PHMx: Afib, HTN, DM2, COPD, 2.5-3 L of O2 at home   OT comments  Pt making good progress toward set goals. PT was overall supervision walking in hallway with IV pole and completing basic adls in the room. No LOB noted during this session. Pt was on 3 L of O2 to ambulate and dropped O2 sats to 86%. Pt recovered to over 90% when 4L given during activity.  Noted in notes that this happened earlier this week as well. Pt may need more O2 during activity than what he was using at home if this persists.  Pt was put back on 3L when at rest and sats stayed above 90%. Spoke with pt about energy conservation techniques and hand out provided. Will continue to follow with goals to get to independent with all basic adls.       If plan is discharge home, recommend the following:  A little help with walking and/or transfers;A little help with bathing/dressing/bathroom;Help with stairs or ramp for entrance   Equipment Recommendations  None recommended by OT    Recommendations for Other Services      Precautions / Restrictions Precautions Precautions: Fall Recall of Precautions/Restrictions: Intact Restrictions Weight Bearing Restrictions Per Provider Order: No       Mobility Bed Mobility               General bed mobility comments: Sitting in chair    Transfers Overall transfer level: Modified independent Equipment used: None Transfers: Sit to/from Stand Sit to Stand: Modified independent (Device/Increase time)           General transfer comment: Pt with no LOB during transfers this am.     Balance Overall balance assessment: Mild deficits observed, not formally tested Sitting-balance support: No upper extremity supported, Feet  supported Sitting balance-Leahy Scale: Normal     Standing balance support: No upper extremity supported, During functional activity Standing balance-Leahy Scale: Fair                             ADL either performed or assessed with clinical judgement   ADL Overall ADL's : Needs assistance/impaired Eating/Feeding: Independent;Sitting   Grooming: Wash/dry hands;Wash/dry face;Oral care;Supervision/safety;Standing Grooming Details (indicate cue type and reason): Pt stood at sink to groom with no LOB for appx 3  min         Upper Body Dressing : Set up;Sitting   Lower Body Dressing: Supervision/safety;Sit to/from stand Lower Body Dressing Details (indicate cue type and reason): Pt donned pants and socks with supervision only for standing. Toilet Transfer: Supervision/safety;Ambulation;Comfort height toilet Toilet Transfer Details (indicate cue type and reason): Pt walked to bathroom without device and 3L O2 to toilet all with supervision. Pt was on 2L O2 and dropped to 86% O2 sats. Pt upped to 4L and returned to 93% and was returned to 3Lat rest at 93%. Toileting- Clothing Manipulation and Hygiene: Supervision/safety;Sit to/from stand       Functional mobility during ADLs: Supervision/safety General ADL Comments: Pt doing overall very well with adls. Pt did drop O2 sats while walking. Reviewed energy conservation tasks and pursed lip breathing techniques with pt.    Extremity/Trunk Assessment Upper Extremity Assessment Upper Extremity Assessment: Overall [facility]  for tasks assessed            Vision       Perception     Praxis     Communication Communication Communication: No apparent difficulties   Cognition Arousal: Alert Behavior During Therapy: WFL for tasks assessed/performed Cognition: No apparent impairments                               Following commands: Intact        Cueing      Exercises      Shoulder Instructions        General Comments Pt mobilizing well but did get SOB and drop O2 sats when walking.    Pertinent Vitals/ Pain       Pain Assessment Pain Assessment: No/denies pain  Home Living                                          Prior Functioning/Environment              Frequency  Min 2X/week        Progress Toward Goals  OT Goals(current goals can now be found in the care plan section)  Progress towards OT goals: Progressing toward goals  Acute Rehab OT Goals Patient Stated Goal: to be feeling stronger OT Goal Formulation: With patient Time For Goal Achievement: 05/22/24 Potential to Achieve Goals: Good ADL Goals Pt Will Perform Grooming: Independently;standing Pt Will Perform Upper Body Bathing: Independently;sitting;standing Pt Will Perform Lower Body Bathing: Independently;sit to/from stand Pt Will Perform Upper Body Dressing: Independently;sitting;standing Pt Will Perform Lower Body Dressing: Independently;sit to/from stand Pt Will Transfer to Toilet: Independently;ambulating Pt Will Perform Toileting - Clothing Manipulation and hygiene: Independently;sit to/from stand Additional ADL Goal #1: Pt will be aware of energy conservation strategies from 5 P's of Energy Conservation handout that will be of benefit to him  Plan      Co-evaluation                 AM-PAC OT 6 Clicks Daily Activity     Outcome Measure   Help from another person eating meals?: None Help from another person taking care of personal grooming?: A Little Help from another person toileting, which includes using toliet, bedpan, or urinal?: A Little Help from another person bathing (including washing, rinsing, drying)?: A Little Help from another person to put on and taking off regular upper body clothing?: A Little Help from another person to put on and taking off regular lower body clothing?: A Little 6 Click Score: 19    End of Session Equipment Utilized During  Treatment: Oxygen  (3L and then upped to 4L when walking)  OT Visit Diagnosis: Unsteadiness on feet (R26.81);Other abnormalities of gait and mobility (R26.89)   Activity Tolerance Patient limited by fatigue   Patient Left in chair;with call bell/phone within reach;with chair alarm set   Nurse Communication Mobility status;Other (comment) (Sats dropped to 86% after walking on 3 L. Per notes, this is not the first time this has happened.  May need more O2 to ambulate.)        Time: 8875-8855 OT Time Calculation (min): 20 min  Charges: OT General Charges $OT Visit: 1 Visit OT Treatments $Self Care/Home Management : 8-22 mins   Joshua Silvano Dragon 05/11/2024, 11:54 AM

## 2024-05-11 NOTE — Progress Notes (Signed)
 Physical Therapy Treatment Patient Details Name: Michael Robertson. MRN: [id number] DOB: [birth year] Today's Date: 05/11/2024   History of Present Illness Michael Theil. is a 78 y.o. male who presented to ED with generalized weakness and hypotension. PHMx: Afib, HTN, DM2, COPD, 2.5-3 L of O2 at home    PT Comments  Progress well back toward baseline.  Sats maintained fairly well in the low 90's to 90% with standing exercises, Rest in between activity.  SpO2 maintained at or above 90% first 100+ feet then started a decline on the return until about 80% on 4LNC at end of gait trial.   If plan is discharge home, recommend the following:  (PRN assist)   Can travel by private vehicle        Equipment Recommendations  None recommended by PT    Recommendations for Other Services       Precautions / Restrictions Precautions Precautions: Fall Recall of Precautions/Restrictions: Intact Restrictions Weight Bearing Restrictions Per Provider Order: No     Mobility  Bed Mobility Overal bed mobility: Independent             General bed mobility comments: left sitting EOB    Transfers Overall transfer level: Modified independent Equipment used: None Transfers: Sit to/from Stand Sit to Stand: Modified independent (Device/Increase time)           General transfer comment: Pt with no LOB during transfers, no need for UE assist    Ambulation/Gait   Gait Distance (Feet): 200 Feet Assistive device: None Gait Pattern/deviations: Step-through pattern   Gait velocity interpretation: 1.31 - 2.62 ft/sec, indicative of limited community ambulator   General Gait Details: steady gait overall, slower cadence to help maintain SpO2.  On the return   sats started dipping on 4L [place] to mid 80's and bottomed at 80 % at time of sitting.  2+ min to recover to 90%.  HR 90's and 100's bpm.   Stairs             Wheelchair Mobility     Tilt Bed    Modified Rankin (Stroke Patients  Only)       Balance Overall balance assessment: Mild deficits observed, not formally tested Sitting-balance support: No upper extremity supported, Feet supported Sitting balance-Leahy Scale: Normal     Standing balance support: No upper extremity supported, During functional activity Standing balance-Leahy Scale: Fair                              Hotel manager: No apparent difficulties  Cognition Arousal: Alert Behavior During Therapy: WFL for tasks assessed/performed   PT - Cognitive impairments: No apparent impairments                         Following commands: Intact      Cueing Cueing Techniques: Verbal cues, Tactile cues, Gestural cues  Exercises General Exercises - Lower Extremity Hip ABduction/ADduction: AROM, Both, 10 reps, Standing Hip Flexion/Marching: AROM, Both, 10 reps, Standing Toe Raises: AROM, 10 reps, Standing Heel Raises: AROM, 10 reps, Standing Mini-Sqauts: AROM, 10 reps, Standing    General Comments        Pertinent Vitals/Pain Pain Assessment Pain Assessment: No/denies pain    Home Living                          Prior Function  PT Goals (current goals can now be found in the care plan section) Acute Rehab PT Goals PT Goal Formulation: With patient Time For Goal Achievement: 05/16/24 Potential to Achieve Goals: Good Progress towards PT goals: Progressing toward goals    Frequency    Min 2X/week      PT Plan      Co-evaluation              AM-PAC PT 6 Clicks Mobility   Outcome Measure  Help needed turning from your back to your side while in a flat bed without using bedrails?: None Help needed moving from lying on your back to sitting on the side of a flat bed without using bedrails?: None Help needed moving to and from a bed to a chair (including a wheelchair)?: None Help needed standing up from a chair using your arms (e.g., wheelchair or  bedside chair)?: None Help needed to walk in hospital room?: A Little Help needed climbing 3-5 steps with a railing? : A Little 6 Click Score: 22    End of Session   Activity Tolerance: Patient tolerated treatment well;Patient limited by fatigue Patient left: in bed;with call bell/phone within reach Nurse Communication: Mobility status PT Visit Diagnosis: Difficulty in walking, not elsewhere classified (R26.2)     Time: 8247-8182 PT Time Calculation (min) (ACUTE ONLY): 25 min  Charges:    $Gait Training: 8-22 mins $Therapeutic Exercise: 8-22 mins PT General Charges $$ ACUTE PT VISIT: 1 Visit                     05/11/2024  [place] HERO., PT Acute Rehabilitation Services [phone]  (office)   Michael Robertson 05/11/2024, 6:24 PM

## 2024-05-12 ENCOUNTER — Other Ambulatory Visit (HOSPITAL_COMMUNITY): Payer: Self-pay

## 2024-05-12 DIAGNOSIS — R531 Weakness: Secondary | ICD-10-CM | POA: Diagnosis not present

## 2024-05-12 MED ORDER — BISOPROLOL FUMARATE 5 MG PO TABS
20.0000 mg | ORAL_TABLET | Freq: Every day | ORAL | 0 refills | Status: DC
  Filled 2024-05-12: qty 120, 30d supply, fill #0

## 2024-05-12 MED ORDER — LEVOFLOXACIN 250 MG PO TABS
250.0000 mg | ORAL_TABLET | Freq: Every day | ORAL | 0 refills | Status: DC
  Filled 2024-05-12: qty 1, 1d supply, fill #0

## 2024-05-12 NOTE — Assessment & Plan Note (Deleted)
 Afebrile, HDS. Stable for discharge. - Continue PO Levaquin  and resume home azithromycin  500 mg after levaquin  completion - Continue Breztri

## 2024-05-12 NOTE — Progress Notes (Signed)
 Mobility Specialist Progress Note:   05/12/24 1040  Mobility  Activity Ambulated with assistance  Level of Assistance Contact guard assist, steadying assist  Assistive Device None;Other (Comment) (handrails)  Distance Ambulated (ft) 150 ft  Activity Response Tolerated fair  Mobility Referral Yes  Mobility visit 1 Mobility  Mobility Specialist Start Time (ACUTE ONLY) 1040  Mobility Specialist Stop Time (ACUTE ONLY) 1052  Mobility Specialist Time Calculation (min) (ACUTE ONLY) 12 min   Pt agreeable to mobility session. Required only minG assist for safety throughout ambulation with no AD use. Pt intermittently held onto hallway handrails, c/o generalized unsteadiness. SpO2 dropped to 84% on 4LO2, recovered quickly with seated rest. Pt left with all needs met, back on 3LO2.   Therisa Rana Mobility Specialist Please contact via SecureChat or  Rehab office at [phone]

## 2024-05-12 NOTE — Assessment & Plan Note (Deleted)
 Rate controlled overnight - Continue bisoprolol  20 mg  - continue telemetry

## 2024-05-12 NOTE — Progress Notes (Signed)
 DISCHARGE NOTE HOME Michael Robertson. to be discharged Home per MD order. Discussed prescriptions and follow up appointments with the patient. Prescriptions given to patient; medication list explained in detail. Patient verbalized understanding.  Skin clean, dry and intact without evidence of skin break down, no evidence of skin tears noted. IV catheter discontinued intact. Site without signs and symptoms of complications. Dressing and pressure applied. Pt denies pain at the site currently. No complaints noted.  Patient free of lines, drains, and wounds.   An After Visit Summary (AVS) was printed and given to the patient. Patient escorted via wheelchair, and discharged home via private auto.  Peyton SHAUNNA Pepper, RN

## 2024-05-12 NOTE — Assessment & Plan Note (Deleted)
 HTN: Cont home HCTZ, losartan   PAF: Cont home eliquis , bisoprolol  BPH: home finasteride , tamsulosin  HLD: Home Crestor 

## 2024-05-12 NOTE — Discharge Summary (Addendum)
 Family Medicine Teaching [facility] Discharge Summary  Patient name: Michael Robertson. Medical record number: [id number] Date of birth: 1945-10-15 Age: 78 y.o. Gender: male Date of Admission: 05/08/2024  Date of Discharge: 05/12/24 Admitting Physician: Twyla Nearing, MD  Primary Care Provider: Madelon Donald HERO, DO Consultants: Cardiology  Indication for Hospitalization: Afib with RVR, Leukocytosis, Fever  Discharge Diagnoses/Problem List:  Principal Problem for Admission: Afib with RVR, fever Other Problems addressed during stay:  Principal Problem:   Generalized weakness Active Problems:   COPD (chronic obstructive pulmonary disease) (HCC)   Atrial fibrillation with RVR (HCC)   Chronic health problem   Fever   Brief Hospital Course:  This is a 78 year old male with PMH A-fib, COPD, GERD, CKD, BPH, T2DM, HLD who was admitted to the [facility] family medicine teaching service for A-fib with RVR and suspected pneumonia.  His hospital course is detailed below:  A-fib with RVR Patient reported tachycardia and fever the morning of admission; however, patient was rate controlled on arrival.  Hypotensive on arrival which improved with fluid resuscitation.  Outpatient bisoprolol  5 mg increased to 10 mg at time of admission.  Sustained RVR subsequently, and patient started on diltiazem drip with good rate control. Transitioned off drip to oral dilt. Bisoprolol  increased and oral diltiazem discontinued with good rate control.   Fever, leukocytosis Suspected pneumonia Patient reporting fever prior to admission and onset of cough during admission.  Sepsis protocol initiated given fever report, tachycardia, leukocytosis with left shift and elevated lactic acidosis.  Chest x-ray, CT chest/abdomen/pelvis without evidence of infectious source.  UA unremarkable.  Patient received IV fluid and IV Rocephin and azithromycin , which was continued for 48 hours when patient refevered.  Antibiotic  coverage expanded to vancomycin and Zosyn, given chronic antibiotic use outpatient.  MRSA swab negative and vancomycin discontinued.  Repeat chest x-ray obtained and did not reveal acute changes. No new fevers, antibiotics deescalated to levaquin  to complete a 5 day course ending on 10/26.  COPD Home daily prednisone  was continued during this admission. Home Monday Wednesday Friday azithromycin  was held when Levaquin  was started for prevention of Qtc prolonging affect.   PCP recommendations: Resume home azithromycin  Consider deescalation of beta-blocker once acute infection is resolved if  HR has normalized    Results/Tests Pending at Time of Discharge:  Unresulted Labs (From admission, onward)    None        Disposition: Home  Discharge Condition: Stable  Discharge Exam:  Vitals:   05/12/24 0525 05/12/24 0807  BP: (!) 142/76 134/70  Pulse: 86 99  Resp: 18 17  Temp: 98 F (36.7 C) 98.2 F (36.8 C)  SpO2: 99% 96%   General: Alert, well-appearing male in NAD.  Neck: Supple, normal ROM Cardiovascular: RRR, no m/r/g appreciated. Pulmonary: Normal WOB. CTAB with no w/c/r present.  Abdomen: Soft, non-tender, non-distended. Extremities: Warm and well-perfused, without cyanosis or edema. Psych: Appropriate mood and affect  Significant Procedures: None  Significant Labs and Imaging:  Recent Labs  Lab 05/11/24 0350  WBC 13.7*  HGB 9.4*  HCT 30.3*  PLT 170   Recent Labs  Lab 05/11/24 0350  NA 139  K 3.5  CL 102  CO2 27  GLUCOSE 140*  BUN 26*  CREATININE 1.61*  CALCIUM  8.3*    Pertinent Imaging  DG CHEST PORT 1 VIEW Result Date: 05/10/2024 1. No acute cardiopulmonary process.  2. Emphysema and bilateral perihilar scarring.  ECHOCARDIOGRAM COMPLETE Result Date: 05/09/2024  1. Left ventricular  ejection fraction, by estimation, is 70 to 75%. The left ventricle has hyperdynamic function. The left ventricle has no regional wall motion abnormalities. Left  ventricular diastolic parameters were normal.   2. Right ventricular systolic function is normal. The right ventricular size is normal. Tricuspid regurgitation signal is inadequate for assessing PA pressure.   3. The mitral valve is grossly normal. No evidence of mitral valve regurgitation. No evidence of mitral stenosis.   4. The aortic valve was not well visualized. There is mild calcification of the aortic valve. Aortic valve regurgitation is not visualized. No aortic stenosis is present.   5. The inferior vena cava is normal in size with greater than 50% respiratory variability, suggesting right atrial pressure of 3 mmHg.    CT CHEST ABDOMEN PELVIS W CONTRAST Result Date: 05/08/2024 1. No acute intrathoracic, intra-abdominal, or intrapelvic process.  2. Pulmonary emphysema is present. Pulmonary emphysema is an independent risk factor for lung cancer. Recommend patient be considered for lung cancer screening. [place]  Chief Financial Officer. Screening for Lung Cancer: [place]  Licensed Conveyancer. JAMA. 2021;325(10):962-970.  3. Aortic Atherosclerosis (ICD10-I70.0). Coronary artery atherosclerosis.  DG Chest 2 View Result Date: 05/08/2024 1. Stable emphysema and bibasilar scarring. No acute airspace disease.    Discharge Medications:  Allergies as of 05/12/2024       Reactions   Doxycycline    REACTION: vomiting        Medication List     TAKE these medications    apixaban  5 MG Tabs tablet Commonly known as: ELIQUIS  Take 1 tablet (5 mg total) by mouth 2 (two) times daily.   azithromycin  500 MG tablet Commonly known as: ZITHROMAX  Take 1 tablet (500 mg total) by mouth every Monday, Wednesday, and Friday.   bisoprolol  5 MG tablet Commonly known as: ZEBETA  Take 4 tablets (20 mg total) by mouth daily. Start taking on: May 13, 2024 What changed: how much to take   Blood Glucose Monitoring Suppl Devi 1 each by Does not apply route daily  as needed. May substitute to any manufacturer covered by patient's insurance.   CENTRUM SILVER ADULT 50+ PO Take 1 tablet by mouth daily.   cholecalciferol 1000 units tablet Commonly known as: VITAMIN D Take 1,000 Units by mouth daily.   ferrous gluconate  324 MG tablet Commonly known as: FERGON Take 1 tablet (324 mg total) by mouth daily.   finasteride  5 MG tablet Commonly known as: PROSCAR  Take 1 tablet (5 mg total) by mouth daily.   guaiFENesin -codeine  100-10 MG/5ML syrup TAKE 5 ML EVERY 6 HOURS AS NEEDED FOR COUGH   hydrochlorothiazide  12.5 MG tablet Commonly known as: HYDRODIURIL  TAKE 1 TABLET DAILY   Lancet Device Misc 1 each by Does not apply route daily as needed. May substitute to any manufacturer covered by patient's insurance.   levalbuterol  0.63 MG/3ML nebulizer solution Commonly known as: Xopenex  Take 3 mLs (0.63 mg total) by nebulization every 4 (four) hours as needed for wheezing or shortness of breath.   levofloxacin  250 MG tablet Commonly known as: LEVAQUIN  Take 1 tablet (250 mg total) by mouth daily. Start taking on: May 13, 2024   losartan  50 MG tablet Commonly known as: COZAAR  Take 50 mg by mouth daily.   mometasone  220 MCG/ACT inhaler Commonly known as: ASMANEX  Inhale 2 puffs into the lungs daily.   montelukast  10 MG tablet Commonly known as: SINGULAIR  TAKE 1 TABLET AT BEDTIME What changed: when to take this   omeprazole  40 MG capsule  Commonly known as: PRILOSEC Take 1 capsule (40 mg total) by mouth daily.   predniSONE  10 MG tablet Commonly known as: DELTASONE  Take 1 tablet (10 mg total) by mouth daily with breakfast. What changed: Another medication with the same name was removed. Continue taking this medication, and follow the directions you see here.   rosuvastatin  20 MG tablet Commonly known as: CRESTOR  TAKE 1 TABLET DAILY What changed: when to take this   tamsulosin  0.4 MG Caps capsule Commonly known as: FLOMAX  TAKE 2  CAPSULES DAILY AFTER SUPPER   traZODone  100 MG tablet Commonly known as: DESYREL  TAKE 1 TABLET AT BEDTIME   Ventolin  HFA 108 (90 Base) MCG/ACT inhaler Generic drug: albuterol  USE 2 INHALATIONS EVERY 4 HOURS AS NEEDED. RESCUE FOR SHORTNESS OF BREATH        Discharge Instructions: Please refer to Patient Instructions section of EMR for full details.  Patient was counseled important signs and symptoms that should prompt return to medical care, changes in medications, dietary instructions, activity restrictions, and follow up appointments.   Follow-Up Appointments: None  Jerrie Gathers, DO 05/12/2024, 11:53 AM PGY-1, [facility]  UL Attestation I have reviewed this discharge summary and attest that the information within is accurate and reflects the care provided by our team.  Lucie Pinal, DO PGY-2, Family Medicine

## 2024-05-12 NOTE — Discharge Instructions (Addendum)
 Dear Michael Robertson.,   Thank you for letting us  participate in your care! In this section, you will find a brief hospital admission summary of why you were admitted to the hospital, what happened during your admission, your diagnosis/diagnoses, and recommended follow up.   You were admitted for an elevated heart rate. This was controlled with an IV medication which we later stopped. We increased the dose of your home medication called Bisoprolol  to 20mg  which you should continue until you see your PCP.   You were also found to have an infection which we believe was pneumonia, a lung infection. We started you on antibiotics which you will need to continue for one more day (last dose on 10/26).   POST-HOSPITAL & CARE INSTRUCTIONS We recommend following up with your PCP within 1 week from being discharged from the hospital. Please let PCP/Specialists know of any changes in medications that were made which you will be able to see in the medications section of this packet. Resume home Azithromycin  on Monday-Wednesday-Friday Continue Bisoprolol  20mg  until you see your primary care doctor  DOCTOR'S APPOINTMENTS & FOLLOW UP Future Appointments  Date Time Provider Department Center  12/17/2024 11:10 AM [facility] ANNUAL WELLNESS VISIT [facility]     Thank you for choosing [facility]! Take care and be well!  Family Medicine Teaching Service Inpatient Team [place]  [facility]  [address] [place], [place] [address] [phone]

## 2024-05-13 DIAGNOSIS — I4891 Unspecified atrial fibrillation: Secondary | ICD-10-CM | POA: Diagnosis not present

## 2024-05-13 DIAGNOSIS — K5900 Constipation, unspecified: Secondary | ICD-10-CM | POA: Diagnosis not present

## 2024-05-13 DIAGNOSIS — D6869 Other thrombophilia: Secondary | ICD-10-CM | POA: Diagnosis not present

## 2024-05-13 DIAGNOSIS — N1831 Chronic kidney disease, stage 3a: Secondary | ICD-10-CM | POA: Diagnosis not present

## 2024-05-13 DIAGNOSIS — J9611 Chronic respiratory failure with hypoxia: Secondary | ICD-10-CM | POA: Diagnosis not present

## 2024-05-13 DIAGNOSIS — J439 Emphysema, unspecified: Secondary | ICD-10-CM | POA: Diagnosis not present

## 2024-05-13 DIAGNOSIS — K219 Gastro-esophageal reflux disease without esophagitis: Secondary | ICD-10-CM | POA: Diagnosis not present

## 2024-05-13 DIAGNOSIS — F1121 Opioid dependence, in remission: Secondary | ICD-10-CM | POA: Diagnosis not present

## 2024-05-13 DIAGNOSIS — E785 Hyperlipidemia, unspecified: Secondary | ICD-10-CM | POA: Diagnosis not present

## 2024-05-13 LAB — CULTURE, BLOOD (ROUTINE X 2)
Culture: NO GROWTH
Culture: NO GROWTH
Special Requests: ADEQUATE

## 2024-05-14 ENCOUNTER — Telehealth: Payer: Self-pay

## 2024-05-14 ENCOUNTER — Ambulatory Visit: Payer: Self-pay | Admitting: Family Medicine

## 2024-05-14 NOTE — Patient Instructions (Signed)
 Visit Information  Thank you for taking time to visit with me today. Please don't hesitate to contact me if I can be of assistance to you before our next scheduled telephone appointment.  Our next appointment is by telephone on 05/18/24 am  Following is a copy of your care plan:   Goals Addressed             This Visit's Progress    VBCI Transitions of Care (TOC) Care Plan       Problems:  Recent Hospitalization for treatment of Generalized weakness/Afib/?pneumonia Patient reported coughing up trace amount of blood  Goal:  Over the next 30 days, the patient will not experience hospital readmission  Interventions:  Transitions of Care: Doctor Visits  - discussed the importance of doctor visits Contacted provider for patient needs Urgent hospital follow up scheduled at PCP office tomorrow 05/15/24  AFIB Interventions:   Reviewed importance of adherence to anticoagulant exactly as prescribed Assessed social determinant of health barriers   COPD Interventions: Advised patient to track and manage COPD triggers Assessed social determinant of health barriers Discussed the importance of adequate rest and management of fatigue with COPD Provided education about and advised patient to utilize infection prevention strategies to reduce risk of respiratory infection Provided instruction about proper use of medications used for management of COPD including inhalers Use of home oxygen   Patient Self Care Activities:  Attend all scheduled provider appointments Call pharmacy for medication refills 3-7 days in advance of running out of medications Call provider office for new concerns or questions  Notify RN Care Manager of TOC call rescheduling needs Participate in Transition of Care Program/Attend TOC scheduled calls Take medications as prescribed   identify and remove indoor air pollutants limit outdoor activity during cold weather eliminate symptom triggers at home make a plan to  eat healthy take medicine as prescribed  Plan:  Telephone follow up appointment with care management team member scheduled for:  10/31am The patient has been provided with contact information for the care management team and has been advised to call with any health related questions or concerns.         Patient verbalizes understanding of instructions and care plan provided today and agrees to view in MyChart. Active MyChart status and patient understanding of how to access instructions and care plan via MyChart confirmed with patient.     Telephone follow up appointment with care management team member scheduled for: 10/31/25am The patient has been provided with contact information for the care management team and has been advised to call with any health related questions or concerns.   Please call the care guide team at [phone] if you need to cancel or reschedule your appointment.   Please call the Suicide and Crisis Lifeline: 988 call 1-800-273-TALK (toll free, 24 hour hotline) call 911 if you are experiencing a Mental Health or Behavioral Health Crisis or need someone to talk to.  Shona Prow RN, CCM [place]  VBCI-Population Health RN Care Manager [phone]

## 2024-05-14 NOTE — Telephone Encounter (Signed)
 FYI Only or Action Required?: FYI only for provider.  Patient was last seen in primary care on 03/09/2024 by Rumball, Alison M, DO.  Called Nurse Triage reporting Hemoptysis.  Symptoms began several days ago.  Triage Disposition: See Physician Within 24 Hours  Patient/caregiver understands and will follow disposition?: Yes             Copied from CRM #[id number]. Topic: Clinical - Red Word Triage >> May 14, 2024 11:25 AM Amy B wrote: Red Word that prompted transfer to Nurse Triage: Coughing up blood.  Discharged from hospital on Friday Reason for Disposition  Coughing up rusty-colored sputum  Answer Assessment - Initial Assessment Questions This RN recommends pt is seen within 24 hours. This RN transferred pt to correct office for Dr. Donald Lai.   Coughing up blood for a few days. Pt was discharged from hospital on Fri Happening twice a day Not a lot of blood, trace, light brown in color, probably a little less than when in hospital Denies difficulty breathing  FEVER: Do you have a fever? If Yes, ask: What is your temperature, how was it measured, and when did it start?     Denies  Protocols used: Coughing Up Blood-A-AH

## 2024-05-14 NOTE — Progress Notes (Deleted)
    SUBJECTIVE:   CHIEF COMPLAINT / HPI:    HFU Pt was admitted to [facility] from 10/21-10/25 for A-fib with RVR and suspected pneumonia For A-fib he was continued on his home bisoprolol  upon discharge For his pneumonia he was discharged on Levaquin  and his home azithromycin  3 times a week was held  Copied from DC summary: PCP recommendations: Resume home azithromycin  Consider deescalation of beta-blocker once acute infection is resolved if  HR has normalized    Discussed the use of AI scribe software for clinical note transcription with the patient, who gave verbal consent to proceed.  History of Present Illness      PERTINENT  PMH / PSH: ***  OBJECTIVE:   There were no vitals taken for this visit.   Physical Exam   ***  ASSESSMENT/PLAN:   Assessment and Plan Assessment & Plan     ***  Assessment & Plan    Payton Coward, MD [facility]

## 2024-05-14 NOTE — Transitions of Care (Post Inpatient/ED Visit) (Signed)
 05/14/2024  Name: Michael Robertson. MRN: [id number] DOB: [birth year]  Today's TOC FU Call Status: Today's TOC FU Call Status:: Successful TOC FU Call Completed TOC FU Call Complete Date: 05/14/24 Patient's Name and Date of Birth confirmed.  Transition Care Management Follow-up Telephone Call Date of Discharge: 05/12/24 Discharge Facility: [facility]) Type of Discharge: Inpatient Admission Primary Inpatient Discharge Diagnosis:: Generalized weakness How have you been since you were released from the hospital?: Same (Patient reports he went to the hospital with Afib and now is coughing up blood) Any questions or concerns?: No  Items Reviewed: Did you receive and understand the discharge instructions provided?: No Medications obtained,verified, and reconciled?: Yes (Medications Reviewed) Any new allergies since your discharge?: No Dietary orders reviewed?: No Do you have support at home?: Yes People in Home [RPT]: child(ren), adult Name of Support/Comfort Primary Source: son and daughter live in the home  Medications Reviewed Today: Medications Reviewed Today     Reviewed by Lauro Shona LABOR, RN (Registered Nurse) on 05/14/24 at 1212  Med List Status: <None>   Medication Order Taking? Sig Documenting Provider Last Dose Status Informant  apixaban  (ELIQUIS ) 5 MG TABS tablet [id number] Yes Take 1 tablet (5 mg total) by mouth 2 (two) times daily. Rumball, Alison M, DO  Active Self, Pharmacy Records           Med Note SANDIE, [place]ARY[place] J   Tue May 08, 2024  9:45 PM) [facility]  azithromycin  (ZITHROMAX ) 500 MG tablet [id number] Yes Take 1 tablet (500 mg total) by mouth every Monday, Wednesday, and Friday. Kara Dorn NOVAK, MD  Active Self, Pharmacy Records  bisoprolol  (ZEBETA ) 5 MG tablet [id number] Yes Take 4 tablets (20 mg total) by mouth daily. Cleotilde Lukes, DO  Active   Blood Glucose Monitoring Suppl DEVI [id number] Yes 1 each by Does not apply route daily as needed. May  substitute to any manufacturer covered by patient's insurance. Rumball, Alison M, DO  Active Self, Pharmacy Records  cholecalciferol (VITAMIN D) 1000 units tablet [id number] Yes Take 1,000 Units by mouth daily. [provider]  Active Self, Pharmacy Records  ferrous gluconate  [facility]) 324 MG tablet [id number] Yes Take 1 tablet (324 mg total) by mouth daily. May, Deanna J, NP  Active Self, Pharmacy Records  finasteride  (PROSCAR ) 5 MG tablet [id number] Yes Take 1 tablet (5 mg total) by mouth daily. Rumball, Alison M, DO  Active Self, Pharmacy Records  guaiFENesin -codeine  100-10 MG/5ML syrup [id number] Yes TAKE 5 ML EVERY 6 HOURS AS NEEDED FOR COUGH Rumball, Donald HERO, DO  Active Self, Pharmacy Records  hydrochlorothiazide  (HYDRODIURIL ) 12.5 MG tablet [id number] Yes TAKE 1 TABLET DAILY Rumball, Donald HERO, DO  Active Self, Pharmacy Records  Lancet Device MISC [id number] Yes 1 each by Does not apply route daily as needed. May substitute to any manufacturer covered by patient's insurance. Rumball, Alison M, DO  Active Self, Pharmacy Records  levalbuterol  (XOPENEX ) 0.63 MG/3ML nebulizer solution [id number] Yes Take 3 mLs (0.63 mg total) by nebulization every 4 (four) hours as needed for wheezing or shortness of breath. Rumball, Alison M, DO  Active Self, Pharmacy Records  levofloxacin  (LEVAQUIN ) 250 MG tablet [id number]  Take 1 tablet (250 mg total) by mouth daily.  Patient not taking: Reported on 05/14/2024   Cleotilde Lukes, DO  Active   losartan  (COZAAR ) 50 MG tablet [id number] Yes Take 50 mg by mouth daily. [provider]  Active Self, Pharmacy Records  mometasone  (ASMANEX ) 220 MCG/ACT inhaler [id number] Yes Inhale  2 puffs into the lungs daily. Kara Dorn NOVAK, MD  Active Self, Pharmacy Records  montelukast  (SINGULAIR ) 10 MG tablet [id number] Yes TAKE 1 TABLET AT BEDTIME Kara Dorn NOVAK, MD  Active Self, Pharmacy Records  Multiple Vitamins-Minerals (CENTRUM SILVER ADULT 50+ PO) [id number] Yes  Take 1 tablet by mouth daily.  [provider]  Active Self, Pharmacy Records  omeprazole  (PRILOSEC) 40 MG capsule [id number] Yes Take 1 capsule (40 mg total) by mouth daily. Rumball, Alison M, DO  Active Self, Pharmacy Records  predniSONE  (DELTASONE ) 10 MG tablet [id number] Yes Take 1 tablet (10 mg total) by mouth daily with breakfast. Kara Dorn NOVAK, MD  Active Self, Pharmacy Records  rosuvastatin  (CRESTOR ) 20 MG tablet [id number] Yes TAKE 1 TABLET DAILY Pray, Margaret E, MD  Active Self, Pharmacy Records  tamsulosin  (FLOMAX ) 0.4 MG CAPS capsule [id number] Yes TAKE 2 CAPSULES DAILY AFTER SUPPER Rumball, Alison M, DO  Active Self, Pharmacy Records  Tiotropium Bromide -Olodaterol (STIOLTO RESPIMAT ) 2.5-2.5 MCG/ACT AERS [id number] Yes Inhale into the lungs daily.  Patient taking differently: Inhale into the lungs daily. Patient reports prescribed by pulmonary   [provider]  Active   traZODone  (DESYREL ) 100 MG tablet [id number] Yes TAKE 1 TABLET AT BEDTIME Rumball, Alison M, DO  Active Self, Pharmacy Records  VENTOLIN  HFA 108 [phone] Base) MCG/ACT inhaler [id number]  USE 2 INHALATIONS EVERY 4 HOURS AS NEEDED. RESCUE FOR SHORTNESS OF BREATH  Patient not taking: Reported on 05/14/2024   Rosalynn Camie CROME, MD  Active Self, Pharmacy Records            Home Care and Equipment/Supplies: Were Home Health Services Ordered?: No Any new equipment or medical supplies ordered?: No  Functional Questionnaire: Do you need assistance with bathing/showering or dressing?: Yes (children are helping as needed) Do you need assistance with meal preparation?: Yes (daughter does the cooking) Do you need assistance with eating?: No Do you have difficulty maintaining continence: No Do you need assistance with getting out of bed/getting out of a chair/moving?: No Do you have difficulty managing or taking your medications?: No  Follow up appointments reviewed: PCP Follow-up appointment confirmed?:  Yes Date of PCP follow-up appointment?: 05/15/24 Follow-up Provider: PCP office - seeing [facility] Follow-up appointment confirmed?: NA Do you need transportation to your follow-up appointment?: No Do you understand care options if your condition(s) worsen?: Yes-patient verbalized understanding  SDOH Interventions Today    Flowsheet Row Most Recent Value  SDOH Interventions   Food Insecurity Interventions Intervention Not Indicated  Housing Interventions Intervention Not Indicated  Transportation Interventions Intervention Not Indicated  Utilities Interventions Intervention Not Indicated    Goals Addressed             This Visit's Progress    VBCI Transitions of Care (TOC) Care Plan       Problems:  Recent Hospitalization for treatment of Generalized weakness/Afib/?pneumonia Patient reported coughing up trace amount of blood  Goal:  Over the next 30 days, the patient will not experience hospital readmission  Interventions:  Transitions of Care: Doctor Visits  - discussed the importance of doctor visits Contacted provider for patient needs Urgent hospital follow up scheduled at PCP office tomorrow 05/15/24  AFIB Interventions:   Reviewed importance of adherence to anticoagulant exactly as prescribed Assessed social determinant of health barriers   COPD Interventions: Advised patient to track and manage COPD triggers Assessed social determinant of health barriers Discussed the importance of adequate rest and management of fatigue with COPD  Provided education about and advised patient to utilize infection prevention strategies to reduce risk of respiratory infection Provided instruction about proper use of medications used for management of COPD including inhalers Use of home oxygen   Patient Self Care Activities:  Attend all scheduled provider appointments Call pharmacy for medication refills 3-7 days in advance of running out of medications Call  provider office for new concerns or questions  Notify RN Care Manager of TOC call rescheduling needs Participate in Transition of Care Program/Attend TOC scheduled calls Take medications as prescribed   identify and remove indoor air pollutants limit outdoor activity during cold weather eliminate symptom triggers at home make a plan to eat healthy take medicine as prescribed  Plan:  Telephone follow up appointment with care management team member scheduled for:  10/31am The patient has been provided with contact information for the care management team and has been advised to call with any health related questions or concerns.         Shona Prow RN, CCM [place]  VBCI-Population Health RN Care Manager [phone]

## 2024-05-15 ENCOUNTER — Inpatient Hospital Stay: Admitting: Family Medicine

## 2024-05-17 ENCOUNTER — Telehealth: Payer: Self-pay

## 2024-05-17 NOTE — Progress Notes (Unsigned)
    SUBJECTIVE:   CHIEF COMPLAINT / HPI:   HOSPITAL FOLLOW UP Hospital/facility: [facility] 10/21-10/25 Diagnosis: PNA, Afib w/RVR Procedures/tests: noncontributory Consultants:  New medications:  - bisoprolol  increased - levaquin  ending 10/26 Discharge instructions:   - resume home azithromycin  - consider dose decrease of bisoprolol  prn Status: better - breathing still short but cough not as productive - back on azithromycin  - no racing heart rates - compliant with bisoprolol  - needs to call for Pulm, Cardiology appt - had to cancel previous colonoscopy for anemia due to episode of Afib with RVR after cleanout. Did not seek care for this. - inquiring about HH aide to help with bathing. Thinks there may be a service through the [place], will look into that and let me know if there is anything he needs from me.    OBJECTIVE:   BP 119/74   Pulse 99   Wt 146 lb 9.6 oz (66.5 kg)   SpO2 95%   BMI 20.45 kg/m   Gen: chronically ill appearing, in NAD Card: Reg rate, irregular rhythm Lungs: distant breath sounds, very minimal wheeze in RUL Ext: WWP, no edema  ASSESSMENT/PLAN:   Chronic respiratory failure with hypoxia (HCC) With PNA now resolved. Appears back to respiratory baseline with residual dry cough, SOB. Counseled this may persist for a few weeks before returning to baseline. Encouraged Pulm followup. Continue inhalers, supplemental O2, daily azithromycin  and prednisone . Due for lung cancer screening, CT chest order placed.  Atrial fibrillation with RVR (HCC) Remains rate controlled though high end of normal, no changes to bisoprolol  today.   DM (diabetes mellitus), type 2 with renal complications (HCC) Diet controlled. A1c 6.9 today. No changes. On chronic steroids.   CKD (chronic kidney disease) stage 3, GFR 30-59 ml/min (HCC) Recheck labs.  Anemia Encouraged to call to reschedule EGD/colonoscopy.   Hematoma To R forearm from IV site, ~2cm. No evidence of ongoing  bleed. Continue to monitor.  Donald CHRISTELLA Lai, DO

## 2024-05-17 NOTE — Telephone Encounter (Signed)
 Patient calls nurse line regarding concerns with area on his right arm. He was recently discharged from the hospital. He noticed a small knot where his IV was removed.   There is no redness, warmth, drainage or pain. Denies fever or chills.    Advised of supportive measures.   Patient has visit with Dr. Madelon tomorrow. Advised that he could have this evaluated at tomorrow's visit.   Patient voices understanding.   Chiquita JAYSON English, RN

## 2024-05-17 NOTE — Transitions of Care (Post Inpatient/ED Visit) (Signed)
 Transition of Care Week #1 follow up  Visit Note  05/17/2024  Name: Michael Robertson. MRN: [id number]          DOB: 04-Jun-1946  Situation: Patient enrolled in [facility] 30-day program. Visit completed with patient by telephone.   Background: Patient reported hemoptysis with 05/14/24 call - TOC made call to follow up  Initial Transition Care Management Follow-up Telephone Call Discharge Date and Diagnosis: 05/12/24, Generalized weakness   Past Medical History:  Diagnosis Date   Anemia    Anxiety    Atrial fibrillation (HCC)    Chronic kidney disease    COPD (chronic obstructive pulmonary disease) (HCC)    COPD exacerbation (HCC) 05/07/2010   Qualifier: Diagnosis of   By: Scarlet MD, Elsie         GERD (gastroesophageal reflux disease)    Hyperlipidemia    Hypertension    Insomnia    Oxygen  deficiency    Pneumonia    Pre-diabetes    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    Assessment: Patient Reported Symptoms: Cognitive Cognitive Status: No symptoms reported, Normal speech and language skills, Alert and oriented to person, place, and time      Neurological Neurological Review of Symptoms: No symptoms reported    HEENT HEENT Symptoms Reported: No symptoms reported      Cardiovascular Cardiovascular Symptoms Reported: No symptoms reported Weight: 145 lb (65.8 kg) (patient reports weight varies between home and MD office) Cardiovascular Comment: patient denies any symptoms and reports BP 126/65  HR 88  Respiratory Respiratory Symptoms Reported: Shortness of breath Additional Respiratory Details: Patient reports he has had no further trace blood with cough and states coughing has decreased and he's currently on 3 liters O2  and reports O2 saturation is 98% Respiratory Management Strategies: Oxygen  therapy, Medication therapy, Adequate rest  Endocrine Endocrine Symptoms Reported: No symptoms reported List most recent blood sugar readings, include date and time of day: Patient  reports his sugar today is 147    Gastrointestinal Gastrointestinal Symptoms Reported: No symptoms reported Additional Gastrointestinal Details: Patient states he has drank juice and reports bowels are now moving without issue      Genitourinary Genitourinary Symptoms Reported: Difficulty initiating stream    Integumentary Integumentary Symptoms Reported: Other Other Integumentary Symptoms: right arm as hardness at IV site from hospitalizaion - is using ice and reports it has improved    Musculoskeletal Musculoskelatal Symptoms Reviewed: No symptoms reported   Falls in the past year?: Yes (patient states he was going down the steps and legs gave away - denied injury) Number of falls in past year: 1 or less Was there an injury with Fall?: No Fall Risk Category Calculator: 1 Patient Fall Risk Level: Low Fall Risk Patient at Risk for Falls Due to: History of fall(s) Fall risk Follow up: Falls prevention discussed, Education provided, Falls evaluation completed  Psychosocial Psychosocial Symptoms Reported: No symptoms reported         There were no vitals filed for this visit.  Medications Reviewed Today     Reviewed by Lauro Shona LABOR, RN (Registered Nurse) on 05/17/24 at 1523  Med List Status: <None>   Medication Order Taking? Sig Documenting Provider Last Dose Status Informant  apixaban  (ELIQUIS ) 5 MG TABS tablet [id number] Yes Take 1 tablet (5 mg total) by mouth 2 (two) times daily. Rumball, Alison M, DO  Active Self, Pharmacy Records           Med Note [place], [place] JINNY  Tue May 08, 2024  9:45 PM) [facility]  azithromycin  (ZITHROMAX ) 500 MG tablet [id number] Yes Take 1 tablet (500 mg total) by mouth every Monday, Wednesday, and Friday. Kara Dorn NOVAK, MD  Active Self, Pharmacy Records  bisoprolol  (ZEBETA ) 5 MG tablet [id number] Yes Take 4 tablets (20 mg total) by mouth daily. Cleotilde Lukes, DO  Active   Blood Glucose Monitoring Suppl DEVI [id number] Yes 1 each by Does  not apply route daily as needed. May substitute to any manufacturer covered by patient's insurance. Rumball, Alison M, DO  Active Self, Pharmacy Records  cholecalciferol (VITAMIN D) 1000 units tablet [id number] Yes Take 1,000 Units by mouth daily. [provider]  Active Self, Pharmacy Records  ferrous gluconate  [facility]) 324 MG tablet [id number] Yes Take 1 tablet (324 mg total) by mouth daily. May, Deanna J, NP  Active Self, Pharmacy Records  finasteride  (PROSCAR ) 5 MG tablet [id number] Yes Take 1 tablet (5 mg total) by mouth daily. Rumball, Alison M, DO  Active Self, Pharmacy Records  guaiFENesin -codeine  100-10 MG/5ML syrup [id number] Yes TAKE 5 ML EVERY 6 HOURS AS NEEDED FOR COUGH Madelon Donald HERO, DO  Active Self, Pharmacy Records  hydrochlorothiazide  (HYDRODIURIL ) 12.5 MG tablet [id number] Yes TAKE 1 TABLET DAILY Rumball, Donald HERO, DO  Active Self, Pharmacy Records  Lancet Device MISC [id number] Yes 1 each by Does not apply route daily as needed. May substitute to any manufacturer covered by patient's insurance. Rumball, Alison M, DO  Active Self, Pharmacy Records  levalbuterol  (XOPENEX ) 0.63 MG/3ML nebulizer solution [id number] Yes Take 3 mLs (0.63 mg total) by nebulization every 4 (four) hours as needed for wheezing or shortness of breath. Rumball, Alison M, DO  Active Self, Pharmacy Records  levofloxacin  (LEVAQUIN ) 250 MG tablet [id number]  Take 1 tablet (250 mg total) by mouth daily.  Patient not taking: Reported on 05/17/2024   Cleotilde Lukes, DO  Active   losartan  (COZAAR ) 50 MG tablet [id number] Yes Take 50 mg by mouth daily. [provider]  Active Self, Pharmacy Records  mometasone  (ASMANEX ) 220 MCG/ACT inhaler [id number] Yes Inhale 2 puffs into the lungs daily. Kara Dorn NOVAK, MD  Active Self, Pharmacy Records  montelukast  (SINGULAIR ) 10 MG tablet [id number] Yes TAKE 1 TABLET AT BEDTIME Kara Dorn NOVAK, MD  Active Self, Pharmacy Records  Multiple Vitamins-Minerals  (CENTRUM SILVER ADULT 50+ PO) [id number] Yes Take 1 tablet by mouth daily.  [provider]  Active Self, Pharmacy Records  omeprazole  [facility]) 40 MG capsule [id number] Yes Take 1 capsule (40 mg total) by mouth daily. Rumball, Alison M, DO  Active Self, Pharmacy Records  predniSONE  (DELTASONE ) 10 MG tablet [id number] Yes Take 1 tablet (10 mg total) by mouth daily with breakfast. Kara Dorn NOVAK, MD  Active Self, Pharmacy Records  rosuvastatin  (CRESTOR ) 20 MG tablet [id number] Yes TAKE 1 TABLET DAILY Pray, Rollene BRAVO, MD  Active Self, Pharmacy Records  tamsulosin  (FLOMAX ) 0.4 MG CAPS capsule [id number] Yes TAKE 2 CAPSULES DAILY AFTER SUPPER Rumball, Alison M, DO  Active Self, Pharmacy Records  Tiotropium Bromide -Olodaterol (STIOLTO RESPIMAT ) 2.5-2.5 MCG/ACT AERS [id number] Yes Inhale into the lungs daily. [provider]  Active   traZODone  (DESYREL ) 100 MG tablet [id number] Yes TAKE 1 TABLET AT BEDTIME Rumball, Alison M, DO  Active Self, Pharmacy Records  VENTOLIN  HFA 108 [phone] Base) MCG/ACT inhaler [id number]  USE 2 INHALATIONS EVERY 4 HOURS AS NEEDED. RESCUE FOR SHORTNESS OF BREATH  Patient not taking: Reported on 05/17/2024   Rosalynn Camie CROME, MD  Active  Self, Pharmacy Records            Recommendation:   Continue Current Plan of Care  Follow Up Plan:   Telephone follow up appointment date/time:  05/25/24 in the morning  Shona Prow RN, CCM [place]  VBCI-Population Health RN Care Manager [phone]

## 2024-05-17 NOTE — Patient Instructions (Signed)
 Visit Information  Thank you for taking time to visit with me today. Please don't hesitate to contact me if I can be of assistance to you before our next scheduled telephone appointment.  Our next appointment is by telephone on 05/25/24 in the morning  Following is a copy of your care plan:   Goals Addressed             This Visit's Progress    VBCI Transitions of Care (TOC) Care Plan       Problems:  Recent Hospitalization for treatment of Generalized weakness/Afib/?pneumonia Patient reported coughing up trace amount of blood - update 05/17/24 patient reports this has resolved   Goal:  Over the next 30 days, the patient will not experience hospital readmission  Interventions:  Transitions of Care: Doctor Visits  - discussed the importance of doctor visits Contacted provider for patient needs Urgent hospital follow up scheduled at PCP office tomorrow 05/15/24 - Update 05/17/24 patient states he cancelled the appt and is seeing his usual PCP, Donald Lai tomorrow.  TOC RN was reviewing chart and made call 05/17/24 - patient agreed to call. Patient reports he is no longer coughing up blood and states he is currently playing [employer] on his computer and that he likes to pass time doing this. Patient reports he called Md office regarding knot on his right arm from the site where he had an IV line in the hospital and states it does not hurt and he was instructed to apply ice and states it has gone down but he will show it to his PCP tomorrow. Patient states his sugar is 147 today bp 126/65 hr 88 pulse ox is 98% - Patient feels stronger and is agreeable to ongoing TOC RN follow up   AFIB Interventions:   Reviewed importance of adherence to anticoagulant exactly as prescribed Assessed social determinant of health barriers   COPD Interventions: Advised patient to track and manage COPD triggers Assessed social determinant of health barriers Discussed the importance of adequate rest and  management of fatigue with COPD Provided education about and advised patient to utilize infection prevention strategies to reduce risk of respiratory infection Provided instruction about proper use of medications used for management of COPD including inhalers Use of home oxygen   Patient Self Care Activities:  Attend all scheduled provider appointments Call pharmacy for medication refills 3-7 days in advance of running out of medications Call provider office for new concerns or questions  Notify RN Care Manager of TOC call rescheduling needs Participate in Transition of Care Program/Attend TOC scheduled calls Take medications as prescribed   identify and remove indoor air pollutants limit outdoor activity during cold weather eliminate symptom triggers at home make a plan to eat healthy take medicine as prescribed  Plan:  Telephone follow up appointment with care management team member scheduled for:  11/7am The patient has been provided with contact information for the care management team and has been advised to call with any health related questions or concerns.         Patient verbalizes understanding of instructions and care plan provided today and agrees to view in MyChart. Active MyChart status and patient understanding of how to access instructions and care plan via MyChart confirmed with patient.     Telephone follow up appointment with care management team member scheduled for:05/25/24 The patient has been provided with contact information for the care management team and has been advised to call with any health related questions or concerns.  Please call the care guide team at [phone] if you need to cancel or reschedule your appointment.   Please call the Suicide and Crisis Lifeline: 988 call 1-800-273-TALK (toll free, 24 hour hotline) call 911 if you are experiencing a Mental Health or Behavioral Health Crisis or need someone to talk to.  Shona Prow RN,  CCM [place]  VBCI-Population Health RN Care Manager [phone]

## 2024-05-18 ENCOUNTER — Encounter: Payer: Self-pay | Admitting: Family Medicine

## 2024-05-18 ENCOUNTER — Ambulatory Visit (INDEPENDENT_AMBULATORY_CARE_PROVIDER_SITE_OTHER): Admitting: Family Medicine

## 2024-05-18 VITALS — BP 119/74 | HR 99 | Wt 146.6 lb

## 2024-05-18 DIAGNOSIS — D649 Anemia, unspecified: Secondary | ICD-10-CM

## 2024-05-18 DIAGNOSIS — I4891 Unspecified atrial fibrillation: Secondary | ICD-10-CM | POA: Diagnosis not present

## 2024-05-18 DIAGNOSIS — J9611 Chronic respiratory failure with hypoxia: Secondary | ICD-10-CM | POA: Diagnosis not present

## 2024-05-18 DIAGNOSIS — E1122 Type 2 diabetes mellitus with diabetic chronic kidney disease: Secondary | ICD-10-CM | POA: Diagnosis not present

## 2024-05-18 DIAGNOSIS — J439 Emphysema, unspecified: Secondary | ICD-10-CM | POA: Diagnosis not present

## 2024-05-18 DIAGNOSIS — N1831 Chronic kidney disease, stage 3a: Secondary | ICD-10-CM | POA: Diagnosis not present

## 2024-05-18 DIAGNOSIS — R911 Solitary pulmonary nodule: Secondary | ICD-10-CM

## 2024-05-18 DIAGNOSIS — T148XXA Other injury of unspecified body region, initial encounter: Secondary | ICD-10-CM

## 2024-05-18 LAB — POCT GLYCOSYLATED HEMOGLOBIN (HGB A1C): HbA1c, POC (controlled diabetic range): 6.9 % (ref 0.0–7.0)

## 2024-05-18 NOTE — Assessment & Plan Note (Addendum)
 With PNA now resolved. Appears back to respiratory baseline with residual dry cough, SOB. Counseled this may persist for a few weeks before returning to baseline. Encouraged Pulm followup. Continue inhalers, supplemental O2, daily azithromycin  and prednisone . Due for lung cancer screening, CT chest order placed.

## 2024-05-18 NOTE — Assessment & Plan Note (Signed)
 Remains rate controlled though high end of normal, no changes to bisoprolol  today.

## 2024-05-18 NOTE — Assessment & Plan Note (Signed)
 Encouraged to call to reschedule EGD/colonoscopy.

## 2024-05-18 NOTE — Assessment & Plan Note (Signed)
 Recheck labs

## 2024-05-18 NOTE — Assessment & Plan Note (Signed)
 Diet controlled. A1c 6.9 today. No changes. On chronic steroids.

## 2024-05-18 NOTE — Patient Instructions (Signed)
 It was great to see you!  Our plans for today:  - Make appointments with your Pulmonologist, Cardiologist, and Gastroenterologist. - Let me know if your arm gets worse or if you need anything from me with your aide.  - No changes to your medications.  - We ordered a CT scan of your lungs. Let us  know if you don't hear about an appointment in the next few weeks.   We are checking some labs today, we will release these results to your MyChart.  Take care and seek immediate care sooner if you develop any concerns.   Dr. Askari Kinley

## 2024-05-19 LAB — BASIC METABOLIC PANEL WITH GFR
BUN/Creatinine Ratio: 21 (ref 10–24)
BUN: 23 mg/dL (ref 8–27)
CO2: 25 mmol/L (ref 20–29)
Calcium: 9.4 mg/dL (ref 8.6–10.2)
Chloride: 98 mmol/L (ref 96–106)
Creatinine, Ser: 1.11 mg/dL (ref 0.76–1.27)
Glucose: 102 mg/dL — ABNORMAL HIGH (ref 70–99)
Potassium: 5.1 mmol/L (ref 3.5–5.2)
Sodium: 140 mmol/L (ref 134–144)
eGFR: 68 mL/min/1.73 (ref >59)

## 2024-05-19 LAB — CBC
Hematocrit: 37.5 % (ref 37.5–51.0)
Hemoglobin: 11.7 g/dL — ABNORMAL LOW (ref 13.0–17.7)
MCH: 30.9 pg (ref 26.6–33.0)
MCHC: 31.2 g/dL — ABNORMAL LOW (ref 31.5–35.7)
MCV: 99 fL — ABNORMAL HIGH (ref 79–97)
Platelets: 591 x10E3/uL — ABNORMAL HIGH (ref 150–450)
RBC: 3.79 x10E6/uL — ABNORMAL LOW (ref 4.14–5.80)
RDW: 13.2 % (ref 11.6–15.4)
WBC: 19 x10E3/uL — ABNORMAL HIGH (ref 3.4–10.8)

## 2024-05-21 ENCOUNTER — Telehealth: Payer: Self-pay

## 2024-05-21 ENCOUNTER — Ambulatory Visit: Payer: Self-pay | Admitting: Family Medicine

## 2024-05-21 NOTE — Telephone Encounter (Signed)
 Patient called to schedule an appointment with Dr. Kara but nothing was available until 06/22/24.  I scheduled him and put him on a wait list.  Didn't know if you all had anything sooner.  Thanks.

## 2024-05-21 NOTE — Telephone Encounter (Signed)
 noted

## 2024-05-22 ENCOUNTER — Ambulatory Visit
Admission: RE | Admit: 2024-05-22 | Discharge: 2024-05-22 | Disposition: A | Source: Ambulatory Visit | Attending: Family Medicine

## 2024-05-22 DIAGNOSIS — R911 Solitary pulmonary nodule: Secondary | ICD-10-CM

## 2024-05-22 DIAGNOSIS — J439 Emphysema, unspecified: Secondary | ICD-10-CM

## 2024-05-23 ENCOUNTER — Other Ambulatory Visit: Payer: Self-pay

## 2024-05-24 MED ORDER — FINASTERIDE 5 MG PO TABS: 5.0000 mg | ORAL_TABLET | Freq: Every day | ORAL | 3 refills | Status: AC

## 2024-05-25 ENCOUNTER — Telehealth: Payer: Self-pay

## 2024-05-25 NOTE — Patient Instructions (Signed)
 Visit Information  Thank you for taking time to visit with me today. Please don't hesitate to contact me if I can be of assistance to you before our next scheduled telephone appointment.  Our next appointment is by telephone on 05/31/24 in the morning  Following is a copy of your care plan:   Goals Addressed             This Visit's Progress    VBCI Transitions of Care (TOC) Care Plan       Problems:  Recent Hospitalization for treatment of Generalized weakness/Afib/?pneumonia Patient reported coughing up trace amount of blood - update 05/17/24 patient reports this has resolved   Goal:  Over the next 30 days, the patient will not experience hospital readmission  Interventions:  Transitions of Care: Doctor Visits  - discussed the importance of doctor visits Contacted provider for patient needs Urgent hospital follow up scheduled at PCP office tomorrow 05/15/24 - Update 05/17/24 patient states he cancelled the appt and is seeing his usual PCP, Donald Lai tomorrow.  TOC RN was reviewing chart and made call 05/17/24 - patient agreed to call. Patient reports he is no longer coughing up blood and states he is currently playing [employer] on his computer and that he likes to pass time doing this. Patient reports he called Md office regarding knot on his right arm from the site where he had an IV line in the hospital and states it does not hurt and he was instructed to apply ice and states it has gone down but he will show it to his PCP tomorrow. Patient states his sugar is 147 today bp 126/65 hr 88 pulse ox is 98% - Patient feels stronger and is agreeable to ongoing TOC RN follow up  Update 05/25/24: Patient confirmed he had office visit with Dr Lai 05/18/24 with instructions to make appointments with your Pulmonologist, Cardiologist, and Gastroenterologist. Patient states he has a pulmonology appt with Dr. Kara 06/06/24 and states he had CT scan as well and that his PCP is out of office  but Dr Suzann Daring called with results of labs and he is still waiting on CT results. Patient reports BP low today (see assessment) but states his doctor is aware that BP runs low at times and patient denied any symptoms and reported improved BP after talking with TOC RN another 10 minutes. Patient encouraged to be aware of how much fluid he is taking in and patient began drinking water during the call. Patient states he will make the other suggested appts but wants to see pulm first.  Inbasket message was sent to [facility] RN Team: [facility] RN placed call to patient for weekly follow up. During call patient checked BP and reported 99/48 and denied any symptoms. Patient started drinking water and rechecked BP around 10 minutes later and reported 108/52. Patient states PCP is aware his BP runs low and continues to deny symptoms and stated his personal BP cuff usually is lower than when checked at MD office. Patient was encouraged to call  with any symptoms or if BP drops further and to take his BP cuff to next provider appointment. Patient declined outreach from [facility] RN until later next week and said he is not worried about this BP.  AFIB Interventions:   Reviewed importance of adherence to anticoagulant exactly as prescribed Assessed social determinant of health barriers   COPD Interventions: Advised patient to track and manage COPD triggers Assessed social determinant of health barriers Discussed the  importance of adequate rest and management of fatigue with COPD Provided education about and advised patient to utilize infection prevention strategies to reduce risk of respiratory infection Provided instruction about proper use of medications used for management of COPD including inhalers Use of home oxygen   Patient Self Care Activities:  Attend all scheduled provider appointments Call pharmacy for medication refills 3-7 days in advance of running out of medications Call provider office for new concerns or  questions  Notify RN Care Manager of TOC call rescheduling needs Participate in Transition of Care Program/Attend TOC scheduled calls Take medications as prescribed   identify and remove indoor air pollutants limit outdoor activity during cold weather eliminate symptom triggers at home make a plan to eat healthy take medicine as prescribed  Plan:  Telephone follow up appointment with care management team member scheduled for:  11/13am The patient has been provided with contact information for the care management team and has been advised to call with any health related questions or concerns.         Patient verbalizes understanding of instructions and care plan provided today and agrees to view in MyChart. Active MyChart status and patient understanding of how to access instructions and care plan via MyChart confirmed with patient.     Telephone follow up appointment with care management team member scheduled for: 05/31/24 The patient has been provided with contact information for the care management team and has been advised to call with any health related questions or concerns.   Please call the care guide team at [phone] if you need to cancel or reschedule your appointment.   Please call the Suicide and Crisis Lifeline: 988 call 1-800-273-TALK (toll free, 24 hour hotline) call 911 if you are experiencing a Mental Health or Behavioral Health Crisis or need someone to talk to. Shona Prow RN, CCM [place]  VBCI-Population Health RN Care Manager [phone]

## 2024-05-25 NOTE — Transitions of Care (Post Inpatient/ED Visit) (Signed)
 Transition of Care week 3  Visit Note  05/25/2024  Name: Michael Robertson. MRN: [id number]          DOB: July 08, 1946  Situation: Patient enrolled in [facility] 30-day program. Visit completed with patient by telephone.   Background: Admit/Discharge Date  10/21 - 10/25 Jolynn Pack Primary Diagnosis: Generalized weakness  Initial Transition Care Management Follow-up Telephone Call Discharge Date and Diagnosis: 05/12/24, Generalized weakness   Past Medical History:  Diagnosis Date   Anemia    Anxiety    Atrial fibrillation (HCC)    Chronic kidney disease    COPD (chronic obstructive pulmonary disease) (HCC)    COPD exacerbation (HCC) 05/07/2010   Qualifier: Diagnosis of   By: Scarlet MD, Elsie         GERD (gastroesophageal reflux disease)    Hyperlipidemia    Hypertension    Insomnia    Oxygen  deficiency    Pneumonia    Pre-diabetes    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    Assessment: Patient Reported Symptoms: Cognitive Cognitive Status: No symptoms reported, Normal speech and language skills, Alert and oriented to person, place, and time      Neurological Neurological Review of Symptoms: Headaches Neurological Comment: patient reports mild headache today - checked BP - reported  99/48 and states it normally runs around 118-120 /around 65  HEENT HEENT Symptoms Reported: No symptoms reported      Cardiovascular Cardiovascular Symptoms Reported: No symptoms reported Cardiovascular Comment: Patient checked BP during call and reportsreported 99/48 and states it normally runs around 118-120 /around 65 - denies symptoms -patient drank water - BP checked again and patient reports 108/52 HR 82 - Patient states his MD is already aware that his BP runs low and states he has been lower than 99/48 and declined calling PCP office  Respiratory Respiratory Symptoms Reported: Shortness of breath Other Respiratory Symptoms: patient reports shortness of breath continues and denies any further  blood with cough - patient states he is on 2.5 liters O2 and O2 sat 85% with activity and 98% at rest - patient denies any other symptoms when O2 drops - patient states when he sits down O2 level goes back up quickly Respiratory Management Strategies: Oxygen  therapy  Endocrine Endocrine Symptoms Reported: No symptoms reported Is patient diabetic?: Yes Is patient checking blood sugars at home?: Yes List most recent blood sugar readings, include date and time of day: patient reports sugar today is 117 Endocrine Self-Management Outcome: 4 (good)  Gastrointestinal Gastrointestinal Symptoms Reported: No symptoms reported      Genitourinary Genitourinary Symptoms Reported: Other Other Genitourinary Symptoms: patient reports difficulty starting urine stream has improved a little and states it's back to where it was before I went to the hospital    Integumentary Integumentary Symptoms Reported: Other Other Integumentary Symptoms: Patient reports the hardness at his prior IV site from hospitalizaion has resolved    Musculoskeletal Musculoskelatal Symptoms Reviewed: No symptoms reported        Psychosocial Psychosocial Symptoms Reported: No symptoms reported         There were no vitals filed for this visit.  Medications Reviewed Today     Reviewed by Lauro Shona LABOR, RN (Registered Nurse) on 05/25/24 at 1217  Med List Status: <None>   Medication Order Taking? Sig Documenting Provider Last Dose Status Informant  apixaban  (ELIQUIS ) 5 MG TABS tablet [id number] Yes Take 1 tablet (5 mg total) by mouth 2 (two) times daily. Rumball, Alison M, DO  Active Self, Pharmacy Records           Med Note SANDIE, MARYLAND J   Tue May 08, 2024  9:45 PM) From [facility]  azithromycin  (ZITHROMAX ) 500 MG tablet [id number] Yes Take 1 tablet (500 mg total) by mouth every Monday, Wednesday, and Friday. Kara Dorn NOVAK, MD  Active Self, Pharmacy Records  bisoprolol  (ZEBETA ) 5 MG tablet [id number] Yes Take 4  tablets (20 mg total) by mouth daily. Cleotilde Lukes, DO  Active   Blood Glucose Monitoring Suppl DEVI [id number] Yes 1 each by Does not apply route daily as needed. May substitute to any manufacturer covered by patient's insurance. Rumball, Alison M, DO  Active Self, Pharmacy Records  cholecalciferol (VITAMIN D) 1000 units tablet [id number] Yes Take 1,000 Units by mouth daily. [provider]  Active Self, Pharmacy Records  ferrous gluconate  [facility]) 324 MG tablet [id number] Yes Take 1 tablet (324 mg total) by mouth daily. May, Deanna J, NP  Active Self, Pharmacy Records  finasteride  (PROSCAR ) 5 MG tablet [id number] Yes Take 1 tablet (5 mg total) by mouth daily. Delores Suzann HERO, MD  Active   guaiFENesin -codeine  100-10 MG/5ML syrup [id number] Yes TAKE 5 ML EVERY 6 HOURS AS NEEDED FOR COUGH Rumball, Donald HERO, DO  Active Self, Pharmacy Records  hydrochlorothiazide  (HYDRODIURIL ) 12.5 MG tablet [id number] Yes TAKE 1 TABLET DAILY Rumball, Alison M, DO  Active Self, Pharmacy Records  Lancet Device MISC [id number] Yes 1 each by Does not apply route daily as needed. May substitute to any manufacturer covered by patient's insurance. Rumball, Alison M, DO  Active Self, Pharmacy Records  levalbuterol  (XOPENEX ) 0.63 MG/3ML nebulizer solution [id number] Yes Take 3 mLs (0.63 mg total) by nebulization every 4 (four) hours as needed for wheezing or shortness of breath. Rumball, Alison M, DO  Active Self, Pharmacy Records  levofloxacin  (LEVAQUIN ) 250 MG tablet [id number]  Take 1 tablet (250 mg total) by mouth daily.  Patient not taking: Reported on 05/25/2024   Cleotilde Lukes, DO  Consider Medication Status and Discontinue (Completed Course)   losartan  (COZAAR ) 50 MG tablet [id number] Yes Take 50 mg by mouth daily. [provider]  Active Self, Pharmacy Records  mometasone  (ASMANEX ) 220 MCG/ACT inhaler [id number] Yes Inhale 2 puffs into the lungs daily. Kara Dorn NOVAK, MD  Active Self, Pharmacy Records   montelukast  (SINGULAIR ) 10 MG tablet [id number] Yes TAKE 1 TABLET AT BEDTIME Kara Dorn NOVAK, MD  Active Self, Pharmacy Records  Multiple Vitamins-Minerals (CENTRUM SILVER ADULT 50+ PO) [id number] Yes Take 1 tablet by mouth daily.  [provider]  Active Self, Pharmacy Records  omeprazole  [facility]) 40 MG capsule [id number] Yes Take 1 capsule (40 mg total) by mouth daily. Rumball, Alison M, DO  Active Self, Pharmacy Records  predniSONE  (DELTASONE ) 10 MG tablet [id number] Yes Take 1 tablet (10 mg total) by mouth daily with breakfast. Kara Dorn NOVAK, MD  Active Self, Pharmacy Records  rosuvastatin  (CRESTOR ) 20 MG tablet [id number] Yes TAKE 1 TABLET DAILY Pray, Rollene BRAVO, MD  Active Self, Pharmacy Records  tamsulosin  (FLOMAX ) 0.4 MG CAPS capsule [id number] Yes TAKE 2 CAPSULES DAILY AFTER SUPPER Rumball, Alison M, DO  Active Self, Pharmacy Records  Tiotropium Bromide -Olodaterol (STIOLTO RESPIMAT ) 2.5-2.5 MCG/ACT AERS [id number] Yes Inhale into the lungs daily. [provider]  Active   traZODone  (DESYREL ) 100 MG tablet [id number] Yes TAKE 1 TABLET AT BEDTIME Rumball, Alison M, DO  Active Self, Pharmacy Records  VENTOLIN  HFA 108 [phone] Base) MCG/ACT inhaler [id number]  USE 2 INHALATIONS  EVERY 4 HOURS AS NEEDED. RESCUE FOR SHORTNESS OF BREATH  Patient not taking: Reported on 05/25/2024   Rosalynn Camie CROME, MD  Active Self, Pharmacy Records            Recommendation:   Contact provider with any symptoms related to low BP or if BP remains low or worsens  Follow Up Plan:   Telephone follow up appointment date/time:  11/13/25am  Shona Prow RN, CCM [place]  VBCI-Population Health RN Care Manager [phone]

## 2024-05-25 NOTE — Telephone Encounter (Signed)
 Received the following message from [facility] RN:  [facility] RN placed call to patient for weekly follow up. During call patient checked BP and reported 99/48 and denied any symptoms. Patient started drinking water and rechecked BP around 10 minutes later and reported 108/52. Patient states PCP is aware his BP runs low and continues to deny symptoms and stated his personal BP cuff usually is lower than when checked at MD office. Patient was encouraged to call  with any symptoms or if BP drops further and to take his BP cuff to next provider appointment. Patient declined outreach from [facility] RN until later next week and said he is not worried about this BP  I called patient to check in on him. He reports that he has been experiencing some fatigue and cough since he was discharged from the hospital. Tmax 99.5. Denies shortness of breath, lightheadedness or dizziness. Advised patient that we would like to see him for BP follow up and to re evaluate cough. Scheduled patient for Monday AM. Discussed supportive measures and ED precautions.   Pt voices understanding.   Chiquita JAYSON English, RN

## 2024-05-28 ENCOUNTER — Ambulatory Visit (INDEPENDENT_AMBULATORY_CARE_PROVIDER_SITE_OTHER): Admitting: Family Medicine

## 2024-05-28 ENCOUNTER — Encounter: Payer: Self-pay | Admitting: Family Medicine

## 2024-05-28 VITALS — BP 133/77 | HR 86 | Wt 143.2 lb

## 2024-05-28 DIAGNOSIS — I4891 Unspecified atrial fibrillation: Secondary | ICD-10-CM

## 2024-05-28 DIAGNOSIS — J439 Emphysema, unspecified: Secondary | ICD-10-CM

## 2024-05-28 DIAGNOSIS — N1831 Chronic kidney disease, stage 3a: Secondary | ICD-10-CM

## 2024-05-28 DIAGNOSIS — I10 Essential (primary) hypertension: Secondary | ICD-10-CM

## 2024-05-28 DIAGNOSIS — J9611 Chronic respiratory failure with hypoxia: Secondary | ICD-10-CM

## 2024-05-28 MED ORDER — LEVOFLOXACIN 500 MG PO TABS: 500.0000 mg | ORAL_TABLET | Freq: Every day | ORAL | 0 refills | Status: AC

## 2024-05-28 MED ORDER — PREDNISONE 20 MG PO TABS: 40.0000 mg | ORAL_TABLET | Freq: Every day | ORAL | 0 refills | Status: AC

## 2024-05-28 NOTE — Assessment & Plan Note (Addendum)
 With hypotension at home, resolved since discontinuing losartan , no changes today however may consider adding back at lower dose for CKD and d/c hydrochlorothiazide  in the future.

## 2024-05-28 NOTE — Assessment & Plan Note (Addendum)
 Await CT chest read for f/u lung nodule. COPD exacerbation as above.

## 2024-05-28 NOTE — Assessment & Plan Note (Addendum)
 Intermittent runs of RVR at home. Rate controlled today. Instructed to call if recurs, especially if sustained. Continue current meds.

## 2024-05-28 NOTE — Patient Instructions (Addendum)
 It was great to see you!  Our plans for today:  - Take the antibiotic and steroids as prescribed. When you finish the high dose steroids, resume your 10mg  prednisone .  - Keep your appointment with Dr. Kara next week. - If you get worse and have difficulty breathing, go to the ED.  - No changes to your blood pressure medication.   Take care and seek immediate care sooner if you develop any concerns.   Dr. Delonte Musich

## 2024-05-28 NOTE — Assessment & Plan Note (Addendum)
 Reviewed last labs, improved. May consider adding back losartan  in the future for renal protection as above.

## 2024-05-28 NOTE — Progress Notes (Signed)
   SUBJECTIVE:   CHIEF COMPLAINT / HPI:   Discussed the use of AI scribe software for clinical note transcription with the patient, who gave verbal consent to proceed.  History of Present Illness Michael Robertson. is a 78 year old male with chronic lung issues who presents with worsening respiratory symptoms and hemoptysis.  Respiratory symptoms - Worsening cough and hemoptysis, with R side/lung pain  - Right lung pain developed after last CT scan 11/4 - No fever or other systemic symptoms outside of respiratory complaints - On azithromycin  three times a week and low-dose prednisone  10 mg daily for COPD - did well with previous levaquin  use at last hospitalization  Pulmonary nodule surveillance - Nodule on right lung under ongoing monitoring - Awaiting CT scan results for lung cancer screening and nodule follow-up  Low BP - SBP 90s at home - Discontinued losartan  yesterday; continues hydrochlorothiazide  with stable blood pressure  Heart rate - few episodes of elevated heart rate, 160bpm the other day.    OBJECTIVE:   BP 133/77   Pulse 86   Wt 143 lb 3.2 oz (65 kg)   SpO2 93%   BMI 19.97 kg/m   Gen: chronically ill appearing, in NAD Card: reg rate, irregular rhythm Lungs: wheezing throughout, coarse breath sounds in bilateral lung bases, R>L. Speaks in 3-4 word sentences Ext: WWP, no edema   ASSESSMENT/PLAN:   Assessment & Plan Chronic respiratory failure with hypoxia (HCC) Await CT chest read for f/u lung nodule. COPD exacerbation as above. Chronic obstructive pulmonary disease with emphysema, unspecified emphysema type (HCC) With current exacerbation. Rx levaquin , prednisone . Has f/u next week with Pulm.  HYPERTENSION, BENIGN SYSTEMIC With hypotension at home, resolved since discontinuing losartan , no changes today however may consider adding back at lower dose for CKD and d/c hydrochlorothiazide  in the future. Stage 3a chronic kidney disease (HCC) Reviewed last  labs, improved. May consider adding back losartan  in the future for renal protection as above. Atrial fibrillation with RVR (HCC) Intermittent runs of RVR at home. Rate controlled today. Instructed to call if recurs, especially if sustained. Continue current meds.     Donald CHRISTELLA Lai, DO

## 2024-05-28 NOTE — Assessment & Plan Note (Addendum)
 With current exacerbation. Rx levaquin , prednisone . Has f/u next week with Pulm.

## 2024-05-31 ENCOUNTER — Telehealth: Payer: Self-pay

## 2024-05-31 NOTE — Transitions of Care (Post Inpatient/ED Visit) (Signed)
 Transition of Care week 4  Visit Note  05/31/2024  Name: Michael Robertson. MRN: [id number]          DOB: 17-Apr-1946  Situation: Patient enrolled in [facility] 30-day program. Visit completed with patient by telephone.   Background: Admit/Discharge Date  10/21 - 10/25 Jolynn Pack Primary Diagnosis: Generalized weakness  Initial Transition Care Management Follow-up Telephone Call Discharge Date and Diagnosis: 05/12/24, Generalized weakness   Past Medical History:  Diagnosis Date   Anemia    Anxiety    Atrial fibrillation (HCC)    Chronic kidney disease    COPD (chronic obstructive pulmonary disease) (HCC)    COPD exacerbation (HCC) 05/07/2010   Qualifier: Diagnosis of   By: Scarlet MD, Elsie         GERD (gastroesophageal reflux disease)    Hyperlipidemia    Hypertension    Insomnia    Oxygen  deficiency    Pneumonia    Pre-diabetes    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    Assessment: Patient Reported Symptoms: Cognitive Cognitive Status: No symptoms reported, Normal speech and language skills, Alert and oriented to person, place, and time      Neurological Neurological Review of Symptoms: No symptoms reported    HEENT HEENT Symptoms Reported: No symptoms reported      Cardiovascular Cardiovascular Symptoms Reported: No symptoms reported Does patient have uncontrolled Hypertension?: No Cardiovascular Management Strategies: Medication therapy Weight: 143 lb (64.9 kg) Cardiovascular Self-Management Outcome: 4 (good) Cardiovascular Comment: Patient saw PCP 05/28/24 and Losartan  was stopped - Patient reports BP 125/67  Respiratory Respiratory Symptoms Reported: Shortness of breath Other Respiratory Symptoms: Patient reports a little shortness of breath today but is on new antibiotic and increased Prednison Additional Respiratory Details: Patient states,Today has been my best day so far since I got out of the hospital - has appt with pulmonary 06/06/24 Respiratory  Management Strategies: Medication therapy, Oxygen  therapy  Endocrine Endocrine Symptoms Reported: No symptoms reported Is patient diabetic?: Yes Is patient checking blood sugars at home?: Yes List most recent blood sugar readings, include date and time of day: patient reports sugar was 135  -10/31 A1C 6.9 Goal to get under 6.5 initially Endocrine Self-Management Outcome: 4 (good)  Gastrointestinal Gastrointestinal Symptoms Reported: No symptoms reported      Genitourinary Genitourinary Symptoms Reported: Difficulty initiating stream Other Genitourinary Symptoms: Patient states he continues to have issue with initating stream and reports he's had this for years - states he's been seen by urology and he feels it is still improved since hospitalization Genitourinary Management Strategies: Medication therapy Genitourinary Self-Management Outcome: 3 (uncertain)  Integumentary Integumentary Symptoms Reported: No symptoms reported    Musculoskeletal Musculoskelatal Symptoms Reviewed: No symptoms reported Additional Musculoskeletal Details: Patient states he is moving around more without need of assistive device        Psychosocial Psychosocial Symptoms Reported: No symptoms reported         There were no vitals filed for this visit. Pain Scale: 0-10 Pain Score: 0-No pain  Medications Reviewed Today     Reviewed by Lauro Shona LABOR, RN (Registered Nurse) on 05/31/24 at 1055  Med List Status: <None>   Medication Order Taking? Sig Documenting Provider Last Dose Status Informant  apixaban  (ELIQUIS ) 5 MG TABS tablet [id number] Yes Take 1 tablet (5 mg total) by mouth 2 (two) times daily. Rumball, Alison M, DO  Active Self, Pharmacy Records           Med Note Avalon, M[place]  J   Tue May 08, 2024  9:45 PM) [facility]  azithromycin  (ZITHROMAX ) 500 MG tablet [id number] Yes Take 1 tablet (500 mg total) by mouth every Monday, Wednesday, and Friday.  Patient taking differently: Take 500 mg by  mouth every Monday, Wednesday, and Friday. Patient states he is currently holding this while on Levofloxacin  - will restart after completed Levofloxacin    Kara Dorn NOVAK, MD  Active Self, Pharmacy Records  bisoprolol  (ZEBETA ) 5 MG tablet [id number] Yes Take 4 tablets (20 mg total) by mouth daily. Cleotilde Lukes, DO  Active   Blood Glucose Monitoring Suppl DEVI [id number] Yes 1 each by Does not apply route daily as needed. May substitute to any manufacturer covered by patient's insurance. Rumball, Alison M, DO  Active Self, Pharmacy Records  cholecalciferol (VITAMIN D) 1000 units tablet [id number] Yes Take 1,000 Units by mouth daily. [provider]  Active Self, Pharmacy Records  ferrous gluconate  [facility]) 324 MG tablet [id number] Yes Take 1 tablet (324 mg total) by mouth daily. May, Deanna J, NP  Active Self, Pharmacy Records  finasteride  (PROSCAR ) 5 MG tablet [id number] Yes Take 1 tablet (5 mg total) by mouth daily. Delores Suzann HERO, MD  Active   guaiFENesin -codeine  100-10 MG/5ML syrup [id number] Yes TAKE 5 ML EVERY 6 HOURS AS NEEDED FOR COUGH Madelon Donald HERO, DO  Active Self, Pharmacy Records  hydrochlorothiazide  (HYDRODIURIL ) 12.5 MG tablet [id number] Yes TAKE 1 TABLET DAILY Rumball, Alison M, DO  Active Self, Pharmacy Records  Lancet Device MISC [id number] Yes 1 each by Does not apply route daily as needed. May substitute to any manufacturer covered by patient's insurance. Rumball, Alison M, DO  Active Self, Pharmacy Records  levalbuterol  (XOPENEX ) 0.63 MG/3ML nebulizer solution [id number] Yes Take 3 mLs (0.63 mg total) by nebulization every 4 (four) hours as needed for wheezing or shortness of breath. Rumball, Alison M, DO  Active Self, Pharmacy Records  levofloxacin  (LEVAQUIN ) 500 MG tablet [id number] Yes Take 1 tablet (500 mg total) by mouth daily. Rumball, Alison M, DO  Active   mometasone  (ASMANEX ) 220 MCG/ACT inhaler [id number] Yes Inhale 2 puffs into the lungs daily. Kara Dorn NOVAK,  MD  Active Self, Pharmacy Records  montelukast  (SINGULAIR ) 10 MG tablet [id number] Yes TAKE 1 TABLET AT BEDTIME Kara Dorn NOVAK, MD  Active Self, Pharmacy Records  Multiple Vitamins-Minerals (CENTRUM SILVER ADULT 50+ PO) [id number] Yes Take 1 tablet by mouth daily.  [provider]  Active Self, Pharmacy Records  omeprazole  (PRILOSEC) 40 MG capsule [id number] Yes Take 1 capsule (40 mg total) by mouth daily. Rumball, Alison M, DO  Active Self, Pharmacy Records  predniSONE  (DELTASONE ) 10 MG tablet [id number] Yes Take 1 tablet (10 mg total) by mouth daily with breakfast. Kara Dorn NOVAK, MD  Active Self, Pharmacy Records  predniSONE  (DELTASONE ) 20 MG tablet [id number] Yes Take 2 tablets (40 mg total) by mouth daily with breakfast for 5 days. Rumball, Alison M, DO  Active   rosuvastatin  (CRESTOR ) 20 MG tablet [id number] Yes TAKE 1 TABLET DAILY Pray, Rollene BRAVO, MD  Active Self, Pharmacy Records  tamsulosin  (FLOMAX ) 0.4 MG CAPS capsule [id number] Yes TAKE 2 CAPSULES DAILY AFTER SUPPER Rumball, Alison M, DO  Active Self, Pharmacy Records  Tiotropium Bromide -Olodaterol (STIOLTO RESPIMAT ) 2.5-2.5 MCG/ACT AERS [id number] Yes Inhale into the lungs daily. [provider]  Active   traZODone  (DESYREL ) 100 MG tablet [id number] Yes TAKE 1 TABLET AT BEDTIME Rumball, Donald HERO, DO  Active Self, Pharmacy Records  VENTOLIN  HFA 108 [phone])  MCG/ACT inhaler [id number]  USE 2 INHALATIONS EVERY 4 HOURS AS NEEDED. RESCUE FOR SHORTNESS OF BREATH  Patient not taking: Reported on 05/31/2024   Rosalynn Camie CROME, MD  Active Self, Pharmacy Records            Recommendation:   Continue Current Plan of Care  Follow Up Plan:   Telephone follow up appointment date/time:  06/07/24 in the am  Shona Prow RN, CCM [place]  VBCI-Population Health RN Care Manager [phone]

## 2024-05-31 NOTE — Patient Instructions (Signed)
 Visit Information  Thank you for taking time to visit with me today. Please don't hesitate to contact me if I can be of assistance to you before our next scheduled telephone appointment.  Our next appointment is by telephone on 06/07/24 in the morning   Following is a copy of your care plan:   Goals Addressed             This Visit's Progress    VBCI Transitions of Care (TOC) Care Plan       Problems:  Recent Hospitalization for treatment of Generalized weakness/Afib/?pneumonia Patient reported coughing up trace amount of blood - update 05/17/24 patient reports this has resolved   Goal:  Over the next 30 days, the patient will not experience hospital readmission  Interventions:  Transitions of Care: Doctor Visits  - discussed the importance of doctor visits Contacted provider for patient needs Urgent hospital follow up scheduled at PCP office tomorrow 05/15/24 - Update 05/17/24 patient states he cancelled the appt and is seeing his usual PCP, Donald Lai tomorrow.  TOC RN was reviewing chart and made call 05/17/24 - patient agreed to call. Patient reports he is no longer coughing up blood and states he is currently playing [employer] on his computer and that he likes to pass time doing this. Patient reports he called Md office regarding knot on his right arm from the site where he had an IV line in the hospital and states it does not hurt and he was instructed to apply ice and states it has gone down but he will show it to his PCP tomorrow. Patient states his sugar is 147 today bp 126/65 hr 88 pulse ox is 98% - Patient feels stronger and is agreeable to ongoing TOC RN follow up  Update 05/25/24: Patient confirmed he had office visit with Dr Lai 05/18/24 with instructions to make appointments with your Pulmonologist, Cardiologist, and Gastroenterologist. Patient states he has a pulmonology appt with Dr. Kara 06/06/24 and states he had CT scan as well and that his PCP is out of office  but Dr Suzann Daring called with results of labs and he is still waiting on CT results. Patient reports BP low today (see assessment) but states his doctor is aware that BP runs low at times and patient denied any symptoms and reported improved BP after talking with TOC RN another 10 minutes. Patient encouraged to be aware of how much fluid he is taking in and patient began drinking water during the call. Patient states he will make the other suggested appts but wants to see pulm first.  Inbasket message was sent to [facility] RN Team: [facility] RN placed call to patient for weekly follow up. During call patient checked BP and reported 99/48 and denied any symptoms. Patient started drinking water and rechecked BP around 10 minutes later and reported 108/52. Patient states PCP is aware his BP runs low and continues to deny symptoms and stated his personal BP cuff usually is lower than when checked at MD office. Patient was encouraged to call  with any symptoms or if BP drops further and to take his BP cuff to next provider appointment. Patient declined outreach from [facility] RN until later next week and said he is not worried about this BP. 05/31/24 Patient states, "Today has been my best day so far since I got out of the hospital" - has appt with pulmonary 06/06/24 - Patient reports PCP appt 05/28/24 he was started on Levofloxacin  and increased Prednisone  and  states Losartan  was stopped and BP has improved and reports 125/67 sugar 135 10/31 A1C 6.9. Patient also states he has 2 weeks left of the Bisoprolol  and stated he did not mention this at his PCP appt - Patient agrees to call PCP office when we hang up to let them know he only has 2 weeks left and isn't sure if he is to continue and if so, he will need a new Rx.   AFIB Interventions:   Reviewed importance of adherence to anticoagulant exactly as prescribed Assessed social determinant of health barriers   COPD Interventions: Advised patient to track and manage COPD  triggers Assessed social determinant of health barriers Discussed the importance of adequate rest and management of fatigue with COPD Provided education about and advised patient to utilize infection prevention strategies to reduce risk of respiratory infection Provided instruction about proper use of medications used for management of COPD including inhalers Use of home oxygen   Patient Self Care Activities:  Attend all scheduled provider appointments Call pharmacy for medication refills 3-7 days in advance of running out of medications Call provider office for new concerns or questions  Notify RN Care Manager of TOC call rescheduling needs Participate in Transition of Care Program/Attend TOC scheduled calls Take medications as prescribed   identify and remove indoor air pollutants limit outdoor activity during cold weather eliminate symptom triggers at home make a plan to eat healthy take medicine as prescribed  Plan:  Telephone follow up appointment with care management team member scheduled for:  11/20am The patient has been provided with contact information for the care management team and has been advised to call with any health related questions or concerns.         Patient verbalizes understanding of instructions and care plan provided today and agrees to view in MyChart. Active MyChart status and patient understanding of how to access instructions and care plan via MyChart confirmed with patient.     Telephone follow up appointment with care management team member scheduled for:06/07/24 The patient has been provided with contact information for the care management team and has been advised to call with any health related questions or concerns.   Please call the care guide team at [phone] if you need to cancel or reschedule your appointment.   Please call the Suicide and Crisis Lifeline: 988 call 1-800-273-TALK (toll free, 24 hour hotline) call 911 if you are  experiencing a Mental Health or Behavioral Health Crisis or need someone to talk to.  Shona Prow RN, CCM [place]  VBCI-Population Health RN Care Manager [phone]

## 2024-06-06 ENCOUNTER — Ambulatory Visit: Admitting: Pulmonary Disease

## 2024-06-06 ENCOUNTER — Encounter: Payer: Self-pay | Admitting: Pulmonary Disease

## 2024-06-06 VITALS — BP 136/64 | HR 78 | Ht 69.0 in | Wt 143.4 lb

## 2024-06-06 DIAGNOSIS — J449 Chronic obstructive pulmonary disease, unspecified: Secondary | ICD-10-CM | POA: Diagnosis not present

## 2024-06-06 DIAGNOSIS — J9611 Chronic respiratory failure with hypoxia: Secondary | ICD-10-CM | POA: Diagnosis not present

## 2024-06-06 DIAGNOSIS — R911 Solitary pulmonary nodule: Secondary | ICD-10-CM | POA: Diagnosis not present

## 2024-06-06 MED ORDER — SODIUM CHLORIDE 3 % IN NEBU: INHALATION_SOLUTION | Freq: Two times a day (BID) | RESPIRATORY_TRACT | 12 refills | Status: AC | PRN

## 2024-06-06 MED ORDER — HYDROCODONE BIT-HOMATROP MBR 5-1.5 MG/5ML PO SOLN: 5.0000 mL | Freq: Four times a day (QID) | ORAL | 0 refills | Status: AC | PRN

## 2024-06-06 NOTE — Addendum Note (Signed)
 Addended by: ARMAND SOR R on: 06/06/2024 03:12 PM   Modules accepted: Orders

## 2024-06-06 NOTE — Patient Instructions (Addendum)
 Continue to use asmanex  inhaler 2 puffs at bedtime   Continue stiolto inhaler 2 puffs daily   Continue montelukast  10mg  daily for sinus congestion and your breathing   Continue azithromycin  3 days per week   Continue albuterol  inhaler as needed  Start hycodan cough syrup as needed at bedtime to help reduce cough and sleep better  Start hypertonic saline nebulizer treatments twice daily followed by flutter valve therapy for airway/mucous clearance  We will schedule you for CT Chest scan to follow up on the lung nodule and possible pneumonia changes on recent CT chest scans   Follow up in 3 months

## 2024-06-06 NOTE — Progress Notes (Signed)
 Established Patient Pulmonology Office Visit   Subjective:  Patient ID: Michael Robertson., male    DOB: 1946-01-02  MRN: [id number]  CC:  Chief Complaint  Patient presents with   Medical Management of Chronic Issues    Pt states not good , a lot is going on     Discussed the use of AI scribe software for clinical note transcription with the patient, who gave verbal consent to proceed.  History of Present Illness Michael Robertson. 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  He was hospitalized from October 22nd to October 25th for suspected pneumonia and developed atrial fibrillation with rapid ventricular response. Initial treatment included IV rocephin  and azithromycin , later expanded to vancomycin  due to persistent fever. A MRSA swab was negative, and antibiotics were deescalated to Levaquin  as cultures were negative.  During the hospital stay, he experienced a significant COPD flare-up, which he attributes to unfamiliarity with his inhalers by the medical team. He used his own inhalers from home. Since discharge, he has had an uncontrollable cough, right lung pain, and difficulty sleeping. The mucus is dark brown with occasional blood spots. He has a low-grade fever but no significant fever currently. His weight is stable at 144 pounds.  Current medications include azithromycin  three days per week, Stiolto 2 puffs daily, Asmanex  inhalers, prednisone  10 mg daily, and montelukast  daily. He uses guaifenesin  with codeine  sparingly for his cough.        Review of Systems  Constitutional:  Positive for malaise/fatigue.  Respiratory:  Positive for cough, sputum production, shortness of breath and wheezing.       Current Outpatient Medications:    apixaban  (ELIQUIS ) 5 MG TABS tablet, Take 1 tablet (5 mg total) by mouth 2 (two) times daily., Disp: 60  tablet, Rfl: 0   azithromycin  (ZITHROMAX ) 500 MG tablet, Take 1 tablet (500 mg total) by mouth every Monday, Wednesday, and Friday. (Patient taking differently: Take 500 mg by mouth every Monday, Wednesday, and Friday. Patient states he is currently holding this while on Levofloxacin  - will restart after completed Levofloxacin ), Disp: 36 tablet, Rfl: 3   bisoprolol  (ZEBETA ) 5 MG tablet, Take 4 tablets (20 mg total) by mouth daily., Disp: 120 tablet, Rfl: 0   Blood Glucose Monitoring Suppl DEVI, 1 each by Does not apply route daily as needed. May substitute to any manufacturer covered by patient's insurance., Disp: 1 each, Rfl: 0   cholecalciferol  (VITAMIN D ) 1000 units tablet, Take 1,000 Units by mouth daily., Disp: , Rfl:    ferrous gluconate  (FERGON) 324 MG tablet, Take 1 tablet (324 mg total) by mouth daily., Disp: 30 tablet, Rfl: 2   finasteride  (PROSCAR ) 5 MG tablet, Take 1 tablet (5 mg total) by mouth daily., Disp: 90 tablet, Rfl: 3   hydrochlorothiazide  (HYDRODIURIL ) 12.5 MG tablet, TAKE 1 TABLET DAILY, Disp: 90 tablet, Rfl: 3   HYDROcodone  bit-homatropine (HYCODAN) 5-1.5 MG/5ML syrup, Take 5 mLs by mouth every 6 (six) hours as needed for cough., Disp: 240 mL, Rfl: 0   Lancet Device MISC, 1 each by Does not apply route daily as needed. May substitute to any manufacturer covered by patient's insurance., Disp: 1 each, Rfl: 0   levalbuterol  (XOPENEX ) 0.63 MG/3ML nebulizer solution, Take 3 mLs (0.63 mg total) by nebulization every 4 (four) hours as needed for wheezing or shortness of breath., Disp: 3  mL, Rfl: 3   levofloxacin  (LEVAQUIN ) 500 MG tablet, Take 1 tablet (500 mg total) by mouth daily., Disp: 7 tablet, Rfl: 0   mometasone  (ASMANEX ) 220 MCG/ACT inhaler, Inhale 2 puffs into the lungs daily., Disp: 3 each, Rfl: 3   montelukast  (SINGULAIR ) 10 MG tablet, TAKE 1 TABLET AT BEDTIME, Disp: 90 tablet, Rfl: 1   Multiple Vitamins-Minerals (CENTRUM SILVER ADULT 50+ PO), Take 1 tablet by mouth daily. ,  Disp: , Rfl:    omeprazole  (PRILOSEC) 40 MG capsule, Take 1 capsule (40 mg total) by mouth daily., Disp: 90 capsule, Rfl: 3   predniSONE  (DELTASONE ) 10 MG tablet, Take 1 tablet (10 mg total) by mouth daily with breakfast., Disp: 90 tablet, Rfl: 3   rosuvastatin  (CRESTOR ) 20 MG tablet, TAKE 1 TABLET DAILY, Disp: 90 tablet, Rfl: 3   sodium chloride HYPERTONIC 3 % nebulizer solution, Take by nebulization 2 (two) times daily as needed for other., Disp: 750 mL, Rfl: 12   tamsulosin  (FLOMAX ) 0.4 MG CAPS capsule, TAKE 2 CAPSULES DAILY AFTER SUPPER, Disp: 180 capsule, Rfl: 3   Tiotropium Bromide -Olodaterol (STIOLTO RESPIMAT ) 2.5-2.5 MCG/ACT AERS, Inhale into the lungs daily., Disp: , Rfl:    traZODone  (DESYREL ) 100 MG tablet, TAKE 1 TABLET AT BEDTIME, Disp: 90 tablet, Rfl: 3   VENTOLIN  HFA 108 (90 Base) MCG/ACT inhaler, USE 2 INHALATIONS EVERY 4 HOURS AS NEEDED. RESCUE FOR SHORTNESS OF BREATH, Disp: 54 g, Rfl: 1      Objective:  BP 136/64   Pulse 78   Ht 5' 9 (1.753 m) Comment: per pt  Wt 143 lb 6.4 oz (65 kg)   SpO2 (!) 79%   BMI 21.18 kg/m     Physical Exam Constitutional:      General: He is not in acute distress.    Appearance: Normal appearance.  Eyes:     General: No scleral icterus.    Conjunctiva/sclera: Conjunctivae normal.  Cardiovascular:     Rate and Rhythm: Normal rate and regular rhythm.  Pulmonary:     Breath sounds: No wheezing, rhonchi or rales.  Musculoskeletal:     Right lower leg: No edema.     Left lower leg: No edema.  Skin:    General: Skin is warm and dry.  Neurological:     General: No focal deficit present.      Diagnostic Review:  Last CBC Lab Results  Component Value Date   WBC 19.0 (H) 05/18/2024   HGB 11.7 (L) 05/18/2024   HCT 37.5 05/18/2024   MCV 99 (H) 05/18/2024   MCH 30.9 05/18/2024   RDW 13.2 05/18/2024   PLT 591 (H) 05/18/2024   Last metabolic panel Lab Results  Component Value Date   GLUCOSE 102 (H) 05/18/2024   NA 140  05/18/2024   K 5.1 05/18/2024   CL 98 05/18/2024   CO2 25 05/18/2024   BUN 23 05/18/2024   CREATININE 1.11 05/18/2024   EGFR 68 05/18/2024   CALCIUM  9.4 05/18/2024   PROT 5.8 (L) 05/08/2024   ALBUMIN 3.2 (L) 05/08/2024   LABGLOB 2.3 11/03/2020   AGRATIO 1.9 11/03/2020   BILITOT 1.1 05/08/2024   ALKPHOS 36 (L) 05/08/2024   AST 19 05/08/2024   ALT 25 05/08/2024   ANIONGAP 10 05/11/2024   CT Chest Scan 05/22/24 1. When compared to examination dated 2022, interval increase in size and solid character of a somewhat nodular subpleural consolidation in the anterior lingula measuring 1.4 x 1.1 cm. This may reflect infection or inflammation  but is modestly concerning for malignancy. Recommend short interval follow-up in 3 months to ensure stability or resolution. 2. When compared to recent examination dated 05/08/2024, increased consolidation in the dependent right lung, consistent with acute infection or aspiration. 3. Severe, bullous emphysema. 4. Coronary artery disease.     Assessment & Plan:   Assessment & Plan Chronic obstructive pulmonary disease, unspecified COPD type (HCC)  Orders:   Respiratory or Resp and Sputum Culture; Standing   sodium chloride HYPERTONIC 3 % nebulizer solution; Take by nebulization 2 (two) times daily as needed for other.   HYDROcodone bit-homatropine (HYCODAN) 5-1.5 MG/5ML syrup; Take 5 mLs by mouth every 6 (six) hours as needed for cough.   Ambulatory Referral for DME  Chronic respiratory failure with hypoxia (HCC)  Orders:   Ambulatory Referral for DME  Lung nodule  Orders:   CT Chest Wo Contrast; Future   Ambulatory Referral for DME   Assessment and Plan Assessment & Plan Chronic hypoxic respiratory failure due to severe COPD/emphysema Severe emphysema with chronic hypoxic respiratory failure. Increased oxygen  needs and symptoms of cough, lung pain, and mucus retention. - Order placed to switch to continuous oxygen  therapy with  portable tanks and a new concentrator with a fill station. - Ordered flutter valve for mucus clearance twice daily. - Prescribed hypertonic saline nebulizer solution to loosen mucus. - Continue Stiolto and Asmanex  inhalers daily. - Prescribed Hycodan cough syrup for nighttime use to aid sleep and cough suppresant  Acute right lung infection/consolidation (suspected pneumonia) Recent hospitalization for suspected pneumonia with negative cultures. CT shows new right lung consolidation. Differential includes infection, aspiration, or resistant organisms. - Ordered sputum sample collection for culture to guide antibiotic therapy. - Continue azithromycin  three days per week. - Will consider adjusting antibiotics based on sputum culture results.  Solitary pulmonary nodule, left lung (increased in size, under surveillance) Solitary pulmonary nodule in the left lung increased in size with a hollowed-out air bubble. Differential includes infection, inflammation, or malignancy. - Will repeat CT scan in three months to monitor nodule changes.  Atrial fibrillation, recent onset, post-hospitalization Recent onset of atrial fibrillation with rapid ventricular response during hospitalization. - Continue current management and monitor for symptoms.      Return in about 3 months (around 09/06/2024) for f/u visit Dr. Kara.   Dorn KATHEE Kara, MD

## 2024-06-06 NOTE — Assessment & Plan Note (Addendum)
  Orders:   Respiratory or Resp and Sputum Culture; Standing   sodium chloride HYPERTONIC 3 % nebulizer solution; Take by nebulization 2 (two) times daily as needed for other.   HYDROcodone bit-homatropine (HYCODAN) 5-1.5 MG/5ML syrup; Take 5 mLs by mouth every 6 (six) hours as needed for cough.   Ambulatory Referral for DME

## 2024-06-06 NOTE — Assessment & Plan Note (Addendum)
  Orders:   Ambulatory Referral for DME

## 2024-06-07 ENCOUNTER — Telehealth: Payer: Self-pay

## 2024-06-07 ENCOUNTER — Other Ambulatory Visit

## 2024-06-07 DIAGNOSIS — J449 Chronic obstructive pulmonary disease, unspecified: Secondary | ICD-10-CM

## 2024-06-07 NOTE — Transitions of Care (Post Inpatient/ED Visit) (Signed)
 Transition of Care week #5  Visit Note  06/07/2024  Name: Michael Robertson. MRN: [id number]          DOB: May 22, 1946  Situation: Patient enrolled in [facility] 30-day program. Visit completed with patient by telephone.   Background: Admit/Discharge Date  10/21 - 10/25 Jolynn Pack Primary Diagnosis: Generalized weakness  Initial Transition Care Management Follow-up Telephone Call Discharge Date and Diagnosis: 05/12/24, Generalized weakness   Past Medical History:  Diagnosis Date   Anemia    Anxiety    Atrial fibrillation (HCC)    Chronic kidney disease    COPD (chronic obstructive pulmonary disease) (HCC)    COPD exacerbation (HCC) 05/07/2010   Qualifier: Diagnosis of   By: Scarlet MD, Elsie         GERD (gastroesophageal reflux disease)    Hyperlipidemia    Hypertension    Insomnia    Oxygen  deficiency    Pneumonia    Pre-diabetes    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    Assessment: Patient Reported Symptoms: Cognitive Cognitive Status: No symptoms reported, Normal speech and language skills, Alert and oriented to person, place, and time      Neurological Neurological Review of Symptoms: No symptoms reported    HEENT HEENT Symptoms Reported: No symptoms reported      Cardiovascular Cardiovascular Symptoms Reported: No symptoms reported Does patient have uncontrolled Hypertension?: No Weight: 144 lb (65.3 kg) Cardiovascular Self-Management Outcome: 4 (good)  Respiratory Respiratory Symptoms Reported: Productive cough Other Respiratory Symptoms: Patient coughed up brown sputum and his son took sample to pulmonary office today - patient saw pulmonary yesterday 06/06/24 and is waiting for son to pick up flutter valve and saline for nebulizer  - Patient reports he is on 2.5 liters oxygen  continuously Respiratory Management Strategies: Breathing exercise, Oxygen  therapy, Adequate rest, Activity Respiratory Self-Management Outcome: 3 (uncertain)  Endocrine       Gastrointestinal        Genitourinary Genitourinary Symptoms Reported: Difficulty initiating stream Other Genitourinary Symptoms: patient staets no change - hasn't improved, hasn't worsened    Integumentary Integumentary Symptoms Reported: No symptoms reported    Musculoskeletal Musculoskelatal Symptoms Reviewed: No symptoms reported        Psychosocial           Today's Vitals   06/07/24 1335  Weight: 144 lb (65.3 kg)   Pain Scale: 0-10 Pain Score: 0-No pain  Medications Reviewed Today     Reviewed by Lauro Shona LABOR, RN (Registered Nurse) on 06/07/24 at 1320  Med List Status: <None>   Medication Order Taking? Sig Documenting Provider Last Dose Status Informant  apixaban  (ELIQUIS ) 5 MG TABS tablet [id number] Yes Take 1 tablet (5 mg total) by mouth 2 (two) times daily. Rumball, Alison M, DO  Active Self, Pharmacy Records           Med Note SANDIE, MARYLAND J   Tue May 08, 2024  9:45 PM) [facility]  [facility]ith[facility]  (ZITHROMAX ) 500 MG tablet [id number] Yes Take 1 tablet (500 mg total) by mouth every Monday, Wednesday, and Friday. Kara Dorn NOVAK, MD  Active Self, Pharmacy Records  bisoprolol  (ZEBETA ) 5 MG tablet [id number] Yes Take 4 tablets (20 mg total) by mouth daily. Cleotilde Lukes, DO  Active   Blood Glucose Monitoring Suppl DEVI [id number] Yes 1 each by Does not apply route daily as needed. May substitute to any manufacturer covered by patient's insurance. Rumball, Alison M, DO  Active Self, Pharmacy Records  cholecalciferol (VITAMIN  D) 1000 units tablet [id number] Yes Take 1,000 Units by mouth daily. [provider]  Active Self, Pharmacy Records  ferrous gluconate  [facility]) 324 MG tablet [id number] Yes Take 1 tablet (324 mg total) by mouth daily. May, Deanna J, NP  Active Self, Pharmacy Records  finasteride  (PROSCAR ) 5 MG tablet [id number] Yes Take 1 tablet (5 mg total) by mouth daily. Delores Suzann HERO, MD  Active   hydrochlorothiazide  (HYDRODIURIL ) 12.5 MG  tablet [id number] Yes TAKE 1 TABLET DAILY Rumball, Alison M, DO  Active Self, Pharmacy Records  HYDROcodone bit-homatropine (HYCODAN) 5-1.5 MG/5ML syrup [id number] Yes Take 5 mLs by mouth every 6 (six) hours as needed for cough. Kara Dorn NOVAK, MD  Active   Lancet Device MISC [id number] Yes 1 each by Does not apply route daily as needed. May substitute to any manufacturer covered by patient's insurance. Rumball, Alison M, DO  Active Self, Pharmacy Records  levalbuterol  (XOPENEX ) 0.63 MG/3ML nebulizer solution [id number] Yes Take 3 mLs (0.63 mg total) by nebulization every 4 (four) hours as needed for wheezing or shortness of breath. Rumball, Alison M, DO  Active Self, Pharmacy Records  levofloxacin  (LEVAQUIN ) 500 MG tablet [id number]  Take 1 tablet (500 mg total) by mouth daily.  Patient not taking: Reported on 06/07/2024   Rumball, Alison M, DO  Consider Medication Status and Discontinue (Completed Course)   mometasone  (ASMANEX ) 220 MCG/ACT inhaler [id number] Yes Inhale 2 puffs into the lungs daily. Kara Dorn NOVAK, MD  Active Self, Pharmacy Records  montelukast  (SINGULAIR ) 10 MG tablet [id number] Yes TAKE 1 TABLET AT BEDTIME Kara Dorn NOVAK, MD  Active Self, Pharmacy Records  Multiple Vitamins-Minerals (CENTRUM SILVER ADULT 50+ PO) [id number] Yes Take 1 tablet by mouth daily.  [provider]  Active Self, Pharmacy Records  omeprazole  [facility]) 40 MG capsule [id number] Yes Take 1 capsule (40 mg total) by mouth daily. Rumball, Alison M, DO  Active Self, Pharmacy Records  predniSONE  (DELTASONE ) 10 MG tablet [id number] Yes Take 1 tablet (10 mg total) by mouth daily with breakfast. Kara Dorn NOVAK, MD  Active Self, Pharmacy Records  rosuvastatin  (CRESTOR ) 20 MG tablet [id number] Yes TAKE 1 TABLET DAILY Pray, Rollene BRAVO, MD  Active Self, Pharmacy Records  sodium chloride HYPERTONIC 3 % nebulizer solution [id number] Yes Take by nebulization 2 (two) times daily as needed for other. Kara Dorn NOVAK, MD  Active   tamsulosin  (FLOMAX ) 0.4 MG CAPS capsule [id number] Yes TAKE 2 CAPSULES DAILY AFTER SUPPER Rumball, Donald HERO, DO  Active Self, Pharmacy Records  Tiotropium Bromide -Olodaterol (STIOLTO RESPIMAT ) 2.5-2.5 MCG/ACT AERS [id number] Yes Inhale into the lungs daily. [provider]  Active   traZODone  (DESYREL ) 100 MG tablet [id number] Yes TAKE 1 TABLET AT BEDTIME Rumball, Alison M, DO  Active Self, Pharmacy Records  VENTOLIN  HFA 108 [phone] Base) MCG/ACT inhaler [id number]  USE 2 INHALATIONS EVERY 4 HOURS AS NEEDED. RESCUE FOR SHORTNESS OF BREATH  Patient not taking: Reported on 06/07/2024   Rosalynn Camie CROME, MD  Active Self, Pharmacy Records            Recommendation:   Continue Current Plan of Care  Follow Up Plan:   Telephone follow up appointment date/time:  06/13/24 in the morning  Shona Prow RN, CCM [place]  VBCI-Population Health RN Care Manager [phone]

## 2024-06-07 NOTE — Patient Instructions (Signed)
 Visit Information  Thank you for taking time to visit with me today. Please don't hesitate to contact me if I can be of assistance to you before our next scheduled telephone appointment.  Our next appointment is by telephone on 06/13/24 in the morning   Following is a copy of your care plan:   Goals Addressed             This Visit's Progress    VBCI Transitions of Care (TOC) Care Plan       Problems:  Recent Hospitalization for treatment of Generalized weakness/Afib/?pneumonia Patient reported coughing up trace amount of blood - update 05/17/24 patient reports this has resolved   Goal:  Over the next 30 days, the patient will not experience hospital readmission  Interventions:  Transitions of Care: Doctor Visits  - discussed the importance of doctor visits Contacted provider for patient needs Urgent hospital follow up scheduled at PCP office tomorrow 05/15/24 - Update 05/17/24 patient states he cancelled the appt and is seeing his usual PCP, Donald Lai tomorrow.  TOC RN was reviewing chart and made call 05/17/24 - patient agreed to call. Patient reports he is no longer coughing up blood and states he is currently playing [employer] on his computer and that he likes to pass time doing this. Patient reports he called Md office regarding knot on his right arm from the site where he had an IV line in the hospital and states it does not hurt and he was instructed to apply ice and states it has gone down but he will show it to his PCP tomorrow. Patient states his sugar is 147 today bp 126/65 hr 88 pulse ox is 98% - Patient feels stronger and is agreeable to ongoing TOC RN follow up  Update 05/25/24: Patient confirmed he had office visit with Dr Lai 05/18/24 with instructions to make appointments with your Pulmonologist, Cardiologist, and Gastroenterologist. Patient states he has a pulmonology appt with Dr. Kara 06/06/24 and states he had CT scan as well and that his PCP is out of office  but Dr Suzann Daring called with results of labs and he is still waiting on CT results. Patient reports BP low today (see assessment) but states his doctor is aware that BP runs low at times and patient denied any symptoms and reported improved BP after talking with TOC RN another 10 minutes. Patient encouraged to be aware of how much fluid he is taking in and patient began drinking water during the call. Patient states he will make the other suggested appts but wants to see pulm first.  Inbasket message was sent to [facility] RN Team: [facility] RN placed call to patient for weekly follow up. During call patient checked BP and reported 99/48 and denied any symptoms. Patient started drinking water and rechecked BP around 10 minutes later and reported 108/52. Patient states PCP is aware his BP runs low and continues to deny symptoms and stated his personal BP cuff usually is lower than when checked at MD office. Patient was encouraged to call  with any symptoms or if BP drops further and to take his BP cuff to next provider appointment. Patient declined outreach from [facility] RN until later next week and said he is not worried about this BP. 05/31/24 Patient states, "Today has been my best day so far since I got out of the hospital" - has appt with pulmonary 06/06/24 - Patient reports PCP appt 05/28/24 he was started on Levofloxacin  and increased Prednisone  and  states Losartan  was stopped and BP has improved and reports 125/67 sugar 135 10/31 A1C 6.9. Patient also states he has 2 weeks left of the Bisoprolol  and stated he did not mention this at his PCP appt - Patient agrees to call PCP office when we hang up to let them know he only has 2 weeks left and isn't sure if he is to continue and if so, he will need a new Rx.  Update 06/07/24: patient states he saw pulmonary as planned yesterday 06/06/24 and reports he is doing well and confirms oxygen  company was changed and pulmonary note states Order placed to switch to continuous  oxygen  therapy with portable tanks and a new concentrator with a fill station. - Prescribed hypertonic saline nebulizer solution to loosen mucus.  Ordered flutter valve for mucus clearance twice daily. To be done after saline via neb. - Prescribed Hycodan cough syrup for nighttime use to aid sleep and cough suppressant. Patient states he understands CT scan will be repeated in Feb and patient also sent a sputum sample to office today - awaiting results. Patient confirmed he completed Levofloxacin  and restarted the Azithromycin .   AFIB Interventions:   Reviewed importance of adherence to anticoagulant exactly as prescribed Assessed social determinant of health barriers   COPD Interventions: Advised patient to track and manage COPD triggers Assessed social determinant of health barriers Discussed the importance of adequate rest and management of fatigue with COPD Provided education about and advised patient to utilize infection prevention strategies to reduce risk of respiratory infection Provided instruction about proper use of medications used for management of COPD including inhalers Use of home oxygen   Patient Self Care Activities:  Attend all scheduled provider appointments Call pharmacy for medication refills 3-7 days in advance of running out of medications Call provider office for new concerns or questions  Notify RN Care Manager of TOC call rescheduling needs Participate in Transition of Care Program/Attend TOC scheduled calls Take medications as prescribed   identify and remove indoor air pollutants limit outdoor activity during cold weather eliminate symptom triggers at home make a plan to eat healthy take medicine as prescribed  Plan:  Telephone follow up appointment with care management team member scheduled for:  07/13/24 [place] and agrees to call - same number - discuss refer to CCM  The patient has been provided with contact information  for the care management team and has been advised to call with any health related questions or concerns.         Patient verbalizes understanding of instructions and care plan provided today and agrees to view in MyChart. Active MyChart status and patient understanding of how to access instructions and care plan via MyChart confirmed with patient.     Telephone follow up appointment with care management team member scheduled for: 06/13/24 The patient has been provided with contact information for the care management team and has been advised to call with any health related questions or concerns.   Please call the care guide team at [phone] if you need to cancel or reschedule your appointment.   Please call the Suicide and Crisis Lifeline: 988 call 1-800-273-TALK (toll free, 24 hour hotline) call 911 if you are experiencing a Mental Health or Behavioral Health Crisis or need someone to talk to.  Shona Prow RN, CCM [place]  VBCI-Population Health RN Care Manager [phone]

## 2024-06-08 ENCOUNTER — Other Ambulatory Visit: Payer: Self-pay | Admitting: Family Medicine

## 2024-06-08 ENCOUNTER — Other Ambulatory Visit (HOSPITAL_COMMUNITY): Payer: Self-pay

## 2024-06-08 LAB — RESPIRATORY CULTURE OR RESPIRATORY AND SPUTUM CULTURE: MICRO NUMBER:: 17262298

## 2024-06-08 MED ORDER — BISOPROLOL FUMARATE 5 MG PO TABS
20.0000 mg | ORAL_TABLET | Freq: Every day | ORAL | 0 refills | Status: AC
  Filled 2024-06-08: qty 120, 30d supply, fill #0

## 2024-06-13 ENCOUNTER — Telehealth: Payer: Self-pay

## 2024-06-13 NOTE — Transitions of Care (Post Inpatient/ED Visit) (Signed)
 Transition of Care Week #5  Visit Note  06/13/2024  Name: Michael Robertson. MRN: [id number]          DOB: 03-13-1946  Situation: Patient enrolled in [facility] 30-day program. Visit completed with patient by telephone.   Background: Admit/Discharge Date  10/21 - 10/25 Jolynn Pack Primary Diagnosis: Generalized weakness  Initial Transition Care Management Follow-up Telephone Call Discharge Date and Diagnosis: 05/12/24, Generalized weakness   Past Medical History:  Diagnosis Date   Anemia    Anxiety    Atrial fibrillation (HCC)    Chronic kidney disease    COPD (chronic obstructive pulmonary disease) (HCC)    COPD exacerbation (HCC) 05/07/2010   Qualifier: Diagnosis of   By: Scarlet MD, Elsie         GERD (gastroesophageal reflux disease)    Hyperlipidemia    Hypertension    Insomnia    Oxygen  deficiency    Pneumonia    Pre-diabetes    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    Assessment: Patient Reported Symptoms: Cognitive Cognitive Status: No symptoms reported, Normal speech and language skills, Alert and oriented to person, place, and time      Neurological Neurological Review of Symptoms: No symptoms reported    HEENT HEENT Symptoms Reported: No symptoms reported      Cardiovascular Cardiovascular Symptoms Reported: No symptoms reported    Respiratory Respiratory Symptoms Reported: Productive cough, Shortness of breath Other Respiratory Symptoms: Patient states he still coughing up brown sputum, states [employer] did not have the flutter valve and states he was told the sputum specimen he provided was not enough and he does not have another specimen cup. TOC RN sent message to pulm, Dr Luann CMA to advise Additional Respiratory Details: Educated on breathing exercises including pursed lip breathing and breath-holding exercises Respiratory Management Strategies: Oxygen  therapy, Activity, Adequate rest, Breathing exercise Respiratory Self-Management Outcome: 3 (uncertain)   Endocrine Endocrine Symptoms Reported: No symptoms reported Is patient diabetic?: Yes Is patient checking blood sugars at home?: Yes List most recent blood sugar readings, include date and time of day: patient reports sugar 137 Endocrine Self-Management Outcome: 4 (good)  Gastrointestinal Gastrointestinal Symptoms Reported: No symptoms reported      Genitourinary Genitourinary Symptoms Reported: Difficulty initiating stream    Integumentary Integumentary Symptoms Reported: No symptoms reported    Musculoskeletal Musculoskelatal Symptoms Reviewed: No symptoms reported        Psychosocial Psychosocial Symptoms Reported: No symptoms reported         There were no vitals filed for this visit. Pain Scale: 0-10 Pain Score: 0-No pain  Medications Reviewed Today     Reviewed by Lauro Shona LABOR, RN (Registered Nurse) on 06/13/24 at 1137  Med List Status: <None>   Medication Order Taking? Sig Documenting Provider Last Dose Status Informant  apixaban  (ELIQUIS ) 5 MG TABS tablet [id number] Yes Take 1 tablet (5 mg total) by mouth 2 (two) times daily. Rumball, Alison M, DO  Active Self, Pharmacy Records           Med Note SANDIE, MARYLAND J   Tue May 08, 2024  9:45 PM) [facility]  azithromycin  (ZITHROMAX ) 500 MG tablet [id number] Yes Take 1 tablet (500 mg total) by mouth every Monday, Wednesday, and Friday. Kara Dorn NOVAK, MD  Active Self, Pharmacy Records  bisoprolol  (ZEBETA ) 5 MG tablet [id number] Yes Take 4 tablets (20 mg total) by mouth daily. Rumball, Alison M, DO  Active   Blood Glucose Monitoring Suppl DEVI [id number]  Yes 1 each by Does not apply route daily as needed. May substitute to any manufacturer covered by patient's insurance. Rumball, Alison M, DO  Active Self, Pharmacy Records  cholecalciferol  (VITAMIN D ) 1000 units tablet [id number] Yes Take 1,000 Units by mouth daily. [provider]  Active Self, Pharmacy Records  ferrous gluconate  [facility]) 324 MG tablet  [id number] Yes Take 1 tablet (324 mg total) by mouth daily. May, Deanna J, NP  Active Self, Pharmacy Records  finasteride  (PROSCAR ) 5 MG tablet [id number] Yes Take 1 tablet (5 mg total) by mouth daily. Delores Suzann HERO, MD  Active   hydrochlorothiazide  (HYDRODIURIL ) 12.5 MG tablet [id number] Yes TAKE 1 TABLET DAILY Rumball, Donald HERO, DO  Active Self, Pharmacy Records  HYDROcodone  bit-homatropine [facility]) 5-1.5 MG/5ML syrup [id number] Yes Take 5 mLs by mouth every 6 (six) hours as needed for cough. Kara Dorn NOVAK, MD  Active   Lancet Device MISC [id number] Yes 1 each by Does not apply route daily as needed. May substitute to any manufacturer covered by patient's insurance. Rumball, Alison M, DO  Active Self, Pharmacy Records  levalbuterol  (XOPENEX ) 0.63 MG/3ML nebulizer solution [id number] Yes Take 3 mLs (0.63 mg total) by nebulization every 4 (four) hours as needed for wheezing or shortness of breath. Rumball, Alison M, DO  Active Self, Pharmacy Records  levofloxacin  (LEVAQUIN ) 500 MG tablet [id number]  Take 1 tablet (500 mg total) by mouth daily.  Patient not taking: Reported on 06/07/2024   Rumball, Alison M, DO  Consider Medication Status and Discontinue (Completed Course)   mometasone  (ASMANEX ) 220 MCG/ACT inhaler [id number] Yes Inhale 2 puffs into the lungs daily. Kara Dorn NOVAK, MD  Active Self, Pharmacy Records  montelukast  (SINGULAIR ) 10 MG tablet [id number] Yes TAKE 1 TABLET AT BEDTIME Kara Dorn NOVAK, MD  Active Self, Pharmacy Records  Multiple Vitamins-Minerals (CENTRUM SILVER ADULT 50+ PO) [id number] Yes Take 1 tablet by mouth daily.  [provider]  Active Self, Pharmacy Records  omeprazole  (PRILOSEC) 40 MG capsule [id number] Yes Take 1 capsule (40 mg total) by mouth daily. Rumball, Alison M, DO  Active Self, Pharmacy Records  predniSONE  (DELTASONE ) 10 MG tablet [id number] Yes Take 1 tablet (10 mg total) by mouth daily with breakfast. Kara Dorn NOVAK, MD  Active Self, Pharmacy  Records  rosuvastatin  (CRESTOR ) 20 MG tablet [id number] Yes TAKE 1 TABLET DAILY Pray, Margaret E, MD  Active Self, Pharmacy Records  sodium chloride  HYPERTONIC 3 % nebulizer solution [id number] Yes Take by nebulization 2 (two) times daily as needed for other. Kara Dorn NOVAK, MD  Active   tamsulosin  (FLOMAX ) 0.4 MG CAPS capsule [id number] Yes TAKE 2 CAPSULES DAILY AFTER SUPPER Rumball, Donald HERO, DO  Active Self, Pharmacy Records  Tiotropium Bromide -Olodaterol (STIOLTO RESPIMAT ) 2.5-2.5 MCG/ACT AERS [id number] Yes Inhale into the lungs daily. [provider]  Active   traZODone  (DESYREL ) 100 MG tablet [id number] Yes TAKE 1 TABLET AT BEDTIME Rumball, Alison M, DO  Active Self, Pharmacy Records  VENTOLIN  HFA 108 [phone] Base) MCG/ACT inhaler [id number]  USE 2 INHALATIONS EVERY 4 HOURS AS NEEDED. RESCUE FOR SHORTNESS OF BREATH  Patient not taking: Reported on 06/13/2024   Rosalynn Camie CROME, MD  Active Self, Pharmacy Records            Recommendation:   Continue Current Plan of Care  Follow Up Plan:   Telephone follow up appointment date/time:  06/20/24 in the morning  Shona Prow RN, CCM [place]  VBCI-Population Health RN Care Manager [phone]

## 2024-06-13 NOTE — Patient Instructions (Signed)
 Visit Information  Thank you for taking time to visit with me today. Please don't hesitate to contact me if I can be of assistance to you before our next scheduled telephone appointment.  Our next appointment is by telephone on 06/20/24 in the morning  Following is a copy of your care plan:   Goals Addressed             This Visit's Progress    VBCI Transitions of Care (TOC) Care Plan       Problems:  Recent Hospitalization for treatment of Generalized weakness/Afib/?pneumonia Patient reported coughing up trace amount of blood - update 05/17/24 patient reports this has resolved   Goal:  Over the next 30 days, the patient will not experience hospital readmission  Interventions:  Transitions of Care: Doctor Visits  - discussed the importance of doctor visits Contacted provider for patient needs Urgent hospital follow up scheduled at PCP office tomorrow 05/15/24 - Update 05/17/24 patient states he cancelled the appt and is seeing his usual PCP, Donald Lai tomorrow.  TOC RN was reviewing chart and made call 05/17/24 - patient agreed to call. Patient reports he is no longer coughing up blood and states he is currently playing [employer] on his computer and that he likes to pass time doing this. Patient reports he called Md office regarding knot on his right arm from the site where he had an IV line in the hospital and states it does not hurt and he was instructed to apply ice and states it has gone down but he will show it to his PCP tomorrow. Patient states his sugar is 147 today bp 126/65 hr 88 pulse ox is 98% - Patient feels stronger and is agreeable to ongoing TOC RN follow up  Update 05/25/24: Patient confirmed he had office visit with Dr Lai 05/18/24 with instructions to make appointments with your Pulmonologist, Cardiologist, and Gastroenterologist. Patient states he has a pulmonology appt with Dr. Kara 06/06/24 and states he had CT scan as well and that his PCP is out of office  but Dr Suzann Daring called with results of labs and he is still waiting on CT results. Patient reports BP low today (see assessment) but states his doctor is aware that BP runs low at times and patient denied any symptoms and reported improved BP after talking with TOC RN another 10 minutes. Patient encouraged to be aware of how much fluid he is taking in and patient began drinking water during the call. Patient states he will make the other suggested appts but wants to see pulm first.  Inbasket message was sent to [facility] RN Team: [facility] RN placed call to patient for weekly follow up. During call patient checked BP and reported 99/48 and denied any symptoms. Patient started drinking water and rechecked BP around 10 minutes later and reported 108/52. Patient states PCP is aware his BP runs low and continues to deny symptoms and stated his personal BP cuff usually is lower than when checked at MD office. Patient was encouraged to call  with any symptoms or if BP drops further and to take his BP cuff to next provider appointment. Patient declined outreach from [facility] RN until later next week and said he is not worried about this BP. 05/31/24 Patient states, "Today has been my best day so far since I got out of the hospital" - has appt with pulmonary 06/06/24 - Patient reports PCP appt 05/28/24 he was started on Levofloxacin  and increased Prednisone  and states  Losartan  was stopped and BP has improved and reports 125/67 sugar 135 10/31 A1C 6.9. Patient also states he has 2 weeks left of the Bisoprolol  and stated he did not mention this at his PCP appt - Patient agrees to call PCP office when we hang up to let them know he only has 2 weeks left and isn't sure if he is to continue and if so, he will need a new Rx.  Update 06/07/24: patient states he saw pulmonary as planned yesterday 06/06/24 and reports he is doing well and confirms oxygen  company was changed and pulmonary note states Order placed to switch to continuous  oxygen  therapy with portable tanks and a new concentrator with a fill station. - Prescribed hypertonic saline nebulizer solution to loosen mucus.  Ordered flutter valve for mucus clearance twice daily. To be done after saline via neb. - Prescribed Hycodan cough syrup for nighttime use to aid sleep and cough suppressant. Patient states he understands CT scan will be repeated in Feb and patient also sent a sputum sample to office today - awaiting results. Patient confirmed he completed Levofloxacin  and restarted the Azithromycin .  Update 06/13/24: Patient states he continues to feel better - states he did not go to the beach with his family - his choice - states he was told the sputum sample he provided was not enough and states he did not get the flutter valve - saying [employer] did not have it. TOC RN sent secure chat message to Dr Luann CMA, Burnard JONELLE Cao - [place]  as follows: Good morning - I'm TOC RN on phone with patient who states he did not get the flutter valve and states he was told sputum collected was not enough and he needs a new specimen cup - still coughing up brown sputum - can you assist? Response from CMA as follows: flutter valve was sent to pharmacy - we have since received some. I can leave a flutter valve and new cup at the front desk  Patient was made aware and said either he will go today or his son will go Monday as the office is closed Thursday and Friday for the holiday. TOC RN educated on coughing, deep breathing/pursed lip breathing, etc - Patient states he does feel good today - Patient asked for additional call to follow up regarding valve and sputum then transfer to CCM.   AFIB Interventions:   Reviewed importance of adherence to anticoagulant exactly as prescribed Assessed social determinant of health barriers   COPD Interventions: Advised patient to track and manage COPD triggers Assessed social determinant of health barriers Discussed the importance of adequate  rest and management of fatigue with COPD Provided education about and advised patient to utilize infection prevention strategies to reduce risk of respiratory infection Provided instruction about proper use of medications used for management of COPD including inhalers Use of home oxygen   Patient Self Care Activities:  Attend all scheduled provider appointments Call pharmacy for medication refills 3-7 days in advance of running out of medications Call provider office for new concerns or questions  Notify RN Care Manager of TOC call rescheduling needs Participate in Transition of Care Program/Attend TOC scheduled calls Take medications as prescribed   identify and remove indoor air pollutants limit outdoor activity during cold weather eliminate symptom triggers at home make a plan to eat healthy take medicine as prescribed  Plan: [facility] RN will make an additional call to patient to see if he has received flutter valve and provided  sputum  Telephone follow up appointment with care management team member scheduled for:  06/20/24 in the morning - discuss refer to CCM  The patient has been provided with contact information for the care management team and has been advised to call with any health related questions or concerns.         Patient verbalizes understanding of instructions and care plan provided today and agrees to view in MyChart. Active MyChart status and patient understanding of how to access instructions and care plan via MyChart confirmed with patient.     Telephone follow up appointment with care management team member scheduled for: 06/20/24 The patient has been provided with contact information for the care management team and has been advised to call with any health related questions or concerns.   Please call the care guide team at [phone] if you need to cancel or reschedule your appointment.   Please call the Suicide and Crisis Lifeline: 988 call 1-800-273-TALK (toll  free, 24 hour hotline) call 911 if you are experiencing a Mental Health or Behavioral Health Crisis or need someone to talk to.  Shona Prow RN, CCM [place]  VBCI-Population Health RN Care Manager [phone]

## 2024-06-13 NOTE — Telephone Encounter (Signed)
 Flutter valve and new sputum cup at front desk for pickup  ( family member will be picking up )

## 2024-06-19 ENCOUNTER — Other Ambulatory Visit

## 2024-06-19 ENCOUNTER — Other Ambulatory Visit: Payer: Self-pay | Admitting: *Deleted

## 2024-06-19 DIAGNOSIS — J449 Chronic obstructive pulmonary disease, unspecified: Secondary | ICD-10-CM

## 2024-06-19 DIAGNOSIS — J9611 Chronic respiratory failure with hypoxia: Secondary | ICD-10-CM

## 2024-06-20 ENCOUNTER — Telehealth: Payer: Self-pay

## 2024-06-20 DIAGNOSIS — J441 Chronic obstructive pulmonary disease with (acute) exacerbation: Secondary | ICD-10-CM

## 2024-06-20 LAB — RESPIRATORY CULTURE OR RESPIRATORY AND SPUTUM CULTURE: MICRO NUMBER:: 17303964

## 2024-06-20 NOTE — Transitions of Care (Post Inpatient/ED Visit) (Signed)
 Transition of Care TOC closure Note  Visit Note  06/20/2024  Name: Michael Robertson. MRN: [id number]          DOB: [birth year]  Situation: Patient enrolled in [facility] 30-day program. Visit completed with patient by telephone.   Background: Admit/Discharge Date  10/21 - 10/25 Jolynn Pack Primary Diagnosis: Generalized weakness  Initial Transition Care Management Follow-up Telephone Call Discharge Date and Diagnosis: No data recorded   Past Medical History:  Diagnosis Date   Anemia    Anxiety    Atrial fibrillation (HCC)    Chronic kidney disease    COPD (chronic obstructive pulmonary disease) (HCC)    COPD exacerbation (HCC) 05/07/2010   Qualifier: Diagnosis of   By: Scarlet MD, Elsie         GERD (gastroesophageal reflux disease)    Hyperlipidemia    Hypertension    Insomnia    Oxygen  deficiency    Pneumonia    Pre-diabetes    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    Assessment: Patient Reported Symptoms: Cognitive Cognitive Status: No symptoms reported, Normal speech and language skills, Alert and oriented to person, place, and time      Neurological Neurological Review of Symptoms: No symptoms reported    HEENT HEENT Symptoms Reported: No symptoms reported      Cardiovascular Cardiovascular Symptoms Reported: No symptoms reported    Respiratory Respiratory Symptoms Reported: No symptoms reported Other Respiratory Symptoms: Patient denies shortness of breath today - states O2 sat is 98% - 2.5 liters oxygen  Additional Respiratory Details: Patient states he has been doing breathing exercises as previously discussed and is moving around more Respiratory Management Strategies: Oxygen  therapy, Medication therapy, Routine screening  Endocrine Endocrine Symptoms Reported: No symptoms reported Is patient diabetic?: Yes Is patient checking blood sugars at home?: Yes List most recent blood sugar readings, include date and time of day: Patient reports sugar today of  123 Endocrine Self-Management Outcome: 4 (good)  Gastrointestinal Gastrointestinal Symptoms Reported: No symptoms reported      Genitourinary Genitourinary Symptoms Reported: Difficulty initiating stream    Integumentary Integumentary Symptoms Reported: No symptoms reported    Musculoskeletal Musculoskelatal Symptoms Reviewed: No symptoms reported        Psychosocial Psychosocial Symptoms Reported: No symptoms reported         Today's Vitals   06/20/24 1137  BP: 127/65  SpO2: 98%   Pain Scale: 0-10 Pain Score: 0-No pain  Medications Reviewed Today     Reviewed by Lauro Shona LABOR, RN (Registered Nurse) on 06/20/24 at 1130  Med List Status: <None>   Medication Order Taking? Sig Documenting Provider Last Dose Status Informant  apixaban  (ELIQUIS ) 5 MG TABS tablet [id number] Yes Take 1 tablet (5 mg total) by mouth 2 (two) times daily. Rumball, Alison M, DO  Active Self, Pharmacy Records           Med Note SANDIE, MARYLAND J   Tue May 08, 2024  9:45 PM) From [facility]  azithromycin  (ZITHROMAX ) 500 MG tablet [id number] Yes Take 1 tablet (500 mg total) by mouth every Monday, Wednesday, and Friday. Kara Dorn NOVAK, MD  Active Self, Pharmacy Records  bisoprolol  (ZEBETA ) 5 MG tablet [id number] Yes Take 4 tablets (20 mg total) by mouth daily. Rumball, Alison M, DO  Active   Blood Glucose Monitoring Suppl DEVI [id number] Yes 1 each by Does not apply route daily as needed. May substitute to any manufacturer covered by patient's insurance. Rumball, Alison M, DO  Active  Self, Pharmacy Records  cholecalciferol  (VITAMIN D ) 1000 units tablet [id number] Yes Take 1,000 Units by mouth daily. [provider]  Active Self, Pharmacy Records  ferrous gluconate  [facility]) 324 MG tablet [id number] Yes Take 1 tablet (324 mg total) by mouth daily. May, Deanna J, NP  Active Self, Pharmacy Records  finasteride  (PROSCAR ) 5 MG tablet [id number] Yes Take 1 tablet (5 mg total) by mouth daily. Delores Suzann HERO,  MD  Active   hydrochlorothiazide  (HYDRODIURIL ) 12.5 MG tablet [id number] Yes TAKE 1 TABLET DAILY Rumball, Alison M, DO  Active Self, Pharmacy Records  HYDROcodone  bit-homatropine [facility]) 5-1.5 MG/5ML syrup [id number] Yes Take 5 mLs by mouth every 6 (six) hours as needed for cough. Kara Dorn NOVAK, MD  Active   Lancet Device MISC [id number] Yes 1 each by Does not apply route daily as needed. May substitute to any manufacturer covered by patient's insurance. Rumball, Alison M, DO  Active Self, Pharmacy Records  levalbuterol  (XOPENEX ) 0.63 MG/3ML nebulizer solution [id number] Yes Take 3 mLs (0.63 mg total) by nebulization every 4 (four) hours as needed for wheezing or shortness of breath. Rumball, Alison M, DO  Active Self, Pharmacy Records  levofloxacin  (LEVAQUIN ) 500 MG tablet [id number]  Take 1 tablet (500 mg total) by mouth daily.  Patient not taking: Reported on 06/07/2024   Rumball, Alison M, DO  Consider Medication Status and Discontinue (Completed Course)   mometasone  (ASMANEX ) 220 MCG/ACT inhaler [id number] Yes Inhale 2 puffs into the lungs daily. Kara Dorn NOVAK, MD  Active Self, Pharmacy Records  montelukast  (SINGULAIR ) 10 MG tablet [id number] Yes TAKE 1 TABLET AT BEDTIME Kara Dorn NOVAK, MD  Active Self, Pharmacy Records  Multiple Vitamins-Minerals (CENTRUM SILVER ADULT 50+ PO) [id number] Yes Take 1 tablet by mouth daily.  [provider]  Active Self, Pharmacy Records  omeprazole  (PRILOSEC) 40 MG capsule [id number] Yes Take 1 capsule (40 mg total) by mouth daily. Rumball, Alison M, DO  Active Self, Pharmacy Records  predniSONE  (DELTASONE ) 10 MG tablet [id number] Yes Take 1 tablet (10 mg total) by mouth daily with breakfast. Kara Dorn NOVAK, MD  Active Self, Pharmacy Records  rosuvastatin  (CRESTOR ) 20 MG tablet [id number] Yes TAKE 1 TABLET DAILY Donzetta Rollene BRAVO, MD  Active Self, Pharmacy Records  sodium chloride  HYPERTONIC 3 % nebulizer solution [id number] Yes Take by nebulization  2 (two) times daily as needed for other. Kara Dorn NOVAK, MD  Active   tamsulosin  (FLOMAX ) 0.4 MG CAPS capsule [id number] Yes TAKE 2 CAPSULES DAILY AFTER SUPPER Rumball, Donald HERO, DO  Active Self, Pharmacy Records  Tiotropium Bromide -Olodaterol (STIOLTO RESPIMAT ) 2.5-2.5 MCG/ACT AERS [id number] Yes Inhale into the lungs daily. [provider]  Active   traZODone  (DESYREL ) 100 MG tablet [id number] Yes TAKE 1 TABLET AT BEDTIME Rumball, Alison M, DO  Active Self, Pharmacy Records  VENTOLIN  HFA 108 [phone] Base) MCG/ACT inhaler [id number]  USE 2 INHALATIONS EVERY 4 HOURS AS NEEDED. RESCUE FOR SHORTNESS OF BREATH  Patient not taking: Reported on 06/13/2024   Rosalynn Camie CROME, MD  Consider Medication Status and Discontinue (Change in therapy) Self, Pharmacy Records            Recommendation:   Referral to: CCM  Follow Up Plan:   Closing From:  Transitions of Care Program  Shona Prow RN, CCM [facility]  VBCI-Population Health RN Care Manager [phone]

## 2024-06-22 ENCOUNTER — Ambulatory Visit: Admitting: Pulmonary Disease

## 2024-07-02 ENCOUNTER — Telehealth: Admitting: *Deleted

## 2024-07-23 ENCOUNTER — Telehealth: Payer: Self-pay | Admitting: *Deleted

## 2024-07-23 ENCOUNTER — Encounter: Payer: Self-pay | Admitting: *Deleted

## 2024-07-23 NOTE — Patient Outreach (Signed)
 Complex Care Management   Visit Note  07/23/2024  Name:  Michael Robertson. MRN: [id number] DOB: 1946-06-13  Situation: Referral received from Care Management services with the initial scheduled for today. RNCM  completed an outreach call today and spoke with the daughter [name] who indicated she was the POA-not noted on the [facility] via EPIC at this time.   Daughter has indicated pt has been admitted into a hospital in [facility] and currently remains in the ICU. RNCM informed the daughter VBCI services will follow up at a later time to reschedule (receptive).   RNCM will alert care-guide to reschedule this initial a few weeks out for VBCI services. No other inquires or request at this time.  Olam Ku, RN, BSN [place]  [facility], [facility] RN Care Manager Direct Dial: [phone]  Fax: [phone]

## 2024-07-31 ENCOUNTER — Telehealth: Payer: Self-pay | Admitting: *Deleted

## 2024-07-31 ENCOUNTER — Encounter: Payer: Self-pay | Admitting: Family Medicine

## 2024-07-31 DIAGNOSIS — Z515 Encounter for palliative care: Secondary | ICD-10-CM

## 2024-07-31 DIAGNOSIS — J439 Emphysema, unspecified: Secondary | ICD-10-CM

## 2024-07-31 NOTE — Progress Notes (Unsigned)
 Complex Care Management Care Guide Note  07/31/2024 Name: Michael Robertson. MRN: [id number] DOB: 1945/11/16  Michael Robertson. is a 79 y.o. year old male who is a primary care patient of Rumball, Alison M, DO and is actively engaged with the care management team. I reached out to Michael Robertson. by phone today to assist with re-scheduling  with the RN Case Manager.  Follow up plan: Unsuccessful telephone outreach attempt made. A HIPAA compliant phone message was left for the patient providing contact information and requesting a return call.  Harlene Satterfield  [facility]  Value-Based Care Institute, [facility] Guide  Direct Dial: [phone]  Fax [phone]

## 2024-08-02 NOTE — Progress Notes (Addendum)
 MDR completed with Dr. Tobie and team. Patient to transfer out of the ICU to med surg.   Cm reached out to [facility] in [place], [phone] or [phone]. Cm LVMM to see if they can still accept patient. PT/OT pending evaluation at this time.   Patient has transportation services that will transport him at discharge to the facility.  Medical transport: [phone] Case ID: [id number]   Patient transport sheet is in the patients paper chart. The form is partially complete, will need to be completed closer to discharge then faxed into the transport company. Cm partially completed, pending PT updated evaluation and will need provider signature.

## 2024-08-02 NOTE — Progress Notes (Signed)
 Upon Chart Review   On 07/31/24, a MyChart message was received from the patients daughter, Velna Monte, addressed to Dr. Rumball (PCP). She reported that the patient has been admitted to the ICU at [facility] in [place], [place] since 07/15/24. Plan: No further outreach attempts will be made at this time. Will await a new referral after discharge if further assistance is needed.     Harlene Satterfield  [facility]  Value-Based Care Institute, [facility] Guide  Direct Dial: [phone]  Fax [phone]

## 2024-08-10 NOTE — Nursing Note (Signed)
 Bedside shift handoff received at the change of shift from [employer].  Patient noted in bed alert and verbally responsive.  Plan of care discussed with patient and ongoing nurse.  Plan for discharge tomorrow morning; patient will be picked up at 0945.  Safety measures in place at this time and call bell within reach.

## 2024-08-10 NOTE — Care Plan (Signed)
" °  Problem: Pain - Adult Goal: Verbalizes/displays adequate comfort level or baseline comfort level Outcome: Progressing   Problem: Safety - Adult Goal: Free from fall injury Outcome: Progressing   Problem: Discharge Planning Goal: Discharge to home or other facility with appropriate resources Outcome: Progressing   Problem: Altered Nutrient Intake Goal: Nutrient intake appropriate for improving, restoring or maintaining nutritional needs Outcome: Progressing Goal: No signs or symptoms of fluid overload or dehydration.  Electrolytes WDL. Outcome: Progressing   Problem: Chronic Conditions and Co-morbidities Goal: Patient's chronic conditions and co-morbidity symptoms are monitored and maintained or improved Outcome: Progressing   Problem: Infection related to problem list condition Goal: Infection will resolve through treatment Outcome: Progressing   Problem: Resident experiences pain/discomfort Goal: I will maintain an acceptable level of pain Outcome: Progressing   Problem: Risk for falls Goal: I will remain free from falls Outcome: Progressing   Problem: Respiratory distress related to problem list condition Goal: I will be free of signs and symptoms of respiratory distress as evidenced by lungs clear to auscultation bilateral, respirations even and nonlabored Outcome: Progressing   Problem: Neurosensory - Adult Goal: Achieves stable or improved neurological status Outcome: Progressing Goal: Absence of seizures Outcome: Progressing Goal: Remains free of injury related to seizures activity Outcome: Progressing Goal: Achieves maximal functionality and self care Outcome: Progressing   Problem: Respiratory - Adult Goal: Achieves optimal ventilation and oxygenation Outcome: Progressing   Problem: Cardiovascular - Adult Goal: Maintains optimal cardiac output and hemodynamic stability Outcome: Progressing Goal: Absence of cardiac dysrhythmias or at baseline Outcome:  Progressing   Problem: Musculoskeletal - Adult Goal: Return mobility to safest level of function Outcome: Progressing Goal: Maintain proper alignment of affected body part Outcome: Progressing Goal: Return ADL status to a safe level of function Outcome: Progressing   Problem: Genitourinary - Adult Goal: Absence of urinary retention Outcome: Progressing   Problem: Infection - Adult Goal: Absence of infection at discharge Outcome: Progressing Goal: Absence of infection during hospitalization Outcome: Progressing Goal: Absence of fever/infection during anticipated neutropenic period Outcome: Progressing   Problem: Metabolic/Fluid and Electrolytes - Adult Goal: Electrolytes maintained within normal limits Outcome: Progressing Goal: Hemodynamic stability and optimal renal function maintained Outcome: Progressing Goal: Glucose maintained within prescribed range Outcome: Progressing   Problem: Resident has compromised skin integrity Goal: I will maintain or improve skin integrity Outcome: Progressing   Problem: Skin/Tissue Integrity - Adult Goal: Skin integrity remains intact Outcome: Progressing Goal: Incisions, wounds, or drain sites healing without S/S of infection Outcome: Progressing Goal: Oral mucous membranes remain intact Outcome: Progressing   Problem: Gastrointestinal - Adult Goal: Minimal or absence of nausea and vomiting Outcome: Progressing Goal: Maintains or returns to baseline bowel function Outcome: Progressing Goal: Maintains adequate nutritional intake Outcome: Progressing   Problem: Hematologic - Adult Goal: Maintains hematologic stability Outcome: Progressing  The patient's goals for the shift include safety, comfort  The clinical goals for the shift include vss, labs   "

## 2024-08-10 NOTE — Progress Notes (Signed)
 Patient: Michael Robertson  Date: 08/10/2024 Time: 2:22 PM  Case Manager Notes CM notified [employer] spoke ot [name] [place] contact info [phone] to call her w/transport time of arrival to transport to rehab in [place].   Signed by: Almarie Bihari, RN

## 2024-08-10 NOTE — Discharge Summary (Addendum)
 [facility] Discharge Summary  Admission date:  07/15/2024  Discharge date:  08/11/2024  Discharge Diagnosis Viral pneumonia Principal Problem:   Viral pneumonia (POA: Yes) Active Problems:   Benign prostatic hyperplasia without urinary obstruction (POA: Yes)   Chronic obstructive pulmonary disease (POA: Yes)   Hyperlipidemia (POA: Yes)   Hypertension (POA: Yes)   Type 2 diabetes mellitus (POA: Yes)   Atrial fibrillation with rapid ventricular response (HCC) (POA: Unknown)   Acute on chronic hypoxic respiratory failure (HCC) (POA: Unknown)   COPD exacerbation (HCC) (POA: Yes)   Severe protein-calorie malnutrition (CMS/HCC) (HCC) (POA: Unknown)   Ambulatory dysfunction (POA: Unknown)   Right upper quadrant pain (POA: Unknown)   Palliative care encounter (POA: Unknown)   Advanced directives, counseling/discussion (POA: Not Applicable)   Pseudomonas pneumonia (HCC) (POA: Unknown)   Muscle weakness (generalized) (POA: Unknown)   Presenting problem on admission:  Michael Robertson is a 79 y.o. male with history of HLD, HTN, atrial fib on eliquis  and bisoprolol , chronic resp failure, COPD, prediabetes, BPH, amongst others is admitted to the emergency room from home with complaints of shortness of breath.  The patient lives at home and is independent for activities of daily living.  The patient states that for the last 2 days he has had ongoing Cough following congestion.  He also endorses flulike symptoms associated with myalgia, fatigue.  He has had subjective fevers and chills, nausea but no emesis.  The patient is on 2 L nasal cannula at home at baseline.  The patient complains of productive cough with dark sputum.  He denies any pleuritic chest pain.  Dizziness, chest pain.  The patient noticed that he has had intermittent tachycardia/palpitations associated with atrial fibrillation as noted on his monitor.  The patient about 2 months ago was hospitalized for pneumonia and was treated with  antibiotics appropriately.  Since then he has had there is 9 chronic cough which had not resolved completely.   Patient has no signs or symptoms of active TB.  Hospital Course:  Viral pneumonia Pseudomonas pneumonia (HCC) Acute on chronic hypoxic respiratory failure (HCC) COPD exacerbation (HCC) - Initially tested positive for flu on 07/15/2024,  with superimposed Pseudomonas pneumonia - intubated on 12/30, got extubated on 1/1 - Reintubated 07/30/2024 due to worsening respiratory failure and respiratory distress,  extubated on 08/01/2024 - Completed tamiflu - sputum culture growing Pseudomonas, patient completed a course of Zosyn  and steroids  -Patient at baseline on 2 L nasal cannula - Patient intolerant to albuterol , reports of getting tachycardia - Follows with pulmonology in [place]   Right upper quadrant pain - Abdominal ultrasound ordered: This demonstrated no acute findings, chronic changes were seen - Liver enzymes normal   Atrial fibrillation with rapid ventricular response (HCC) -Continue Eliquis , Toprol  and amiodarone daily, now rate controlled  Benign prostatic hyperplasia without urinary obstruction -Continue Flomax  and finasteride   Hypertension - On Metoprolol ;  losartan  and hydrochlorothiazide  discontinued    Type 2 diabetes mellitus -A1c 6.3%, not on home insulin, glucose well-controlled  Severe protein-calorie malnutrition (CMS/HCC) (HCC) -Nutrition consulted  Ambulatory dysfunction Muscle weakness (generalized) - Generalized deconditioning from hospitalization - PT OT, rehab recommended  Palliative care encounter Advanced directives, counseling/discussion - Patient is full code, verified by previous physician   Consults:  Cardiology, pulmonology  Procedures: ET intubation  Last Results past 24H  Glucose, Fingerstick BGS (POC)   Collection Time: 08/09/24  5:38 PM  Result Value Ref Range   Glucose (POC) 163 (H) 70 - 115 mg/dL  Glucose, Fingerstick BGS (POC)   Collection Time: 08/09/24  7:47 PM  Result Value Ref Range   Glucose (POC) 145 (H) 70 - 115 mg/dL  Basic metabolic panel   Collection Time: 08/10/24  4:42 AM  Result Value Ref Range   Sodium 135 135 - 145 mmol/L   Potassium 4.2 3.5 - 5.0 mmol/L   Chloride 98 98 - 109 mmol/L   CO2 40 (H) 27 - 32 mmol/L   BUN 19 9 - 20 mg/dL   Glucose 879 (H) 75 - 110 mg/dL   Creatinine 9.02 9.33 - 1.25 mg/dL   Calcium  8.2 (L) 8.4 - 10.2 mg/dL   Anion Gap -3 (L) 3 - 11   Osmolality Calc 283 280 - 297 mosm/kg   BUN/Creatinine Ratio 19.6 ratio   eGFR 79 mL/min/1.28m*2  Magnesium Level   Collection Time: 08/10/24  4:42 AM  Result Value Ref Range   Magnesium 2.0 1.6 - 2.3 mg/dL  CBC w/ differential   Collection Time: 08/10/24  4:43 AM  Result Value Ref Range   WBC Count 12.14 (H) 4.80 - 10.80 10*3/mcL   RBC 2.66 (L) 4.70 - 6.10 10*6/mcL   Hemoglobin 7.8 (L) 14.0 - 18.0 g/dL   Hematocrit 72.9 (L) 57.9 - 52.0 %   MCV 101.5 (H) Males: 80.0-95.0; Females: 80.0-95.0 fL   MCH 29.3 28.0 - 34.0 pg   MCHC 28.9 (L) 32.0 - 36.0 g/dL   RDW 85.6 88.4 - 85.4 %   Platelet Count 324 150 - 450 10*3/mcL   Nucleated RBC % Auto 0.0 <=0.0 %   NRBC Absolute 0.00 <=0.00 10*3/mcL   Neutrophil % 76.4 (H) 40.0 - 64.0 %   Lymphocytes % 7.2 (L) 22.0 - 44.0 %   Monocytes % 9.4 4.0 - 14.0 %   Eosinophils % 2.7 0.0 - 5.0 %   Basophils % 0.2 0.0 - 2.0 %   IG Auto 4.1 (H) 0.1 - 0.7 %   Neutrophils Absolute 9.26 (H) 1.90 - 7.60 10*3/mcL   Lymphocytes Absolute 0.88 (L) 1.10 - 4.80 10*3/mcL   Monocytes Absolute 1.14 0.00 - 1.20 10*3/mcL   Eosinophils Absolute 0.33 0.00 - 0.50 10*3/mcL   Basophils Absolute 0.03 0.00 - 0.20 10*3/mcL   IG Absolute 0.50 (H) 0.00 - 0.10 10*3/mcL  Glucose, Fingerstick BGS (POC)   Collection Time: 08/10/24  6:59 AM  Result Value Ref Range   Glucose (POC) 103 70 - 115 mg/dL  Glucose, Fingerstick BGS (POC)   Collection Time: 08/10/24 11:30 AM  Result Value Ref  Range   Glucose (POC) 163 (H) 70 - 115 mg/dL   Transthoracic Echo (TTE) Complete Result Date: 07/18/2024   Left Ventricle: Size is normal. Normal wall thickness. Normal wall motion. Normal LV systolic function with an approximate EF of 60 - 65%. Grade II: There is a pseudonormal pattern of diastolic function.   Right Ventricle: Size is normal. Normal systolic function.   Left Atrium: Size is normal.   Right Atrium: Size is normal.   Aortic Valve: Normal valve structure. No transvalvular regurgitation. No stenosis.   Mitral Valve: Normal valve structure. Trivial regurgitation. No stenosis.   Tricuspid Valve: Normal valve structure. The estimated RV systolic pressure was not measured in absence of an adequate TR jet. Trivial regurgitation. No stenosis.   Pulmonic Valve: Normal valve structure. No regurgitation. No stenosis.   Extracardiac: The pericardium is normal. No pericardial effusion.   Aorta: Normal sized annulus, sinus of Valsalva and ascending aorta.   IVC/SVC:  Patient is ventilated, cannot use IVC diameter to estimate right atrial pressure.   A complete spectral doppler echocardiogram was performed.   TRANSTHORACIC ECHO (TTE) COMPLETE Result Date: 05/09/2024 This result has an attachment that is not available.    ECHOCARDIOGRAM REPORT   Patient Name:   Akhil Piscopo. Date of Exam: 05/09/2024 Medical Rec #:  [id number]         Height:       71.0 in Accession #:    [id number]        Weight:       150.8 lb Date of Birth:  09-13-1945         BSA:          1.870 m Patient Age:    66 years          BP:           109/76 mmHg Patient Gender: M                 HR:           104 bpm. Exam Location:  Inpatient Procedure: 2D Echo, Cardiac Doppler and Color Doppler (Both Spectral and Color            Flow Doppler were utilized during procedure). Indications:    Atrial fibrillation History:        Patient has no prior history of Echocardiogram examinations.                 COPD; Risk  Factors:Hypertension. CKD. Sonographer:    Philomena Daring Referring Phys: [id number] CARINA M BROWN Sonographer Comments: Technically difficult study due to poor echo windows. Image acquisition challenging due to patient body habitus. IMPRESSIONS  1. Left ventricular ejection fraction, by estimation, is 70 to 75%. The left ventricle has hyperdynamic function. The left ventricle has no regional wall motion abnormalities. Left ventricular diastolic parameters were normal.  2. Right ventricular systolic function is normal. The right ventricular size is normal. Tricuspid regurgitation signal is inadequate for assessing PA pressure.  3. The mitral valve is grossly normal. No evidence of mitral valve regurgitation. No evidence of mitral stenosis.  4. The aortic valve was not well visualized. There is mild calcification of the aortic valve. Aortic valve regurgitation is not visualized. No aortic stenosis is present.  5. The inferior vena cava is normal in size with greater than 50% respiratory variability, suggesting right atrial pressure of 3 mmHg. Comparison(s): No prior Echocardiogram. Conclusion(s)/Recommendation(s): Normal biventricular function without evidence of hemodynamically significant valvular heart disease. FINDINGS  Left Ventricle: Left ventricular ejection fraction, by estimation, is 70 to 75%. The left ventricle has hyperdynamic function. The left ventricle has no regional wall motion abnormalities. The left ventricular internal cavity size was normal in size. There is no left ventricular hypertrophy. Left ventricular diastolic parameters were normal. Right Ventricle: The right ventricular size is normal. No increase in right ventricular wall thickness. Right ventricular systolic function is normal. Tricuspid regurgitation signal is inadequate for assessing PA pressure. Left Atrium: Left atrial size was normal in size. Right Atrium: Right atrial size was normal in size. Pericardium: There is no evidence of  pericardial effusion. Mitral Valve: The mitral valve is grossly normal. No evidence of mitral valve regurgitation. No evidence of mitral valve stenosis. Tricuspid Valve: The tricuspid valve is grossly normal. Tricuspid valve regurgitation is not demonstrated. No evidence of tricuspid stenosis. Aortic Valve: The aortic valve was not well visualized. There is mild calcification of the aortic  valve. Aortic valve regurgitation is not visualized. No aortic stenosis is present. Pulmonic Valve: The pulmonic valve was not well visualized. Pulmonic valve regurgitation is not visualized. No evidence of pulmonic stenosis. Aorta: The aortic root and ascending aorta are structurally normal, with no evidence of dilitation. Venous: The inferior vena cava is normal in size with greater than 50% respiratory variability, suggesting right atrial pressure of 3 mmHg. IAS/Shunts: The atrial septum is grossly normal. LEFT VENTRICLE PLAX 2D LVIDd:         3.50 cm   Diastology LVIDs:         2.40 cm   LV e' medial:    7.18 cm/s LV PW:         1.10 cm   LV E/e' medial:  12.0 LV IVS:        0.90 cm   LV e' lateral:   8.16 cm/s LVOT diam:     2.10 cm   LV E/e' lateral: 10.6 LV SV:         62 LV SV Index:   33 LVOT Area:     3.46 cm RIGHT VENTRICLE             IVC RV S prime:     11.10 cm/s  IVC diam: 2.00 cm TAPSE (M-mode): 2.0 cm LEFT ATRIUM             Index        RIGHT ATRIUM           Index LA diam:        2.50 cm 1.34 cm/m   RA Area:     11.60 cm LA Vol (A2C):   28.4 ml 15.19 ml/m  RA Volume:   25.90 ml  13.85 ml/m LA Vol (A4C):   26.1 ml 13.96 ml/m LA Biplane Vol: 29.0 ml 15.51 ml/m  AORTIC VALVE LVOT Vmax:   118.00 cm/s LVOT Vmean:  77.700 cm/s LVOT VTI:    0.180 m AORTA Ao Root diam: 2.80 cm MITRAL VALVE MV Area (PHT): 4.49 cm    SHUNTS MV Decel Time: 169 msec    Systemic VTI:  0.18 m MV E velocity: 86.40 cm/s  Systemic Diam: 2.10 cm MV A velocity: 97.90 cm/s MV E/A ratio:  0.88 Shelda Bruckner MD Electronically signed  by Shelda Bruckner MD Signature Date/Time: 05/09/2024/11:48:50 AM   Final    No Radiology Results Returned for the last 72h   Physical Exam At Time of Discharge: Temp: 37.2 C (99 F) (01/23 1040) Temp Source: Oral (01/23 1040) Heart Rate: 90 (01/23 1040) Resp: 18 (01/23 1040) BP: 118/57 (01/23 1040) SpO2: 100 % (01/23 1040) MAP (mmHg): 78 (01/23 1040)  GENERAL: Male, no acute stress, pleasant HEENT: Normocephalic, no scleral icterus, conjunctiva clear, moist mucous membranes RESPIRATORY: Improved air movement, normal work of breathing on nasal cannula CARDIAC: Regular rate and rhythm ABDOMEN: Soft, no distension, no tenderness, no guarding, normal bowel sounds EXTREMITIES: No cyanosis MUSCULOSKELETAL: Decreased muscular bulk VASCULAR: Normal peripheral pulses bilateral upper extremities NEUROLOGIC: Alert, oriented to person/place/time, normal speech SKIN: Warm, dry PSYCHIATRIC: Normal mood, normal affect    Discharge medications:     Medication List     STOP taking these medications    * bisoprolol  5 mg tablet Commonly known as: Zebeta    * hydroCHLOROthiazide  12.5 mg tablet   * montelukast  10 mg tablet Commonly known as: Singulair    * omeprazole  40 mg DR capsule Commonly known as: PriLOSEC   * predniSONE  10  mg tablet Commonly known as: Deltasone    * traZODone  100 mg tablet Commonly known as: Desyrel        TAKE these medications        Morning Around Noon Evening Bedtime As Needed  * amiodarone 200 mg tablet Commonly known as: Pacerone Take 1 tablet (200 mg) by mouth in the morning. Last Hospital Dose: 200 mg on August 10, 2024  8:57 AM  * 1 tablet        apixaban  5 mg tablet Commonly known as: Eliquis  Take 5 mg by mouth in the morning and at bedtime. Last Hospital Dose: 5 mg on August 10, 2024  8:56 AM  * 5 mg    * 5 mg     finasteride  5 mg tablet Commonly known as: Proscar  Take 5 mg by mouth in the morning. Do not crush, chew, or  split. Last Hospital Dose: 5 mg on August 10, 2024  8:57 AM  * 5 mg       * metoprolol  succinate XL 100 mg 24 hr tablet Commonly known as: Toprol -XL Start taking on: August 11, 2024 Take 1 tablet (100 mg) by mouth in the morning. Do not crush or chew. Last Hospital Dose: 100 mg on August 10, 2024  8:57 AM  * Wait until Jan 24   1 tablet       * mirtazapine 15 mg tablet Commonly known as: Remeron Take 1 tablet (15 mg) by mouth at bedtime. Last Hospital Dose: 15 mg on August 09, 2024  8:22 PM     * 1 tablet     mometasone  220 mcg/ actuation (60) aerosol powdr breath activated Inhale 2 puffs at bedtime. Last Hospital Dose: 1 puff on August 10, 2024  8:58 AM     * 2 puffs     multivitamin tablet Take 1 tablet by mouth in the morning. Last Hospital Dose: Ask about last dose  * 1 tablet       * polyethylene glycol 17 gram packet Commonly known as: Glycolax Take 17 g by mouth in the morning and at bedtime. Last Hospital Dose: 17 g on August 09, 2024  8:22 PM  * 17 g    * 17 g     rosuvastatin  20 mg tablet Commonly known as: Crestor  Take 20 mg by mouth at bedtime. Last Hospital Dose: Ask about last dose     * 20 mg    * tamsulosin  0.4 mg 24 hr capsule Commonly known as: Flomax  Start taking on: August 11, 2024 Take 2 capsules (0.8 mg) by mouth in the morning. Last Hospital Dose: 0.8 mg on August 10, 2024  8:57 AM What changed:  how much to take when to take this  * Wait until Jan 24   2 capsules        tiotropium-olodateroL 2.5-2.5 mcg/actuation mist inhaler Commonly known as: Stiolto Respimat  Inhale 2 Inhalation  in the morning. Last Hospital Dose: 2 Inhalation  on August 10, 2024  8:58 AM  * 2 Inhalation              Current Discharge Medication List     STOP taking these medications     bisoprolol  (Zebeta ) 5 mg tablet Comments:  Reason for Stopping:       hydroCHLOROthiazide  12.5 mg tablet Comments:  Reason for Stopping:        montelukast  (Singulair ) 10 mg tablet Comments:  Reason for Stopping:       omeprazole  (PriLOSEC) 40  mg DR capsule Comments:  Reason for Stopping:       traZODone  (Desyrel ) 100 mg tablet Comments:  Reason for Stopping:       losartan  (Cozaar ) 100 mg tablet Comments:  Reason for Stopping:       pravastatin  (Pravachol ) 40 mg tablet Comments:  Reason for Stopping:       predniSONE  (Deltasone ) 10 mg tablet Comments:  Reason for Stopping:           Outpatient Follow-Up No future appointments.     Discharge disposition:  Rehab   Discharge condition:  Stable   Time spent on discharge:  Greater than 35 minutes were spent coordinating this discharge, including time spent counseling patient on discharge diagnoses and recommended follow ups, collaborating with case management, chart review and medication reconciliation.  *Some images could not be shown.

## 2024-08-10 NOTE — Nursing Note (Signed)
 Attempted report [phone] without success.

## 2024-08-11 NOTE — Progress Notes (Signed)
 Patient: Michael Robertson  Date: 08/11/2024 Time: 9:01 AM  Case Manager Notes  CM received call this am from weekday CM to make aware that pt did not dc to [facility] on 1/23 2/2 ambo company was not able to provide transport to facility. CM informed this CM that [employer] will pick up pt at [place] and transport pt to [facility] today at 0945.   CM called and spoke with Grady at [facility] [phone]) to make aware of above. Pt will go to [address] at facility. CM sent message to care team to make aware of same. Pt's nurse was given transport packet on 1/23 and can call report today to [phone].  Signed by: Montie Dresser, RN

## 2024-08-11 NOTE — Nursing Note (Signed)
 Pt discharged via transport with belongings. PIV removed.

## 2024-08-11 NOTE — Care Plan (Signed)
 The patient's goals for the shift include Maintain safety and comfort  The clinical goals for the shift include Monitor vitals and labs

## 2024-08-27 ENCOUNTER — Ambulatory Visit (HOSPITAL_COMMUNITY)

## 2024-09-05 ENCOUNTER — Ambulatory Visit: Admitting: Pulmonary Disease

## 2024-12-17 ENCOUNTER — Encounter
